# Patient Record
Sex: Male | Born: 1975 | Race: Black or African American | Hispanic: No | Marital: Married | Smoking: Current every day smoker
Health system: Southern US, Community
[De-identification: ages and names within clinical notes are randomized; demographics above are authoritative.]

## PROBLEM LIST (undated history)

## (undated) DIAGNOSIS — I10 Essential (primary) hypertension: Secondary | ICD-10-CM

## (undated) DIAGNOSIS — E119 Type 2 diabetes mellitus without complications: Secondary | ICD-10-CM

---

## 2017-01-02 ENCOUNTER — Emergency Department (HOSPITAL_COMMUNITY)
Admission: EM | Admit: 2017-01-02 | Discharge: 2017-01-02 | Disposition: A | Discharge status: Home / Self Care | Payer: Self-pay | Attending: Emergency Medicine | Admitting: Emergency Medicine

## 2017-01-02 ENCOUNTER — Encounter (HOSPITAL_COMMUNITY): Payer: Self-pay | Admitting: Emergency Medicine

## 2017-01-02 DIAGNOSIS — R51 Headache: Secondary | ICD-10-CM

## 2017-01-02 DIAGNOSIS — L0291 Cutaneous abscess, unspecified: Secondary | ICD-10-CM

## 2017-01-02 DIAGNOSIS — R030 Elevated blood-pressure reading, without diagnosis of hypertension: Secondary | ICD-10-CM

## 2017-01-02 DIAGNOSIS — J3489 Other specified disorders of nose and nasal sinuses: Secondary | ICD-10-CM | POA: Insufficient documentation

## 2017-01-02 DIAGNOSIS — R519 Headache, unspecified: Secondary | ICD-10-CM

## 2017-01-02 MED ORDER — OXYMETAZOLINE HCL 0.05 % NA SOLN
1.0000 | Freq: Once | NASAL | Status: AC
  Administered 2017-01-02: 1 via NASAL
  Filled 2017-01-02: qty 15

## 2017-01-02 MED ORDER — SULFAMETHOXAZOLE-TRIMETHOPRIM 800-160 MG PO TABS: 1.0000 | ORAL_TABLET | Freq: Two times a day (BID) | ORAL | 0 refills | Status: AC

## 2017-01-02 NOTE — Discharge Instructions (Signed)
Take the medication as directed.  Only use the nasal spray one spray in each nostril for 3 days. Follow up with [facility].  Return here as needed.

## 2017-01-02 NOTE — ED Triage Notes (Signed)
Pt here for nasal congestion and boils to knee

## 2017-01-02 NOTE — ED Provider Notes (Signed)
[facility]-EMERGENCY DEPT Provider Note   CSN: [id number] Arrival date & time: 01/02/17  4098   By signing my name below, I, Clarisse Gouge, attest that this documentation has been prepared under the direction and in the presence of [facility], FNP. Electronically Signed: Clarisse Gouge, Scribe. 01/02/17. 9:08 PM.   History   Chief Complaint Chief Complaint  Patient presents with  . Nasal Congestion   The history is provided by the patient and medical records. No language interpreter was used.    HPI Comments: Sean Foster is a 41 y.o. male who presents to the Emergency Department complaining of off and on left sided headache x 4 weeks. He notes associated sinus pain and pressure, nasal congestion, and nasal drainage. He notes he has taken tylenol, aleve and ibuprofen with temporary relief. Pt denies cough and back pain. No PCP noted.  Pt also notes recurring bumps to multiple areas on the body over the past 5 days. He reports bumps to the front of the left knee, outside of the right thigh, behind the right ear and on the left hip with drainage.  History reviewed. No pertinent past medical history.  There are no active problems to display for this patient.   History reviewed. No pertinent surgical history.     Home Medications    Prior to Admission medications   Medication Sig Start Date End Date Taking? Authorizing Provider  sulfamethoxazole-trimethoprim (BACTRIM DS,SEPTRA DS) 800-160 MG tablet Take 1 tablet by mouth 2 (two) times daily. 01/02/17 01/12/17  Dianelys Scinto Orlene Och, NP    Family History History reviewed. No pertinent family history.  Social History Social History  Substance Use Topics  . Smoking status: Never Smoker  . Smokeless tobacco: Never Used  . Alcohol use No     Allergies   Patient has no allergy information on record.   Review of Systems Review of Systems  Constitutional: Negative for chills and fever.  HENT: Positive for congestion, postnasal drip,  rhinorrhea, sinus pain and sinus pressure.   Eyes: Negative for redness.  Respiratory: Negative for cough.   Gastrointestinal: Negative for diarrhea, nausea and vomiting.  Genitourinary: Negative for dysuria.  Musculoskeletal: Negative for back pain.  Skin: Negative for rash.  Neurological: Positive for headaches.  Psychiatric/Behavioral: Negative for confusion.  All other systems reviewed and are negative.    Physical Exam Updated Vital Signs BP (!) 157/104 (BP Location: Left Arm)   Pulse 76   Temp 98 F (36.7 C) (Oral)   Resp 16   Ht 5\' 11"  (1.803 m)   Wt 117.9 kg   SpO2 100%   BMI 36.26 kg/m   Physical Exam  Constitutional: He is oriented to person, place, and time. Vital signs are normal. He appears well-developed and well-nourished.  Non-toxic appearance. No distress.  Afebrile, nontoxic, NAD  HENT:  Head: Normocephalic and atraumatic.  Right Ear: Tympanic membrane normal.  Left Ear: Tympanic membrane normal.  Nose: Mucosal edema and rhinorrhea present. Left sinus exhibits frontal sinus tenderness.  Mouth/Throat: Uvula is midline and mucous membranes are normal. No oropharyngeal exudate, posterior oropharyngeal edema or posterior oropharyngeal erythema.  Nasal mucosa is swollen with erythema noted.  Eyes: Conjunctivae and EOM are normal. Right eye exhibits no discharge. Left eye exhibits no discharge.  Neck: Normal range of motion. Neck supple. No Brudzinski's sign and no Kernig's sign noted.  FROM without pain. No meningeal signs  Cardiovascular: Normal rate, regular rhythm and intact distal pulses.   Pulses:  Radial pulses are 2+ on the right side, and 2+ on the left side.  Pulmonary/Chest: Effort normal. No respiratory distress. He has no wheezes. He has no rales.  Abdominal: Soft. Normal appearance. There is no tenderness.  Musculoskeletal: Normal range of motion.  Steady gait; No foot drag; Stands on one foot without issue; Negative Romberg; grip strength  equal  Neurological: He is alert and oriented to person, place, and time. He has normal strength.  Reflexes symmetric and normal.  Skin: Skin is warm, dry and intact. No rash noted.  Raised areas to the anterior left knee, left laterial thigh and the right hip; Small amount of drainage noted with crust surrounding to raised areas.  Psychiatric: He has a normal mood and affect. His behavior is normal.  Nursing note and vitals reviewed.    ED Treatments / Results  DIAGNOSTIC STUDIES: Oxygen Saturation is 100% on RA, normal by my interpretation.    COORDINATION OF CARE: 9:04 PM Discussed treatment plan with pt at bedside and pt agreed to plan. Will order medications. Pt advised to return if symptoms worsen or persist. Pt further advised to pursue a PCP.  Labs (all labs ordered are listed, but only abnormal results are displayed) Labs Reviewed - No data to display   Radiology No results found.  Procedures Procedures (including critical care time)  Medications Ordered in ED Medications  oxymetazoline (AFRIN) 0.05 % nasal spray 1 spray (1 spray Each Nare Given 01/02/17 2124)     Initial Impression / Assessment and Plan / ED Course  I have reviewed the triage vital signs and the nursing notes. I personally performed the services described in this documentation, which was scribed in my presence. The recorded information has been reviewed and is accurate.    Final Clinical Impressions(s) / ED Diagnoses  41 y.o. male with sinus headache left and multiple infected skin areas stable for d/c without fever and does not appear toxic. Appears stable for d/c. Return precautions given.  Encouraged to f/u with PCP for recheck of the areas and BP check.   Final diagnoses:  Sinus headache  Abscess  Elevated BP without diagnosis of hypertension    New Prescriptions Discharge Medication List as of 01/02/2017  9:14 PM    START taking these medications   Details  sulfamethoxazole-trimethoprim  (BACTRIM DS,SEPTRA DS) 800-160 MG tablet Take 1 tablet by mouth 2 (two) times daily., Starting Thu 01/02/2017, Until Sun 01/12/2017, Print         JewettHope M Fawaz Borquez, NP 01/03/17 [id number]    Shaune Pollackameron Isaacs, MD 01/05/17 [phone]

## 2017-06-25 ENCOUNTER — Emergency Department (HOSPITAL_COMMUNITY)
Admission: EM | Admit: 2017-06-25 | Discharge: 2017-06-25 | Disposition: A | Discharge status: Home / Self Care | Payer: No Typology Code available for payment source | Attending: Emergency Medicine | Admitting: Emergency Medicine

## 2017-06-25 ENCOUNTER — Emergency Department (HOSPITAL_COMMUNITY): Payer: No Typology Code available for payment source

## 2017-06-25 ENCOUNTER — Encounter (HOSPITAL_COMMUNITY): Payer: Self-pay | Admitting: Emergency Medicine

## 2017-06-25 DIAGNOSIS — T148XXA Other injury of unspecified body region, initial encounter: Secondary | ICD-10-CM | POA: Diagnosis not present

## 2017-06-25 DIAGNOSIS — S6991XA Unspecified injury of right wrist, hand and finger(s), initial encounter: Secondary | ICD-10-CM | POA: Diagnosis present

## 2017-06-25 DIAGNOSIS — Y92410 Unspecified street and highway as the place of occurrence of the external cause: Secondary | ICD-10-CM | POA: Insufficient documentation

## 2017-06-25 DIAGNOSIS — Y999 Unspecified external cause status: Secondary | ICD-10-CM | POA: Diagnosis not present

## 2017-06-25 DIAGNOSIS — R0789 Other chest pain: Secondary | ICD-10-CM | POA: Insufficient documentation

## 2017-06-25 DIAGNOSIS — Y9389 Activity, other specified: Secondary | ICD-10-CM | POA: Diagnosis not present

## 2017-06-25 DIAGNOSIS — R030 Elevated blood-pressure reading, without diagnosis of hypertension: Secondary | ICD-10-CM

## 2017-06-25 MED ORDER — MELOXICAM 15 MG PO TABS: 15.0000 mg | ORAL_TABLET | Freq: Every day | ORAL | 0 refills | Status: DC

## 2017-06-25 MED ORDER — BACLOFEN 10 MG PO TABS: 10.0000 mg | ORAL_TABLET | Freq: Three times a day (TID) | ORAL | 0 refills | Status: DC

## 2017-06-25 NOTE — Discharge Instructions (Signed)
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
## 2018-02-24 ENCOUNTER — Emergency Department (HOSPITAL_COMMUNITY)
Admission: EM | Admit: 2018-02-24 | Discharge: 2018-02-24 | Disposition: A | Discharge status: Home / Self Care | Payer: BLUE CROSS/BLUE SHIELD | Attending: Emergency Medicine | Admitting: Emergency Medicine

## 2018-02-24 ENCOUNTER — Encounter (HOSPITAL_COMMUNITY): Payer: Self-pay | Admitting: Emergency Medicine

## 2018-02-24 ENCOUNTER — Emergency Department (HOSPITAL_COMMUNITY): Payer: BLUE CROSS/BLUE SHIELD

## 2018-02-24 DIAGNOSIS — E109 Type 1 diabetes mellitus without complications: Secondary | ICD-10-CM | POA: Diagnosis not present

## 2018-02-24 DIAGNOSIS — Z7984 Long term (current) use of oral hypoglycemic drugs: Secondary | ICD-10-CM | POA: Insufficient documentation

## 2018-02-24 DIAGNOSIS — IMO0002 Reserved for concepts with insufficient information to code with codable children: Secondary | ICD-10-CM

## 2018-02-24 DIAGNOSIS — R51 Headache: Secondary | ICD-10-CM | POA: Diagnosis present

## 2018-02-24 DIAGNOSIS — J019 Acute sinusitis, unspecified: Secondary | ICD-10-CM | POA: Insufficient documentation

## 2018-02-24 DIAGNOSIS — E1065 Type 1 diabetes mellitus with hyperglycemia: Secondary | ICD-10-CM

## 2018-02-24 DIAGNOSIS — I10 Essential (primary) hypertension: Secondary | ICD-10-CM | POA: Diagnosis not present

## 2018-02-24 DIAGNOSIS — J329 Chronic sinusitis, unspecified: Secondary | ICD-10-CM

## 2018-02-24 DIAGNOSIS — Z79899 Other long term (current) drug therapy: Secondary | ICD-10-CM | POA: Insufficient documentation

## 2018-02-24 LAB — CBC WITH DIFFERENTIAL/PLATELET
Basophils Absolute: 0 10*3/uL (ref 0.0–0.1)
Basophils Relative: 0 %
Eosinophils Absolute: 0 10*3/uL (ref 0.0–0.7)
Eosinophils Relative: 1 %
HCT: 41.7 % (ref 39.0–52.0)
Hemoglobin: 13.3 g/dL (ref 13.0–17.0)
Lymphocytes Relative: 44 %
Lymphs Abs: 1.8 10*3/uL (ref 0.7–4.0)
MCH: 22.8 pg — ABNORMAL LOW (ref 26.0–34.0)
MCHC: 31.9 g/dL (ref 30.0–36.0)
MCV: 71.4 fL — ABNORMAL LOW (ref 78.0–100.0)
Monocytes Absolute: 0.4 10*3/uL (ref 0.1–1.0)
Monocytes Relative: 9 %
Neutro Abs: 2 10*3/uL (ref 1.7–7.7)
Neutrophils Relative %: 46 %
Platelets: 193 10*3/uL (ref 150–400)
RBC: 5.84 MIL/uL — ABNORMAL HIGH (ref 4.22–5.81)
RDW: 13.9 % (ref 11.5–15.5)
WBC: 4.2 10*3/uL (ref 4.0–10.5)

## 2018-02-24 LAB — BASIC METABOLIC PANEL
Anion gap: 7 (ref 5–15)
BUN: 13 mg/dL (ref 6–20)
CO2: 28 mmol/L (ref 22–32)
Calcium: 9.4 mg/dL (ref 8.9–10.3)
Chloride: 100 mmol/L — ABNORMAL LOW (ref 101–111)
Creatinine, Ser: 0.94 mg/dL (ref 0.61–1.24)
GFR calc Af Amer: >60 mL/min (ref >60)
GFR calc non Af Amer: >60 mL/min (ref >60)
Glucose, Bld: 245 mg/dL — ABNORMAL HIGH (ref 65–99)
Potassium: 4.4 mmol/L (ref 3.5–5.1)
Sodium: 135 mmol/L (ref 135–145)

## 2018-02-24 MED ORDER — AMLODIPINE BESYLATE 5 MG PO TABS: 5.0000 mg | ORAL_TABLET | Freq: Every day | ORAL | 1 refills | Status: DC

## 2018-02-24 MED ORDER — KETOROLAC TROMETHAMINE 15 MG/ML IJ SOLN
15.0000 mg | Freq: Once | INTRAMUSCULAR | Status: AC
  Administered 2018-02-24: 15 mg via INTRAVENOUS
  Filled 2018-02-24: qty 1

## 2018-02-24 MED ORDER — DIPHENHYDRAMINE HCL 50 MG/ML IJ SOLN
25.0000 mg | Freq: Once | INTRAMUSCULAR | Status: AC
  Administered 2018-02-24: 25 mg via INTRAVENOUS
  Filled 2018-02-24: qty 1

## 2018-02-24 MED ORDER — METOCLOPRAMIDE HCL 5 MG/ML IJ SOLN
10.0000 mg | Freq: Once | INTRAMUSCULAR | Status: AC
  Administered 2018-02-24: 10 mg via INTRAVENOUS
  Filled 2018-02-24: qty 2

## 2018-02-24 MED ORDER — METFORMIN HCL ER 750 MG PO TB24: 750.0000 mg | ORAL_TABLET | Freq: Every day | ORAL | 1 refills | Status: DC

## 2018-02-24 MED ORDER — SODIUM CHLORIDE 0.9 % IV BOLUS
1000.0000 mL | Freq: Once | INTRAVENOUS | Status: AC
  Administered 2018-02-24: 1000 mL via INTRAVENOUS

## 2018-02-24 NOTE — ED Notes (Signed)
Patient transported to CT 

## 2018-02-24 NOTE — ED Notes (Signed)
ED Provider at bedside. 

## 2018-02-24 NOTE — ED Triage Notes (Signed)
Pt reports for past 5 days has headache when he wakes up that will eventually go away but continues to come back. Reports intermittent blurred vision, denies n/v or being affected by sounds or lights.

## 2018-02-26 NOTE — ED Provider Notes (Signed)
[facility]-EMERGENCY DEPT Provider Note   CSN: [id number] Arrival date & time: 02/24/18  0816     History   Chief Complaint Chief Complaint  Patient presents with  . Headache    HPI Sean Foster is a 42 y.o. male.  HPI   42 year old male with headache.  Onset about 5 days ago.  Pain is intermittent but more intense frequent.  Pain is diffuse but worse in the frontal region.  Seasonally worse in the morning.  Will light up throughout the day.  No appreciable exacerbating relieving factors otherwise.  Mild blurred vision.  No recent trauma.  No fevers or chills.  No acute numbness, tingling or focal loss of strength.  No nausea or vomiting.  History reviewed. No pertinent past medical history.  There are no active problems to display for this patient.   History reviewed. No pertinent surgical history.      Home Medications    Prior to Admission medications   Medication Sig Start Date End Date Taking? Authorizing Provider  Naproxen Sodium (ALEVE PO) Take 2 tablets by mouth daily as needed (pain).   Yes [provider]  amLODipine (NORVASC) 5 MG tablet Take 1 tablet (5 mg total) by mouth daily. 02/24/18   Raeford RazorKohut, Elianah Karis, MD  baclofen (LIORESAL) 10 MG tablet Take 1 tablet (10 mg total) by mouth 3 (three) times daily. Patient not taking: Reported on 02/24/2018 06/25/17   Arthor CaptainHarris, Abigail, PA-C  meloxicam (MOBIC) 15 MG tablet Take 1 tablet (15 mg total) by mouth daily. Take 1 daily with food. Patient not taking: Reported on 02/24/2018 06/25/17   Arthor CaptainHarris, Abigail, PA-C  metFORMIN (GLUCOPHAGE-XR) 750 MG 24 hr tablet Take 1 tablet (750 mg total) by mouth daily with breakfast. 02/24/18   Raeford RazorKohut, Lleyton Byers, MD    Family History No family history on file.  Social History Social History   Tobacco Use  . Smoking status: Never Smoker  . Smokeless tobacco: Never Used  Substance Use Topics  . Alcohol use: Yes  . Drug use: No     Allergies    Chlorothen   Review of Systems Review of Systems All systems reviewed and negative, other than as noted in HPI.   Physical Exam Updated Vital Signs BP (!) 153/94   Pulse 64   Temp 98.5 F (36.9 C) (Oral)   Resp 16   Ht 5\' 11"  (1.803 m)   Wt 113.4 kg (250 lb)   SpO2 99%   BMI 34.87 kg/m   Physical Exam  Constitutional: He is oriented to person, place, and time. He appears well-developed and well-nourished. No distress.  HENT:  Head: Normocephalic and atraumatic.  Eyes: Conjunctivae are normal. Right eye exhibits no discharge. Left eye exhibits no discharge.  Neck: Neck supple.  Cardiovascular: Normal rate, regular rhythm and normal heart sounds. Exam reveals no gallop and no friction rub.  No murmur heard. Pulmonary/Chest: Effort normal and breath sounds normal. No respiratory distress.  Abdominal: Soft. He exhibits no distension. There is no tenderness.  Musculoskeletal: He exhibits no edema or tenderness.  Neurological: He is alert and oriented to person, place, and time.  Speech clear.  Content appropriate.  Follows commands.  Cranial nerves II through XII intact.  Strength is 5 out of 5 bilateral upper and lower extremities.  Good finger-nose testing bilaterally.  Neck is supple.  Skin: Skin is warm and dry.  Psychiatric: He has a normal mood and affect. His behavior is normal. Thought content normal.  Nursing note and vitals reviewed.    ED Treatments / Results  Labs (all labs ordered are listed, but only abnormal results are displayed) Labs Reviewed  CBC WITH DIFFERENTIAL/PLATELET - Abnormal; Notable for the following components:      Result Value   RBC 5.84 (*)    MCV 71.4 (*)    MCH 22.8 (*)    All other components within normal limits  BASIC METABOLIC PANEL - Abnormal; Notable for the following components:   Chloride 100 (*)    Glucose, Bld 245 (*)    All other components within normal limits    EKG None  Radiology No results  found.  Procedures Procedures (including critical care time)  Medications Ordered in ED Medications  sodium chloride 0.9 % bolus 1,000 mL (0 mLs Intravenous Stopped 02/24/18 1055)  ketorolac (TORADOL) 15 MG/ML injection 15 mg (15 mg Intravenous Given 02/24/18 0904)  metoCLOPramide (REGLAN) injection 10 mg (10 mg Intravenous Given 02/24/18 0903)  diphenhydrAMINE (BENADRYL) injection 25 mg (25 mg Intravenous Given 02/24/18 0904)     Initial Impression / Assessment and Plan / ED Course  I have reviewed the triage vital signs and the nursing notes.  Pertinent labs & imaging results that were available during my care of the patient were reviewed by me and considered in my medical decision making (see chart for details).     42 year old male with headache.  I doubt meningitis, head bleed, clot, or other emergent pathology.  Once a diabetic.  Will start on metformin.  Also noted to be hypertensive he has been told this several times previously.  We will start him on a low-dose of Norvasc.  Discussed need to establish PCP soon as he can.  Final Clinical Impressions(s) / ED Diagnoses   Final diagnoses:  Sinusitis, unspecified chronicity, unspecified location  New onset type 1 diabetes mellitus, uncontrolled (HCC)  Hypertension, unspecified type    ED Discharge Orders        Ordered    metFORMIN (GLUCOPHAGE-XR) 750 MG 24 hr tablet  Daily with breakfast     02/24/18 1039    amLODipine (NORVASC) 5 MG tablet  Daily     02/24/18 1039       Raeford Razor, MD 02/26/18 1721

## 2019-06-16 ENCOUNTER — Other Ambulatory Visit: Payer: Self-pay

## 2019-06-16 ENCOUNTER — Emergency Department (HOSPITAL_COMMUNITY)
Admission: EM | Admit: 2019-06-16 | Discharge: 2019-06-16 | Disposition: A | Discharge status: Home / Self Care | Payer: BLUE CROSS/BLUE SHIELD | Attending: Emergency Medicine | Admitting: Emergency Medicine

## 2019-06-16 ENCOUNTER — Encounter (HOSPITAL_COMMUNITY): Payer: Self-pay | Admitting: Emergency Medicine

## 2019-06-16 DIAGNOSIS — Z7984 Long term (current) use of oral hypoglycemic drugs: Secondary | ICD-10-CM | POA: Insufficient documentation

## 2019-06-16 DIAGNOSIS — E119 Type 2 diabetes mellitus without complications: Secondary | ICD-10-CM | POA: Diagnosis not present

## 2019-06-16 DIAGNOSIS — Z79899 Other long term (current) drug therapy: Secondary | ICD-10-CM | POA: Insufficient documentation

## 2019-06-16 DIAGNOSIS — R42 Dizziness and giddiness: Secondary | ICD-10-CM | POA: Diagnosis not present

## 2019-06-16 DIAGNOSIS — I10 Essential (primary) hypertension: Secondary | ICD-10-CM | POA: Insufficient documentation

## 2019-06-16 HISTORY — DX: Type 2 diabetes mellitus without complications: E11.9

## 2019-06-16 HISTORY — DX: Essential (primary) hypertension: I10

## 2019-06-16 LAB — CBG MONITORING, ED: Glucose-Capillary: 260 mg/dL — ABNORMAL HIGH (ref 70–99)

## 2019-06-16 LAB — CBC
HCT: 46.3 % (ref 39.0–52.0)
Hemoglobin: 13.9 g/dL (ref 13.0–17.0)
MCH: 21.8 pg — ABNORMAL LOW (ref 26.0–34.0)
MCHC: 30 g/dL (ref 30.0–36.0)
MCV: 72.7 fL — ABNORMAL LOW (ref 80.0–100.0)
Platelets: 216 10*3/uL (ref 150–400)
RBC: 6.37 MIL/uL — ABNORMAL HIGH (ref 4.22–5.81)
RDW: 14.9 % (ref 11.5–15.5)
WBC: 3.7 10*3/uL — ABNORMAL LOW (ref 4.0–10.5)
nRBC: 0 % (ref 0.0–0.2)

## 2019-06-16 LAB — BASIC METABOLIC PANEL
Anion gap: 9 (ref 5–15)
BUN: 13 mg/dL (ref 6–20)
CO2: 24 mmol/L (ref 22–32)
Calcium: 9.6 mg/dL (ref 8.9–10.3)
Chloride: 106 mmol/L (ref 98–111)
Creatinine, Ser: 0.8 mg/dL (ref 0.61–1.24)
GFR calc Af Amer: >60 mL/min (ref >60)
GFR calc non Af Amer: >60 mL/min (ref >60)
Glucose, Bld: 274 mg/dL — ABNORMAL HIGH (ref 70–99)
Potassium: 4 mmol/L (ref 3.5–5.1)
Sodium: 139 mmol/L (ref 135–145)

## 2019-06-16 MED ORDER — HYDROCHLOROTHIAZIDE 25 MG PO TABS: 25.0000 mg | ORAL_TABLET | Freq: Every day | ORAL | 1 refills | Status: DC

## 2019-06-16 MED ORDER — AMLODIPINE BESYLATE 5 MG PO TABS
5.0000 mg | ORAL_TABLET | Freq: Once | ORAL | Status: AC
  Administered 2019-06-16: 5 mg via ORAL
  Filled 2019-06-16: qty 1

## 2019-06-16 NOTE — ED Provider Notes (Signed)
[facility]   CSN: [id number] Arrival date & time: 06/16/19  0708     History   Chief Complaint Chief Complaint  Patient presents with  . Headache  . Dizziness    HPI Sean Foster is a 43 y.o. male.     Patient with history of hypertension and diabetes presents to the emergency department today with an elevated blood pressure reading.  Patient states that he woke up this morning and felt dizzy.  He denies feeling lightheaded or vertigo tenderness.  He states that when he stood up he had to sit back down for a moment.  After that time he was able to get up and use the restroom without any difficulty.  Patient checks his blood pressure after this incident which he found to be in the 180 systolic range. Patient called EMS who transported him to the hospital.  BP 222/130 per EMS.  by He reports having an intermittent headache over the past 3 days.  This is minor.  Patient denies signs of stroke including: facial droop, slurred speech, aphasia, weakness/numbness in extremities, imbalance/trouble walking.  Patient reports discontinuing previous metformin and blood pressure medication because he feels it decreases his sex drive.  He states that he also has concerns with erectile dysfunction.  He states that he saw an endocrinologist yesterday who did not restart any medications however referred him to a primary care doctor.  Patient states he does not want to take his previous blood pressure medication due to the side effects.      Past Medical History:  Diagnosis Date  . Diabetes mellitus without complication (HCC)   . Hypertension     There are no active problems to display for this patient.   History reviewed. No pertinent surgical history.      Home Medications    Prior to Admission medications   Medication Sig Start Date End Date Taking? Authorizing Provider  amLODipine (NORVASC) 5 MG tablet Take 1 tablet (5 mg total) by  mouth daily. 02/24/18   Raeford RazorKohut, Stephen, MD  baclofen (LIORESAL) 10 MG tablet Take 1 tablet (10 mg total) by mouth 3 (three) times daily. Patient not taking: Reported on 02/24/2018 06/25/17   Arthor CaptainHarris, Abigail, PA-C  meloxicam (MOBIC) 15 MG tablet Take 1 tablet (15 mg total) by mouth daily. Take 1 daily with food. Patient not taking: Reported on 02/24/2018 06/25/17   Arthor CaptainHarris, Abigail, PA-C  metFORMIN (GLUCOPHAGE-XR) 750 MG 24 hr tablet Take 1 tablet (750 mg total) by mouth daily with breakfast. 02/24/18   Raeford RazorKohut, Stephen, MD  Naproxen Sodium (ALEVE PO) Take 2 tablets by mouth daily as needed (pain).    [provider]    Family History History reviewed. No pertinent family history.  Social History Social History   Tobacco Use  . Smoking status: Never Smoker  . Smokeless tobacco: Never Used  Substance Use Topics  . Alcohol use: Yes    Comment: 1 drink per week  . Drug use: No     Allergies   Chlorothen   Review of Systems Review of Systems  Constitutional: Negative for diaphoresis and fever.  HENT: Negative for rhinorrhea and sore throat.   Eyes: Negative for redness.  Respiratory: Negative for cough and shortness of breath.   Cardiovascular: Negative for chest pain, palpitations and leg swelling.  Gastrointestinal: Negative for abdominal pain, diarrhea, nausea and vomiting.  Genitourinary: Negative for dysuria.  Musculoskeletal: Negative for back pain, myalgias and neck pain.  Skin: Negative for rash.  Neurological: Positive for dizziness and headaches. Negative for syncope and light-headedness.  Psychiatric/Behavioral: The patient is not nervous/anxious.      Physical Exam Updated Vital Signs BP (!) 181/111 (BP Location: Right Arm)   Pulse 70   Temp 98.2 F (36.8 C) (Oral)   Resp 10   Ht 5\' 10"  (1.778 m)   Wt 99.8 kg   SpO2 100%   BMI 31.57 kg/m   Physical Exam Vitals signs and nursing note reviewed.  Constitutional:      Appearance: He is well-developed. He is  not diaphoretic.  HENT:     Head: Normocephalic and atraumatic.     Right Ear: Tympanic membrane, ear canal and external ear normal.     Left Ear: Tympanic membrane, ear canal and external ear normal.     Nose: Nose normal.     Mouth/Throat:     Mouth: Mucous membranes are not dry.     Pharynx: Uvula midline.  Eyes:     General: Lids are normal.     Conjunctiva/sclera: Conjunctivae normal.     Pupils: Pupils are equal, round, and reactive to light.  Neck:     Musculoskeletal: Normal range of motion and neck supple. No muscular tenderness.     Vascular: Normal carotid pulses. No carotid bruit or JVD.     Trachea: Trachea normal. No tracheal deviation.  Cardiovascular:     Rate and Rhythm: Normal rate and regular rhythm.     Pulses: No decreased pulses.     Heart sounds: Normal heart sounds, S1 normal and S2 normal. Heart sounds not distant. No murmur.  Pulmonary:     Effort: Pulmonary effort is normal. No respiratory distress.     Breath sounds: Normal breath sounds. No wheezing.  Chest:     Chest wall: No tenderness.  Abdominal:     General: Bowel sounds are normal.     Palpations: Abdomen is soft.     Tenderness: There is no abdominal tenderness. There is no guarding or rebound.  Musculoskeletal: Normal range of motion.     Cervical back: He exhibits normal range of motion, no tenderness and no bony tenderness.  Skin:    General: Skin is warm and dry.     Coloration: Skin is not pale.  Neurological:     Mental Status: He is alert and oriented to person, place, and time.     GCS: GCS eye subscore is 4. GCS verbal subscore is 5. GCS motor subscore is 6.     Cranial Nerves: No cranial nerve deficit.     Sensory: No sensory deficit.     Motor: No abnormal muscle tone.     Coordination: Coordination normal.     Gait: Gait normal.     Deep Tendon Reflexes: Reflexes are normal and symmetric.      ED Treatments / Results  Labs (all labs ordered are listed, but only abnormal  results are displayed) Labs Reviewed  CBG MONITORING, ED - Abnormal; Notable for the following components:      Result Value   Glucose-Capillary 260 (*)    All other components within normal limits  CBC  BASIC METABOLIC PANEL    EKG EKG Interpretation  Date/Time:  Wednesday June 16 2019 07:22:47 EDT Ventricular Rate:  72 PR Interval:    QRS Duration: 100 QT Interval:  362 QTC Calculation: 397 R Axis:   3 Text Interpretation:  Sinus rhythm Borderline T wave abnormalities ST elevation,  consider anterior injury no prior ECG for comparison.  No STEMI Confirmed by Antony Blackbird [phone]) on 06/16/2019 7:26:18 AM   Radiology No results found.  Procedures Procedures (including critical care time)  Medications Ordered in ED Medications  amLODipine (NORVASC) tablet 5 mg (5 mg Oral Given 06/16/19 0759)     Initial Impression / Assessment and Plan / ED Course  I have reviewed the triage vital signs and the nursing notes.  Pertinent labs & imaging results that were available during my care of the patient were reviewed by me and considered in my medical decision making (see chart for details).        Patient seen and examined. EKG reviewed. Discussed with Dr. Sherry Ruffing.  Pt without chest pain or shortness of breath to suggest ischemia. Work-up initiated given dizziness and uncontrolled hypertension.  Will start blood pressure medication.  Patient states that he had previously seen a primary care doctor in [facility] however he is currently looking for a new provider.  Neuro exam is unremarkable at this time.  No concern for stroke.  No HA at this time.   Vital signs reviewed and are as follows: BP (!) 163/113   Pulse 74   Temp 98.2 F (36.8 C) (Oral)   Resp 15   Ht 5\' 10"  (1.778 m)   Wt 99.8 kg   SpO2 100%   BMI 31.57 kg/m   10:25 AM Repeat EKG unchanged.   Reviewed labs with patient. Will start on HCTZ and encouraged PCP follow-up. Referrals given.  Pt asked about erectile  dysfunction -- deferred to PCP as he may need cardiovascular work-up as well.  Patient is resistant to continuing metformin at this time.  Patient urged to return with worsening symptoms or other concerns. Patient verbalized understanding and agrees with plan.    Final Clinical Impressions(s) / ED Diagnoses   Final diagnoses:  Dizziness  Essential hypertension   Patient with brief dizzy spell today with normal neurological exam.  No stroke like symptoms.  He is mainly here today because of his blood pressures being so high.  He has been noncompliant with blood pressure medication due to side effects.  Patient thinks that he was taking amlodipine.  Will change to HCTZ today.  Patient without signs or symptoms of stroke, vision loss, chest pain, shortness of breath.  Creatinine is normal.  Do not suspect any significant acute endorgan damage from patient's hypertension.  He would benefit greatly from appropriate PCP follow-up and treatment of his chronic conditions.  Return instructions as above.  Patient does have an abnormal EKG that is unchanged during ED stay.  Suspect that he has chronic changes in his EKG due to his elevated blood pressure.  Doubt acute ischemia today.  ED Discharge Orders         Ordered    hydrochlorothiazide (HYDRODIURIL) 25 MG tablet  Daily     06/16/19 1015           Carlisle Cater, [place] 06/16/19 1029    Tegeler, Gwenyth Allegra, MD 06/16/19 [phone]

## 2019-06-16 NOTE — ED Triage Notes (Signed)
Pt reports waking this morning with right sided headache and dizziness. Hx of same when bp is high. Noncompliant with medications. cbg 334, 222/130, 180/120, hr 70, pt ekg with elevation in v1 v2 denies CP. Diabetic. Alert, oriented x4. 22g LFA.

## 2019-06-16 NOTE — Discharge Instructions (Signed)
Please read and follow all provided instructions.  Your diagnoses today include:  1. Dizziness   2. Essential hypertension     Tests performed today include:  Blood counts and electrolytes -are normal  Vital signs. See below for your results today.   Medications prescribed:   HCTZ - blood pressure medication  Take any prescribed medications only as directed.  Home care instructions:  Follow any educational materials contained in this packet.  BE VERY CAREFUL not to take multiple medicines containing Tylenol (also called acetaminophen). Doing so can lead to an overdose which can damage your liver and cause liver failure and possibly death.   Follow-up instructions: Please follow-up with your primary care provider in the next 3 days for further evaluation of your symptoms.  Please see PCP referrals.  Return instructions:   Please return to the Emergency Department if you experience worsening symptoms.   Return if you have weakness in your arms or legs, slurred speech, trouble walking or talking, confusion, or trouble with your balance.   Please return if you have any other emergent concerns.  Additional Information:  Your vital signs today were: BP (!) 169/109    Pulse 70    Temp 98.2 F (36.8 C) (Oral)    Resp 20    Ht 5\' 10"  (1.778 m)    Wt 99.8 kg    SpO2 99%    BMI 31.57 kg/m  If your blood pressure (BP) was elevated above 135/85 this visit, please have this repeated by your doctor within one month. --------------

## 2019-06-16 NOTE — ED Notes (Signed)
Pt's CBG result was 260. Informed Nikki - RN.

## 2020-03-21 ENCOUNTER — Encounter (HOSPITAL_COMMUNITY): Payer: Self-pay

## 2020-03-21 ENCOUNTER — Emergency Department (HOSPITAL_COMMUNITY): Payer: BLUE CROSS/BLUE SHIELD

## 2020-03-21 ENCOUNTER — Other Ambulatory Visit: Payer: Self-pay

## 2020-03-21 ENCOUNTER — Emergency Department (HOSPITAL_COMMUNITY)
Admission: EM | Admit: 2020-03-21 | Discharge: 2020-03-21 | Disposition: A | Discharge status: Home / Self Care | Payer: BLUE CROSS/BLUE SHIELD | Attending: Emergency Medicine | Admitting: Emergency Medicine

## 2020-03-21 DIAGNOSIS — Y92410 Unspecified street and highway as the place of occurrence of the external cause: Secondary | ICD-10-CM | POA: Insufficient documentation

## 2020-03-21 DIAGNOSIS — M25552 Pain in left hip: Secondary | ICD-10-CM | POA: Insufficient documentation

## 2020-03-21 DIAGNOSIS — Y999 Unspecified external cause status: Secondary | ICD-10-CM | POA: Diagnosis not present

## 2020-03-21 DIAGNOSIS — S7012XA Contusion of left thigh, initial encounter: Secondary | ICD-10-CM | POA: Diagnosis not present

## 2020-03-21 DIAGNOSIS — E119 Type 2 diabetes mellitus without complications: Secondary | ICD-10-CM | POA: Diagnosis not present

## 2020-03-21 DIAGNOSIS — S161XXA Strain of muscle, fascia and tendon at neck level, initial encounter: Secondary | ICD-10-CM | POA: Diagnosis not present

## 2020-03-21 DIAGNOSIS — Y939 Activity, unspecified: Secondary | ICD-10-CM | POA: Insufficient documentation

## 2020-03-21 DIAGNOSIS — I10 Essential (primary) hypertension: Secondary | ICD-10-CM | POA: Diagnosis not present

## 2020-03-21 DIAGNOSIS — S199XXA Unspecified injury of neck, initial encounter: Secondary | ICD-10-CM | POA: Diagnosis present

## 2020-03-21 LAB — CBC
HCT: 43.2 % (ref 39.0–52.0)
Hemoglobin: 13.2 g/dL (ref 13.0–17.0)
MCH: 22.3 pg — ABNORMAL LOW (ref 26.0–34.0)
MCHC: 30.6 g/dL (ref 30.0–36.0)
MCV: 73 fL — ABNORMAL LOW (ref 80.0–100.0)
Platelets: 320 10*3/uL (ref 150–400)
RBC: 5.92 MIL/uL — ABNORMAL HIGH (ref 4.22–5.81)
RDW: 14.1 % (ref 11.5–15.5)
WBC: 6.2 10*3/uL (ref 4.0–10.5)
nRBC: 0 % (ref 0.0–0.2)

## 2020-03-21 LAB — ETHANOL: Alcohol, Ethyl (B): <10 mg/dL (ref <10)

## 2020-03-21 LAB — I-STAT CHEM 8, ED
BUN: 17 mg/dL (ref 6–20)
Calcium, Ion: 1.13 mmol/L — ABNORMAL LOW (ref 1.15–1.40)
Chloride: 101 mmol/L (ref 98–111)
Creatinine, Ser: 0.9 mg/dL (ref 0.61–1.24)
Glucose, Bld: 319 mg/dL — ABNORMAL HIGH (ref 70–99)
HCT: 44 % (ref 39.0–52.0)
Hemoglobin: 15 g/dL (ref 13.0–17.0)
Potassium: 6.1 mmol/L — ABNORMAL HIGH (ref 3.5–5.1)
Sodium: 136 mmol/L (ref 135–145)
TCO2: 29 mmol/L (ref 22–32)

## 2020-03-21 LAB — COMPREHENSIVE METABOLIC PANEL
ALT: 11 U/L (ref 0–44)
AST: 32 U/L (ref 15–41)
Albumin: 3.8 g/dL (ref 3.5–5.0)
Alkaline Phosphatase: 187 U/L — ABNORMAL HIGH (ref 38–126)
Anion gap: 12 (ref 5–15)
BUN: 12 mg/dL (ref 6–20)
CO2: 22 mmol/L (ref 22–32)
Calcium: 9.1 mg/dL (ref 8.9–10.3)
Chloride: 100 mmol/L (ref 98–111)
Creatinine, Ser: 1.01 mg/dL (ref 0.61–1.24)
GFR calc Af Amer: >60 mL/min (ref >60)
GFR calc non Af Amer: >60 mL/min (ref >60)
Glucose, Bld: 325 mg/dL — ABNORMAL HIGH (ref 70–99)
Potassium: 5.5 mmol/L — ABNORMAL HIGH (ref 3.5–5.1)
Sodium: 134 mmol/L — ABNORMAL LOW (ref 135–145)
Total Bilirubin: 1.7 mg/dL — ABNORMAL HIGH (ref 0.3–1.2)
Total Protein: 7.6 g/dL (ref 6.5–8.1)

## 2020-03-21 LAB — PROTIME-INR
INR: 1 (ref 0.8–1.2)
Prothrombin Time: 12.9 seconds (ref 11.4–15.2)

## 2020-03-21 LAB — SAMPLE TO BLOOD BANK

## 2020-03-21 LAB — POTASSIUM: Potassium: 4.4 mmol/L (ref 3.5–5.1)

## 2020-03-21 MED ORDER — ONDANSETRON HCL 4 MG/2ML IJ SOLN: 4.0000 mg | Freq: Once | INTRAMUSCULAR | Status: DC

## 2020-03-21 MED ORDER — MORPHINE SULFATE (PF) 4 MG/ML IV SOLN
4.0000 mg | Freq: Once | INTRAVENOUS | Status: AC
  Administered 2020-03-21: 11:00:00 4 mg via INTRAVENOUS

## 2020-03-21 MED ORDER — IBUPROFEN 600 MG PO TABS: 600.0000 mg | ORAL_TABLET | Freq: Four times a day (QID) | ORAL | 0 refills | Status: DC | PRN

## 2020-03-21 MED ORDER — MORPHINE SULFATE (PF) 4 MG/ML IV SOLN
INTRAVENOUS | Status: AC
  Filled 2020-03-21: qty 1

## 2020-03-21 MED ORDER — CYCLOBENZAPRINE HCL 10 MG PO TABS
10.0000 mg | ORAL_TABLET | Freq: Two times a day (BID) | ORAL | 0 refills | Status: DC | PRN
Start: 2020-03-21 — End: 2024-02-17

## 2020-03-21 MED ORDER — SODIUM CHLORIDE 0.9 % IV BOLUS
10.0000 mL/kg | Freq: Once | INTRAVENOUS | Status: AC
  Administered 2020-03-21: 1000 mL via INTRAVENOUS

## 2020-03-21 MED ORDER — IOHEXOL 300 MG/ML  SOLN
100.0000 mL | Freq: Once | INTRAMUSCULAR | Status: AC | PRN
  Administered 2020-03-21: 100 mL via INTRAVENOUS

## 2020-03-21 NOTE — ED Notes (Signed)
Patient verbalizes understanding of discharge instructions. Opportunity for questioning and answers were provided. Armband removed by staff, pt discharged from ED.  
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
## 2020-03-21 NOTE — ED Notes (Signed)
Help get patient on the monitor did manual blood pressure patient is resting with nurse at bedside
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
## 2020-03-22 ENCOUNTER — Encounter (HOSPITAL_COMMUNITY): Payer: Self-pay | Admitting: Emergency Medicine

## 2020-04-16 ENCOUNTER — Encounter (HOSPITAL_COMMUNITY)
Admission: EM | Disposition: A | Discharge status: Home / Self Care | Payer: Self-pay | Source: Home / Self Care | Attending: Emergency Medicine

## 2020-04-16 ENCOUNTER — Observation Stay (HOSPITAL_COMMUNITY): Payer: BLUE CROSS/BLUE SHIELD | Admitting: Certified Registered Nurse Anesthetist

## 2020-04-16 ENCOUNTER — Encounter (HOSPITAL_COMMUNITY): Payer: Self-pay

## 2020-04-16 ENCOUNTER — Other Ambulatory Visit: Payer: Self-pay

## 2020-04-16 ENCOUNTER — Observation Stay (HOSPITAL_COMMUNITY)
Admission: EM | Admit: 2020-04-16 | Discharge: 2020-04-17 | Disposition: A | Discharge status: Home / Self Care | Payer: BLUE CROSS/BLUE SHIELD | Attending: Surgery | Admitting: Surgery

## 2020-04-16 ENCOUNTER — Emergency Department (HOSPITAL_COMMUNITY): Payer: BLUE CROSS/BLUE SHIELD

## 2020-04-16 DIAGNOSIS — L0231 Cutaneous abscess of buttock: Secondary | ICD-10-CM | POA: Diagnosis present

## 2020-04-16 DIAGNOSIS — E1165 Type 2 diabetes mellitus with hyperglycemia: Secondary | ICD-10-CM | POA: Diagnosis not present

## 2020-04-16 DIAGNOSIS — Z20822 Contact with and (suspected) exposure to covid-19: Secondary | ICD-10-CM | POA: Diagnosis not present

## 2020-04-16 DIAGNOSIS — I10 Essential (primary) hypertension: Secondary | ICD-10-CM | POA: Diagnosis not present

## 2020-04-16 DIAGNOSIS — Z79899 Other long term (current) drug therapy: Secondary | ICD-10-CM | POA: Insufficient documentation

## 2020-04-16 DIAGNOSIS — L03317 Cellulitis of buttock: Secondary | ICD-10-CM | POA: Diagnosis present

## 2020-04-16 DIAGNOSIS — Z888 Allergy status to other drugs, medicaments and biological substances status: Secondary | ICD-10-CM | POA: Diagnosis not present

## 2020-04-16 DIAGNOSIS — E119 Type 2 diabetes mellitus without complications: Secondary | ICD-10-CM

## 2020-04-16 HISTORY — PX: INCISION AND DRAINAGE ABSCESS: SHX5864

## 2020-04-16 LAB — CBC WITH DIFFERENTIAL/PLATELET
Abs Immature Granulocytes: 0.03 10*3/uL (ref 0.00–0.07)
Basophils Absolute: 0 10*3/uL (ref 0.0–0.1)
Basophils Relative: 0 %
Eosinophils Absolute: 0.1 10*3/uL (ref 0.0–0.5)
Eosinophils Relative: 1 %
HCT: 40.1 % (ref 39.0–52.0)
Hemoglobin: 11.8 g/dL — ABNORMAL LOW (ref 13.0–17.0)
Immature Granulocytes: 1 %
Lymphocytes Relative: 29 %
Lymphs Abs: 1.7 10*3/uL (ref 0.7–4.0)
MCH: 21.7 pg — ABNORMAL LOW (ref 26.0–34.0)
MCHC: 29.4 g/dL — ABNORMAL LOW (ref 30.0–36.0)
MCV: 73.6 fL — ABNORMAL LOW (ref 80.0–100.0)
Monocytes Absolute: 0.5 10*3/uL (ref 0.1–1.0)
Monocytes Relative: 9 %
Neutro Abs: 3.6 10*3/uL (ref 1.7–7.7)
Neutrophils Relative %: 60 %
Platelets: 250 10*3/uL (ref 150–400)
RBC: 5.45 MIL/uL (ref 4.22–5.81)
RDW: 13.8 % (ref 11.5–15.5)
WBC: 5.9 10*3/uL (ref 4.0–10.5)
nRBC: 0 % (ref 0.0–0.2)

## 2020-04-16 LAB — BASIC METABOLIC PANEL
Anion gap: 9 (ref 5–15)
BUN: 9 mg/dL (ref 6–20)
CO2: 27 mmol/L (ref 22–32)
Calcium: 8.9 mg/dL (ref 8.9–10.3)
Chloride: 100 mmol/L (ref 98–111)
Creatinine, Ser: 0.78 mg/dL (ref 0.61–1.24)
GFR calc Af Amer: >60 mL/min (ref >60)
GFR calc non Af Amer: >60 mL/min (ref >60)
Glucose, Bld: 321 mg/dL — ABNORMAL HIGH (ref 70–99)
Potassium: 4.3 mmol/L (ref 3.5–5.1)
Sodium: 136 mmol/L (ref 135–145)

## 2020-04-16 LAB — LACTIC ACID, PLASMA: Lactic Acid, Venous: 1.6 mmol/L (ref 0.5–1.9)

## 2020-04-16 LAB — GLUCOSE, CAPILLARY: Glucose-Capillary: 201 mg/dL — ABNORMAL HIGH (ref 70–99)

## 2020-04-16 LAB — SARS CORONAVIRUS 2 BY RT PCR (HOSPITAL ORDER, PERFORMED IN ~~LOC~~ HOSPITAL LAB): SARS Coronavirus 2: NEGATIVE

## 2020-04-16 SURGERY — INCISION AND DRAINAGE, ABSCESS
Anesthesia: General | Laterality: Left

## 2020-04-16 MED ORDER — ONDANSETRON 4 MG PO TBDP: 4.0000 mg | ORAL_TABLET | Freq: Four times a day (QID) | ORAL | Status: DC | PRN

## 2020-04-16 MED ORDER — LACTATED RINGERS IV SOLN: INTRAVENOUS | Status: DC | PRN

## 2020-04-16 MED ORDER — ONDANSETRON HCL 4 MG/2ML IJ SOLN
INTRAMUSCULAR | Status: DC | PRN
  Administered 2020-04-16: 4 mg via INTRAVENOUS

## 2020-04-16 MED ORDER — 0.9 % SODIUM CHLORIDE (POUR BTL) OPTIME
TOPICAL | Status: DC | PRN
  Administered 2020-04-16: 1000 mL

## 2020-04-16 MED ORDER — FENTANYL CITRATE (PF) 100 MCG/2ML IJ SOLN
25.0000 ug | INTRAMUSCULAR | Status: DC | PRN
  Administered 2020-04-16 (×2): 25 ug via INTRAVENOUS
  Administered 2020-04-16: 50 ug via INTRAVENOUS
  Administered 2020-04-16 (×2): 25 ug via INTRAVENOUS

## 2020-04-16 MED ORDER — ACETAMINOPHEN 325 MG PO TABS: 650.0000 mg | ORAL_TABLET | Freq: Four times a day (QID) | ORAL | Status: DC | PRN

## 2020-04-16 MED ORDER — ONDANSETRON HCL 4 MG/2ML IJ SOLN
INTRAMUSCULAR | Status: AC
  Filled 2020-04-16: qty 2

## 2020-04-16 MED ORDER — SUCCINYLCHOLINE CHLORIDE 200 MG/10ML IV SOSY
PREFILLED_SYRINGE | INTRAVENOUS | Status: AC
  Filled 2020-04-16: qty 10

## 2020-04-16 MED ORDER — ACETAMINOPHEN 325 MG PO TABS
650.0000 mg | ORAL_TABLET | Freq: Once | ORAL | Status: AC
  Administered 2020-04-16: 650 mg via ORAL
  Filled 2020-04-16: qty 2

## 2020-04-16 MED ORDER — LIDOCAINE HCL (CARDIAC) PF 100 MG/5ML IV SOSY
PREFILLED_SYRINGE | INTRAVENOUS | Status: DC | PRN
  Administered 2020-04-16: 100 mg via INTRATRACHEAL

## 2020-04-16 MED ORDER — SUGAMMADEX SODIUM 500 MG/5ML IV SOLN
INTRAVENOUS | Status: AC
  Filled 2020-04-16: qty 5

## 2020-04-16 MED ORDER — VANCOMYCIN HCL 2000 MG/400ML IV SOLN
2000.0000 mg | Freq: Once | INTRAVENOUS | Status: AC
  Administered 2020-04-16: 2000 mg via INTRAVENOUS
  Filled 2020-04-16: qty 400

## 2020-04-16 MED ORDER — IOHEXOL 300 MG/ML  SOLN
100.0000 mL | Freq: Once | INTRAMUSCULAR | Status: AC | PRN
  Administered 2020-04-16: 100 mL via INTRAVENOUS

## 2020-04-16 MED ORDER — FENTANYL CITRATE (PF) 100 MCG/2ML IJ SOLN
INTRAMUSCULAR | Status: AC
  Filled 2020-04-16: qty 2

## 2020-04-16 MED ORDER — LIDOCAINE 2% (20 MG/ML) 5 ML SYRINGE
INTRAMUSCULAR | Status: AC
  Filled 2020-04-16: qty 5

## 2020-04-16 MED ORDER — ROCURONIUM BROMIDE 10 MG/ML (PF) SYRINGE
PREFILLED_SYRINGE | INTRAVENOUS | Status: AC
  Filled 2020-04-16: qty 10

## 2020-04-16 MED ORDER — ACETAMINOPHEN 650 MG RE SUPP: 650.0000 mg | Freq: Four times a day (QID) | RECTAL | Status: DC | PRN

## 2020-04-16 MED ORDER — SUCCINYLCHOLINE CHLORIDE 200 MG/10ML IV SOSY
PREFILLED_SYRINGE | INTRAVENOUS | Status: DC | PRN
  Administered 2020-04-16: 140 mg via INTRAVENOUS

## 2020-04-16 MED ORDER — MIDAZOLAM HCL 2 MG/2ML IJ SOLN
INTRAMUSCULAR | Status: AC
  Filled 2020-04-16: qty 2

## 2020-04-16 MED ORDER — MIDAZOLAM HCL 5 MG/5ML IJ SOLN
INTRAMUSCULAR | Status: DC | PRN
  Administered 2020-04-16: 2 mg via INTRAVENOUS

## 2020-04-16 MED ORDER — BUPIVACAINE-EPINEPHRINE 0.25% -1:200000 IJ SOLN
INTRAMUSCULAR | Status: AC
  Filled 2020-04-16: qty 1

## 2020-04-16 MED ORDER — PIPERACILLIN-TAZOBACTAM 3.375 G IVPB 30 MIN
3.3750 g | Freq: Once | INTRAVENOUS | Status: AC
  Administered 2020-04-16: 3.375 g via INTRAVENOUS
  Filled 2020-04-16: qty 50

## 2020-04-16 MED ORDER — INSULIN ASPART 100 UNIT/ML ~~LOC~~ SOLN
0.0000 [IU] | SUBCUTANEOUS | Status: DC
  Administered 2020-04-17: 3 [IU] via SUBCUTANEOUS
  Administered 2020-04-17: 8 [IU] via SUBCUTANEOUS
  Administered 2020-04-17: 5 [IU] via SUBCUTANEOUS
  Administered 2020-04-17: 15 [IU] via SUBCUTANEOUS

## 2020-04-16 MED ORDER — HYDROCHLOROTHIAZIDE 12.5 MG PO CAPS
25.0000 mg | ORAL_CAPSULE | Freq: Once | ORAL | Status: AC
  Administered 2020-04-16: 25 mg via ORAL
  Filled 2020-04-16: qty 2

## 2020-04-16 MED ORDER — SUGAMMADEX SODIUM 500 MG/5ML IV SOLN
INTRAVENOUS | Status: DC | PRN
  Administered 2020-04-16: 400 mg via INTRAVENOUS

## 2020-04-16 MED ORDER — FENTANYL CITRATE (PF) 100 MCG/2ML IJ SOLN
INTRAMUSCULAR | Status: DC | PRN
  Administered 2020-04-16: 100 ug via INTRAVENOUS

## 2020-04-16 MED ORDER — PROPOFOL 10 MG/ML IV BOLUS
INTRAVENOUS | Status: AC
  Filled 2020-04-16: qty 20

## 2020-04-16 MED ORDER — TRAMADOL HCL 50 MG PO TABS: 50.0000 mg | ORAL_TABLET | Freq: Four times a day (QID) | ORAL | Status: DC | PRN

## 2020-04-16 MED ORDER — ONDANSETRON HCL 4 MG/2ML IJ SOLN: 4.0000 mg | Freq: Four times a day (QID) | INTRAMUSCULAR | Status: DC | PRN

## 2020-04-16 MED ORDER — VANCOMYCIN HCL IN DEXTROSE 1-5 GM/200ML-% IV SOLN
1000.0000 mg | Freq: Three times a day (TID) | INTRAVENOUS | Status: DC
  Administered 2020-04-16: 1000 mg via INTRAVENOUS
  Filled 2020-04-16 (×3): qty 200

## 2020-04-16 MED ORDER — SODIUM CHLORIDE 0.45 % IV SOLN: INTRAVENOUS | Status: DC

## 2020-04-16 MED ORDER — OXYCODONE HCL 5 MG PO TABS
5.0000 mg | ORAL_TABLET | ORAL | Status: DC | PRN
  Administered 2020-04-17: 5 mg via ORAL
  Filled 2020-04-16: qty 2

## 2020-04-16 MED ORDER — HYDROMORPHONE HCL 1 MG/ML IJ SOLN: 1.0000 mg | INTRAMUSCULAR | Status: DC | PRN

## 2020-04-16 MED ORDER — PIPERACILLIN-TAZOBACTAM 3.375 G IVPB
3.3750 g | Freq: Three times a day (TID) | INTRAVENOUS | Status: DC
  Administered 2020-04-16: 3.375 g via INTRAVENOUS
  Filled 2020-04-16: qty 50

## 2020-04-16 MED ORDER — PROPOFOL 10 MG/ML IV BOLUS
INTRAVENOUS | Status: DC | PRN
  Administered 2020-04-16: 200 mg via INTRAVENOUS

## 2020-04-16 MED ORDER — SODIUM CHLORIDE (PF) 0.9 % IJ SOLN
INTRAMUSCULAR | Status: AC
  Filled 2020-04-16: qty 50

## 2020-04-16 MED ORDER — ROCURONIUM BROMIDE 10 MG/ML (PF) SYRINGE
PREFILLED_SYRINGE | INTRAVENOUS | Status: DC | PRN
  Administered 2020-04-16: 20 mg via INTRAVENOUS

## 2020-04-16 SURGICAL SUPPLY — 28 items
BLADE SURG 15 STRL LF DISP TIS (BLADE) ×1 IMPLANT
BLADE SURG 15 STRL SS (BLADE) ×2
BNDG GAUZE ELAST 4 BULKY (GAUZE/BANDAGES/DRESSINGS) ×3 IMPLANT
CHLORAPREP W/TINT 26 (MISCELLANEOUS) IMPLANT
COVER WAND RF STERILE (DRAPES) IMPLANT
DECANTER SPIKE VIAL GLASS SM (MISCELLANEOUS) IMPLANT
DRAPE LAPAROSCOPIC ABDOMINAL (DRAPES) ×3 IMPLANT
DRSG PAD ABDOMINAL 8X10 ST (GAUZE/BANDAGES/DRESSINGS) ×3 IMPLANT
ELECT REM PT RETURN 15FT ADLT (MISCELLANEOUS) ×3 IMPLANT
GAUZE SPONGE 4X4 12PLY STRL (GAUZE/BANDAGES/DRESSINGS) ×3 IMPLANT
GLOVE BIO SURGEON STRL SZ7.5 (GLOVE) ×3 IMPLANT
GOWN STRL REUS W/TWL LRG LVL3 (GOWN DISPOSABLE) ×6 IMPLANT
KIT BASIN (CUSTOM PROCEDURE TRAY) ×3 IMPLANT
KIT TURNOVER KIT A (KITS) IMPLANT
NEEDLE HYPO 25X1 1.5 SAFETY (NEEDLE) ×3 IMPLANT
NS IRRIG 1000ML POUR BTL (IV SOLUTION) ×3 IMPLANT
PENCIL SMOKE EVACUATOR (MISCELLANEOUS) IMPLANT
SPONGE LAP 18X18 RF (DISPOSABLE) ×3 IMPLANT
SUT MNCRL AB 4-0 PS2 18 (SUTURE) IMPLANT
SUT VIC AB 3-0 SH 27 (SUTURE)
SUT VIC AB 3-0 SH 27XBRD (SUTURE) IMPLANT
SWAB COLLECTION DEVICE MRSA (MISCELLANEOUS) ×3 IMPLANT
SWAB CULTURE ESWAB REG 1ML (MISCELLANEOUS) ×3 IMPLANT
SYR BULB EAR ULCER 3OZ GRN STR (SYRINGE) IMPLANT
SYR CONTROL 10ML LL (SYRINGE) IMPLANT
TAPE CLOTH SURG 6X10 WHT LF (GAUZE/BANDAGES/DRESSINGS) ×3 IMPLANT
TOWEL OR 17X26 10 PK STRL BLUE (TOWEL DISPOSABLE) ×3 IMPLANT
YANKAUER SUCT BULB TIP NO VENT (SUCTIONS) ×3 IMPLANT

## 2020-04-16 NOTE — Anesthesia Procedure Notes (Signed)
Procedure Name: Intubation Date/Time: 04/16/2020 5:10 PM Performed by: Lind Covert, CRNA Pre-anesthesia Checklist: Patient identified, Emergency Drugs available, Suction available, Patient being monitored and Timeout performed Patient Re-evaluated:Patient Re-evaluated prior to induction Oxygen Delivery Method: Circle system utilized Preoxygenation: Pre-oxygenation with 100% oxygen Induction Type: IV induction Laryngoscope Size: Mac and 4 Grade View: Grade I Tube type: Oral Tube size: 7.5 mm Number of attempts: 1 Airway Equipment and Method: Stylet Placement Confirmation: ETT inserted through vocal cords under direct vision,  positive ETCO2 and breath sounds checked- equal and bilateral Secured at: 22 cm Tube secured with: Tape Dental Injury: Teeth and Oropharynx as per pre-operative assessment

## 2020-04-16 NOTE — ED Provider Notes (Signed)
[facility]-EMERGENCY DEPT Provider Note   CSN: [id number] Arrival date & time: 04/16/20  0847     History Chief Complaint  Patient presents with  . Abscess    Sean Foster is a 44 y.o. male with a past medical history significant for diabetes and hypertension who presents to the ED due to an abscess on his left buttocks that has progressively gotten worse over the past month.  Patient notes he had 3 "bumps" on his left buttocks in which 2 of them began to drain however one continued to grow in size. History of abscesses before. No history of I&D. Denies fever and chills. He admits to mild drainage from the site which has completely stopped. He has tried Tylenol and a warm compress with no relief. Rates his pain a 9/10 worse with palpation and sitting. Denies difficulties with bowel movements. Last BM yesterday which was normal. Denies urinary symptoms. Denies abdominal pain.  History obtained from patient and past medical records. No interpreter used during encounter.      Past Medical History:  Diagnosis Date  . Diabetes mellitus without complication (HCC)   . Hypertension     Patient Active Problem List   Diagnosis Date Noted  . Left buttock abscess 04/16/2020  . Diabetes (HCC) 04/16/2020  . Cellulitis of left buttock 04/16/2020    History reviewed. No pertinent surgical history.     History reviewed. No pertinent family history.  Social History   Tobacco Use  . Smoking status: Never Smoker  . Smokeless tobacco: Never Used  Substance Use Topics  . Alcohol use: Not Currently    Comment: 1 drink per week  . Drug use: Not Currently    Home Medications Prior to Admission medications   Medication Sig Start Date End Date Taking? Authorizing Provider  acetaminophen (TYLENOL) 500 MG tablet Take 1,000 mg by mouth every 6 (six) hours as needed for mild pain or headache.   Yes [provider]  cyclobenzaprine (FLEXERIL) 10 MG tablet Take 1  tablet (10 mg total) by mouth 2 (two) times daily as needed for muscle spasms. 03/21/20  Yes Linwood Dibbles, MD  hydrochlorothiazide (HYDRODIURIL) 25 MG tablet Take 1 tablet (25 mg total) by mouth daily. 06/16/19  Yes Renne Crigler, PA-C  ibuprofen (IBU) 600 MG tablet Take 1 tablet (600 mg total) by mouth every 6 (six) hours as needed. 03/21/20  Yes Linwood Dibbles, MD  amLODipine (NORVASC) 5 MG tablet Take 1 tablet (5 mg total) by mouth daily. Patient not taking: Reported on 06/16/2019 02/24/18 06/16/19  Raeford Razor, MD  metFORMIN (GLUCOPHAGE-XR) 750 MG 24 hr tablet Take 1 tablet (750 mg total) by mouth daily with breakfast. Patient not taking: Reported on 06/16/2019 02/24/18 06/16/19  Raeford Razor, MD    Allergies    Chloroquine, Chloroquine, Chlorothen, and Chlorothen  Review of Systems   Review of Systems  Constitutional: Negative for chills and fever.  Gastrointestinal: Negative for abdominal pain, diarrhea, nausea, rectal pain and vomiting.  Genitourinary: Negative for dysuria.  Skin: Positive for color change and wound (abscess).  All other systems reviewed and are negative.   Physical Exam Updated Vital Signs BP (!) 156/97   Pulse 78   Temp 97.6 F (36.4 C) (Oral)   Resp 18   Ht 5\' 10"  (1.778 m)   Wt 100 kg   SpO2 100%   BMI 31.63 kg/m   Physical Exam Vitals and nursing note reviewed.  Constitutional:      General:  He is not in acute distress.    Appearance: He is not toxic-appearing.  HENT:     Head: Normocephalic.  Eyes:     Pupils: Pupils are equal, round, and reactive to light.  Cardiovascular:     Rate and Rhythm: Normal rate and regular rhythm.     Pulses: Normal pulses.     Heart sounds: Normal heart sounds. No murmur. No friction rub. No gallop.   Pulmonary:     Effort: Pulmonary effort is normal.     Breath sounds: Normal breath sounds.  Abdominal:     General: Abdomen is flat. Bowel sounds are normal. There is no distension.     Palpations: Abdomen is soft.      Tenderness: There is no abdominal tenderness. There is no guarding or rebound.  Musculoskeletal:     Cervical back: Neck supple.     Comments: Able to move all 4 extremities without difficulty.   Skin:    Comments: 12cmx12cm area of induration over entire left buttocks. No rectal or perineum involvement.  Neurological:     General: No focal deficit present.     Mental Status: He is alert.  Psychiatric:        Mood and Affect: Mood normal.        Behavior: Behavior normal.     ED Results / Procedures / Treatments   Labs (all labs ordered are listed, but only abnormal results are displayed) Labs Reviewed  CBC WITH DIFFERENTIAL/PLATELET - Abnormal; Notable for the following components:      Result Value   Hemoglobin 11.8 (*)    MCV 73.6 (*)    MCH 21.7 (*)    MCHC 29.4 (*)    All other components within normal limits  BASIC METABOLIC PANEL - Abnormal; Notable for the following components:   Glucose, Bld 321 (*)    All other components within normal limits  SARS CORONAVIRUS 2 BY RT PCR (HOSPITAL ORDER, PERFORMED IN [facility])  CULTURE, BLOOD (ROUTINE X 2)  CULTURE, BLOOD (ROUTINE X 2)  LACTIC ACID, PLASMA  HIV ANTIBODY (ROUTINE TESTING W REFLEX)    EKG None  Radiology CT PELVIS W CONTRAST  Result Date: 04/16/2020 CLINICAL DATA:  Unresolving boil over the left buttocks for 1 month. Persistent pain. EXAM: CT PELVIS WITH CONTRAST TECHNIQUE: Multidetector CT imaging of the pelvis was performed using the standard protocol following the bolus administration of intravenous contrast. CONTRAST:  OMNIPAQUE IOHEXOL 300 MG/ML  SOLN COMPARISON:  Abdominopelvic CT 03/21/2020. FINDINGS: Study was performed with the patient prone. Urinary Tract: The visualized distal ureters and bladder appear unremarkable. Bowel: No bowel wall thickening, distention or surrounding inflammation identified within the pelvis. The pelvis appears normal. Vascular/Lymphatic: No enlarged pelvic  lymph nodes identified. Minimal iliac atherosclerosis. No acute vascular findings. Reproductive: The prostate gland and seminal vesicles appear normal. Other: No ascites or free intraperitoneal air. No intrapelvic inflammatory changes or fluid collections are seen. Musculoskeletal: There are inflammatory changes within the subcutaneous fat of the left buttocks. There is an ill-defined peripherally enhancing fluid collection superficial to the gluteus maximus muscle which measures 5.4 x 3.6 cm on image 94/3, suspicious for an abscess. This extends approximately 5.7 cm in height on coronal image 152/8. No associated foreign body or soft tissue emphysema. This is approximately 8 cm lateral to the intergluteal crease, and no involvement of the perianal soft tissues or musculature identified. No hip joint effusion or evidence of osteomyelitis. Chronic posterior subluxation of  the coccyx. IMPRESSION: 1. Ill-defined peripherally enhancing fluid collection superficial to the left gluteus maximus muscle, suspicious for an abscess. No associated foreign body or soft tissue emphysema. 2. No evidence of perianal inflammation or osteomyelitis. Electronically Signed   By: Richardean Sale M.D.   On: 04/16/2020 12:44    Procedures Procedures (including critical care time)  Medications Ordered in ED Medications  sodium chloride (PF) 0.9 % injection (has no administration in time range)  vancomycin (VANCOREADY) IVPB 2000 mg/400 mL (2,000 mg Intravenous New Bag/Given 04/16/20 1435)  hydrochlorothiazide (MICROZIDE) capsule 25 mg (25 mg Oral Given 04/16/20 1019)  acetaminophen (TYLENOL) tablet 650 mg (650 mg Oral Given 04/16/20 1019)  iohexol (OMNIPAQUE) 300 MG/ML solution 100 mL (100 mLs Intravenous Contrast Given 04/16/20 1159)  piperacillin-tazobactam (ZOSYN) IVPB 3.375 g (0 g Intravenous Stopped 04/16/20 1436)    ED Course  I have reviewed the triage vital signs and the nursing notes.  Pertinent labs & imaging results  that were available during my care of the patient were reviewed by me and considered in my medical decision making (see chart for details).  Clinical Course as of Apr 17 1615  Sun Apr 16, 2020  0956 Is complaining of pain to his left buttock is been going on over a month.  It intermittently drains purulent material.  No known fever.  On exam is got a markedly enlarged tender mass in the buttock likely abscess.  Will need labs and a CT.  Probable surgery consult.   [MB]  1312 Discussed case with Dr. Harlow Asa with general surgery who will evaluate patient at bedside and most likely take patient to the OR for I&D. IV antibiotics started. Patient last drank tea at 8am this morning and a little sip of water with tylenol here in the ED.    [CA]    Clinical Course User Index [CA] Suzy Bouchard, PA-C [MB] Hayden Rasmussen, MD   MDM Rules/Calculators/A&P                     44 year old male presents the ED due to an abscess located on his left buttocks that has progressively grown in size over the past month.  Patient is afebrile, not tachycardic or hypoxic.  No concern for sepsis at this time.  Patient's BP elevated at 192/123 however he denies headache, chest pain, and blurry vision.  He notes he did not take his BP medicine this morning due to pain.  Suspect blood pressure elevated due to pain and noncompliance with medication.  12 x 12 cm area of induration encompassing entire left buttocks.  No rectal or perineum involvement.  Will obtain routine labs and CT pelvis to evaluate abscess more given the size. Tylenol given for pain management. Discussed case with Dr. Melina Copa who evaluated patient at bedside and agrees with assessment and plan. Patient will most likely need surgery consult for I&D given size.   Reassuring with no leukocytosis.  Lactic acid normal at 1.6.  BMP significant for hyperglycemia 321 with no anion gap.  No concern for DKA at this time.  Covid test negative. CT personally  reviewed which demonstrates: IMPRESSION:  1. Ill-defined peripherally enhancing fluid collection superficial  to the left gluteus maximus muscle, suspicious for an abscess. No  associated foreign body or soft tissue emphysema.  2. No evidence of perianal inflammation or osteomyelitis.   There are inflammatory changes within the  subcutaneous fat of the left buttocks. There is an ill-defined  peripherally  enhancing fluid collection superficial to the gluteus  maximus muscle which measures 5.4 x 3.6 cm on image 94/3, suspicious  for an abscess. This extends approximately 5.7 cm in height on  coronal image 152/8. No associated foreign body or soft tissue  emphysema. This is approximately 8 cm lateral to the intergluteal  crease, and no involvement of the perianal soft tissues or  musculature identified. No hip joint effusion or evidence of  osteomyelitis. Chronic posterior subluxation of the coccyx.     Will consult general surgery for guidance in treatment.   Discussed case with Dr. Gerrit Friends with general surgery who agrees to take patient to the OR. IV antibiotics started. COVID test negative.  Final Clinical Impression(s) / ED Diagnoses Final diagnoses:  Abscess of buttock, left    Rx / DC Orders ED Discharge Orders    None       Jesusita Oka 04/16/20 1617    Terrilee Files, MD 04/16/20 [phone]

## 2020-04-16 NOTE — Progress Notes (Signed)
A consult was received from an ED physician for Vancomycin per pharmacy dosing.  The patient's profile has been reviewed for ht/wt/allergies/indication/available labs.   A one time order has been placed for Vancomycin 2gm IV.  Further antibiotics/pharmacy consults should be ordered by admitting physician if indicated.                       Thank you, Junita Push 04/16/2020  1:22 PM

## 2020-04-16 NOTE — Progress Notes (Signed)
Pharmacy Antibiotic Note  Sean Foster is a 44 y.o. male admitted on 04/16/2020 with cellulitis/ buttock abscess.  S/p I&D.  Pharmacy has been consulted for Vancomycin & Zosyn dosing.  Afebrile, no leukocytosis, CrCl>174ml/min.  Loading doses given in ED pre-op.  Plan: Zosyn 3.375g IV q8h (4 hour infusion).  Vancomycin 1gm IV q8h for Vanc trough ~15 Monitor renal function and cx data  Daily Scr while on V/Z combination   Height: 5\' 10"  (177.8 cm) Weight: 100 kg (220 lb 7.4 oz) IBW/kg (Calculated) : 73  Temp (24hrs), Avg:98 F (36.7 C), Min:97.6 F (36.4 C), Max:98.3 F (36.8 C)  Recent Labs  Lab 04/16/20 1022  WBC 5.9  CREATININE 0.78  LATICACIDVEN 1.6    Estimated Creatinine Clearance: 141.1 mL/min (by C-G formula based on SCr of 0.78 mg/dL).    Allergies  Allergen Reactions  . Chloroquine Itching  . Chloroquine   . Chlorothen Hives  . Chlorothen     Antimicrobials this admission: 5/23 Zosyn >>  5/23 Vancomycin >>  Dose adjustments this admission:  Microbiology results: 5/23 BCx:  5/23 Tissue Cx:  Thank you for allowing pharmacy to be a part of this patient's care.  6/23 PharmD, BCPS 04/16/2020 7:13 PM

## 2020-04-16 NOTE — Anesthesia Preprocedure Evaluation (Signed)
Anesthesia Evaluation  Patient identified by MRN, date of birth, ID band Patient awake    Reviewed: Allergy & Precautions, NPO status , Patient's Chart, lab work & pertinent test results  Airway Mallampati: II  TM Distance: >3 FB     Dental   Pulmonary neg pulmonary ROS,    breath sounds clear to auscultation       Cardiovascular hypertension,  Rhythm:Regular Rate:Normal     Neuro/Psych negative neurological ROS     GI/Hepatic negative GI ROS, Neg liver ROS,   Endo/Other  diabetes  Renal/GU negative Renal ROS     Musculoskeletal   Abdominal   Peds  Hematology   Anesthesia Other Findings   Reproductive/Obstetrics                             Anesthesia Physical Anesthesia Plan  ASA: III  Anesthesia Plan: General   Post-op Pain Management:    Induction: Intravenous  PONV Risk Score and Plan: 2 and Ondansetron, Dexamethasone and Midazolam  Airway Management Planned: Oral ETT  Additional Equipment:   Intra-op Plan:   Post-operative Plan: Extubation in OR  Informed Consent: I have reviewed the patients History and Physical, chart, labs and discussed the procedure including the risks, benefits and alternatives for the proposed anesthesia with the patient or authorized representative who has indicated his/her understanding and acceptance.     Dental advisory given  Plan Discussed with: Anesthesiologist and CRNA  Anesthesia Plan Comments:         Anesthesia Quick Evaluation

## 2020-04-16 NOTE — Progress Notes (Signed)
Called ED for report. Patient left to go to the OR

## 2020-04-16 NOTE — Op Note (Signed)
Operative Note  Pre-operative Diagnosis:  Left buttock abscess  Post-operative Diagnosis:  same  Surgeon:  Darnell Level, MD  Assistant:  none   Procedure:  Incision, drainage, and open packing of left buttock abscess  Anesthesia:  general  Estimated Blood Loss:  minimal  Drains: none         Specimen: cultures to lab  Indications:  Patient is a 44 year old male originally from [place] who presents to the emergency department with 3-day history of severe pain in the left buttock.  Patient states that he has had pain and recurrent infections involving the left buttock over the past month.  2 of these abscesses have spontaneously drained.  Patient developed a larger abscess approximately 3 days ago.  It is gradually increased in size and cause progressive discomfort.  He presented today to the emergency department for evaluation.  White blood cell count was normal.  Patient is afebrile.  He was noted to have induration and evidence of cellulitis involving essentially the entire left buttock.  There was no drainage.  He underwent CT scan which shows a irregular collection superficial to the gluteus maximus muscle measuring 5.7 cm in greatest dimension.  General surgery is called for management.  Procedure:  The patient was seen in the pre-op holding area. The risks, benefits, complications, treatment options, and expected outcomes were previously discussed with the patient. The patient agreed with the proposed plan and has signed the informed consent form.  The patient was brought to the operating room by the surgical team, identified as Sean Foster and the procedure verified. A "time out" was completed and the above information confirmed.  Following administration of general anesthesia, the patient is turned to a right lateral decubitus position on the beanbag and then properly positioned.  After ascertaining that an adequate level of anesthesia been achieved, the patient is prepped and draped  in the usual aseptic fashion.  Using the electrocautery an incision is made into the apex of the abscess.  Dissection is carried into the abscess cavity.  Purulent fluid is evacuated.  Aerobic and anaerobic cultures are obtained and submitted to the laboratory for review.  An ellipse of skin is excised overlying the abscess.  Abscess is digitally explored and debrided.  It measures approximately 5 x 5 x 5 cm in size.  Hemostasis is achieved with the electrocautery.  Wound is irrigated with warm saline which is evacuated.  Cavity is then packed with 4 inch Kerlix gauze saturated with Betadine.  Wound is covered with 4 x 4 gauze followed by an ABD pad.  Patient is awakened from anesthesia and transported to the recovery room in stable condition.  The patient tolerated the procedure well.  Darnell Level, MD [facility] Office: [phone]

## 2020-04-16 NOTE — Anesthesia Postprocedure Evaluation (Signed)
Anesthesia Post Note  Patient: Sean Foster  Procedure(s) Performed: INCISION AND DRAINAGE BUTTOCK ABSCESS (Left )     Patient location during evaluation: PACU Anesthesia Type: General Level of consciousness: awake Pain management: pain level controlled Vital Signs Assessment: post-procedure vital signs reviewed and stable Respiratory status: spontaneous breathing Cardiovascular status: stable Postop Assessment: no apparent nausea or vomiting Anesthetic complications: no    Last Vitals:  Vitals:   04/16/20 1800 04/16/20 1815  BP: (!) 161/112 (!) 172/110  Pulse: 94 96  Resp: 14 13  Temp: 36.8 C   SpO2:  100%    Last Pain:  Vitals:   04/16/20 0902  TempSrc: Oral  PainSc:                  Saryah Loper

## 2020-04-16 NOTE — Plan of Care (Signed)

## 2020-04-16 NOTE — ED Notes (Signed)
PATIENT REFUSED COVID TEST, PROVIDER AWARE.

## 2020-04-16 NOTE — ED Triage Notes (Addendum)
Patient reports having a boil on left buttock area. Patient states it started 1 month ago and will not resolve. He says pain is 9/10, unable to sleep to at night.  Patient bp high in triage. States he takes medications for bp but did not take today due to pain levels.

## 2020-04-16 NOTE — Transfer of Care (Signed)
Immediate Anesthesia Transfer of Care Note  Patient: Sean Foster  Procedure(s) Performed: INCISION AND DRAINAGE BUTTOCK ABSCESS (Left )  Patient Location: PACU  Anesthesia Type:General  Level of Consciousness: sedated  Airway & Oxygen Therapy: Patient Spontanous Breathing and Patient connected to face mask oxygen  Post-op Assessment: Report given to RN and Post -op Vital signs reviewed and stable  Post vital signs: Reviewed and stable  Last Vitals:  Vitals Value Taken Time  BP    Temp    Pulse    Resp    SpO2      Last Pain:  Vitals:   04/16/20 0902  TempSrc: Oral  PainSc:          Complications: No apparent anesthesia complications

## 2020-04-16 NOTE — H&P (Signed)
Sean Foster is an 44 y.o. male.    General Surgery [facility]  Chief Complaint: left buttock abscess, cellulitis, pain  HPI: Patient is a 44 year old male originally from [place] who presents to the emergency department with 3-day history of severe pain in the left buttock.  Patient states that he has had pain and recurrent infections involving the left buttock over the past month.  2 of these abscesses have spontaneously drained.  Patient developed a larger abscess approximately 3 days ago.  It is gradually increased in size and cause progressive discomfort.  He presented today to the emergency department for evaluation.  White blood cell count was normal.  Patient is afebrile.  He was noted to have induration and evidence of cellulitis involving essentially the entire left buttock.  There was no drainage.  He underwent CT scan which shows a irregular collection superficial to the gluteus maximus muscle measuring 5.7 cm in greatest dimension.  General surgery is called for management.  Patient has diabetes which is poorly controlled.  He also has a history of hypertension.  He has not had any recent surgical procedures for abscess drainage.  Patient operates a used Programme researcher, broadcasting/film[employer]video.  Past Medical History:  Diagnosis Date  . Diabetes mellitus without complication (HCC)   . Hypertension     History reviewed. No pertinent surgical history.  History reviewed. No pertinent family history. Social History:  reports that he has never smoked. He has never used smokeless tobacco. He reports previous alcohol use. He reports previous drug use.  Allergies:  Allergies  Allergen Reactions  . Chloroquine Itching  . Chloroquine   . Chlorothen Hives  . Chlorothen     (Not in a hospital admission)   Results for orders placed or performed during the hospital encounter of 04/16/20 (from the past 48 hour(s))  CBC with Differential     Status: Abnormal   Collection Time: 04/16/20  10:22 AM  Result Value Ref Range   WBC 5.9 4.0 - 10.5 K/uL   RBC 5.45 4.22 - 5.81 MIL/uL   Hemoglobin 11.8 (L) 13.0 - 17.0 g/dL   HCT 61.6 07.3 - 71.0 %   MCV 73.6 (L) 80.0 - 100.0 fL   MCH 21.7 (L) 26.0 - 34.0 pg   MCHC 29.4 (L) 30.0 - 36.0 g/dL   RDW 62.6 94.8 - 54.6 %   Platelets 250 150 - 400 K/uL   nRBC 0.0 0.0 - 0.2 %   Neutrophils Relative % 60 %   Neutro Abs 3.6 1.7 - 7.7 K/uL   Lymphocytes Relative 29 %   Lymphs Abs 1.7 0.7 - 4.0 K/uL   Monocytes Relative 9 %   Monocytes Absolute 0.5 0.1 - 1.0 K/uL   Eosinophils Relative 1 %   Eosinophils Absolute 0.1 0.0 - 0.5 K/uL   Basophils Relative 0 %   Basophils Absolute 0.0 0.0 - 0.1 K/uL   Immature Granulocytes 1 %   Abs Immature Granulocytes 0.03 0.00 - 0.07 K/uL    Comment: Performed at [facility], [address], [place], [place] [address]  Basic metabolic panel     Status: Abnormal   Collection Time: 04/16/20 10:22 AM  Result Value Ref Range   Sodium 136 135 - 145 mmol/L   Potassium 4.3 3.5 - 5.1 mmol/L   Chloride 100 98 - 111 mmol/L   CO2 27 22 - 32 mmol/L   Glucose, Bld 321 (H) 70 - 99 mg/dL    Comment:  Glucose reference range applies only to samples taken after fasting for at least 8 hours.   BUN 9 6 - 20 mg/dL   Creatinine, Ser 6.27 0.61 - 1.24 mg/dL   Calcium 8.9 8.9 - 03.5 mg/dL   GFR calc non Af Amer >60 >60 mL/min   GFR calc Af Amer >60 >60 mL/min   Anion gap 9 5 - 15    Comment: Performed at [facility], [address] [place], [place] [address]  Lactic acid, plasma     Status: None   Collection Time: 04/16/20 10:22 AM  Result Value Ref Range   Lactic Acid, Venous 1.6 0.5 - 1.9 mmol/L    Comment: Performed at [facility], [address] [place], [place] [address]  SARS Coronavirus 2 by RT PCR (hospital order, performed in [facility] hospital lab) Nasopharyngeal Nasopharyngeal Swab     Status: None   Collection Time: 04/16/20 10:22 AM    Specimen: Nasopharyngeal Swab  Result Value Ref Range   SARS Coronavirus 2 NEGATIVE NEGATIVE    Comment: (NOTE) SARS-CoV-2 target nucleic acids are NOT DETECTED. The SARS-CoV-2 RNA is generally detectable in upper and lower respiratory specimens during the acute phase of infection. The lowest concentration of SARS-CoV-2 viral copies this assay can detect is 250 copies / mL. A negative result does not preclude SARS-CoV-2 infection and should not be used as the sole basis for treatment or other patient management decisions.  A negative result may occur with improper specimen collection / handling, submission of specimen other than nasopharyngeal swab, presence of viral mutation(s) within the areas targeted by this assay, and inadequate number of viral copies (<250 copies / mL). A negative result must be combined with clinical observations, patient history, and epidemiological information. Fact Sheet for Patients:   BoilerBrush.com.cy Fact Sheet for Healthcare Providers: https://pope.com/ This test is not yet approved or cleared  by the [place] FDA and has been authorized for detection and/or diagnosis of SARS-CoV-2 by FDA under an Emergency Use Authorization (EUA).  This EUA will remain in effect (meaning this test can be used) for the duration of the COVID-19 declaration under Section 564(b)(1) of the Act, 21 U.S.C. section 360bbb-3(b)(1), unless the authorization is terminated or revoked sooner. Performed at [facility], [address] [place], [place] [address]    CT PELVIS W CONTRAST  Result Date: 04/16/2020 CLINICAL DATA:  Unresolving boil over the left buttocks for 1 month. Persistent pain. EXAM: CT PELVIS WITH CONTRAST TECHNIQUE: Multidetector CT imaging of the pelvis was performed using the standard protocol following the bolus administration of intravenous contrast. CONTRAST:  OMNIPAQUE IOHEXOL 300  MG/ML  SOLN COMPARISON:  Abdominopelvic CT 03/21/2020. FINDINGS: Study was performed with the patient prone. Urinary Tract: The visualized distal ureters and bladder appear unremarkable. Bowel: No bowel wall thickening, distention or surrounding inflammation identified within the pelvis. The pelvis appears normal. Vascular/Lymphatic: No enlarged pelvic lymph nodes identified. Minimal iliac atherosclerosis. No acute vascular findings. Reproductive: The prostate gland and seminal vesicles appear normal. Other: No ascites or free intraperitoneal air. No intrapelvic inflammatory changes or fluid collections are seen. Musculoskeletal: There are inflammatory changes within the subcutaneous fat of the left buttocks. There is an ill-defined peripherally enhancing fluid collection superficial to the gluteus maximus muscle which measures 5.4 x 3.6 cm on image 94/3, suspicious for an abscess. This extends approximately 5.7 cm in height on coronal image 152/8. No associated foreign body or soft tissue emphysema.  This is approximately 8 cm lateral to the intergluteal crease, and no involvement of the perianal soft tissues or musculature identified. No hip joint effusion or evidence of osteomyelitis. Chronic posterior subluxation of the coccyx. IMPRESSION: 1. Ill-defined peripherally enhancing fluid collection superficial to the left gluteus maximus muscle, suspicious for an abscess. No associated foreign body or soft tissue emphysema. 2. No evidence of perianal inflammation or osteomyelitis. Electronically Signed   By: Richardean Sale M.D.   On: 04/16/2020 12:44    Review of Systems  Constitutional: Negative.   HENT: Negative.   Eyes: Negative.   Respiratory: Negative.   Cardiovascular: Negative.   Gastrointestinal: Negative.   Endocrine: Negative.   Genitourinary: Negative.   Musculoskeletal:       Pain in left buttock  Skin: Positive for color change.  Neurological: Negative.   Hematological: Negative.    Psychiatric/Behavioral: Negative.     Blood pressure (!) 156/97, pulse 78, temperature 97.6 F (36.4 C), temperature source Oral, resp. rate 18, height 5\' 10"  (1.778 m), weight 100 kg, SpO2 100 %.  CONSTITUTIONAL: NAD; conversant; no obvious deformities EYES:   Moist conjunctiva; no lid lag; anicteric; PERRL NECK:   Trachea midline; no thyromegaly LUNGS:  Normal respiratory effort; no wheeze; no rales; no tactile fremitus CV:   RRR; no palpable thrills; no pitting edema GI:    Abdomen is soft, non-tender; no palpable hepatosplenomegaly MSK:    Normal range of motion of extremities; no clubbing; no cyanosis; the skin overlying the left buttock is firm, indurated, and tender over an area measuring 20x25 cm; there is epidermolysis; there is no drainage PSYCHIATRIC:   Appropriate affect for situation; alert and oriented X 3 LYMPHATIC:   No palpable cervical lymphadenopathy; no palpable axillary adenopathy   Assessment/Plan Left buttock abscess, cellulitis  IV abx's started in ER  NPO, IV hydration  Plan OR for incision, drainage, debridement, and open packing  Will admit for IV abx DM  Poorly controlled  Will start SSI  If persistent elevated glucose, will get TRH consultation  I have discussed urgent operative intervention with the patient.  I explained that we will open the abscess on the left buttock widely and debride the abscess cavity.  We will then packed this with antibiotic saturated gauze dressings which will need to be changed tomorrow and then once or twice daily for approximately 2 to 3 weeks while the wound heals by secondary intention.  Patient will require intravenous antibiotics while here in the hospital and will likely go home on oral antibiotics at discharge.  The patient states that he understands and agrees to proceed with surgery.  Armandina Gemma, MD [facility] Office: [place], [place] 04/16/2020, 4:02 PM

## 2020-04-17 LAB — GLUCOSE, CAPILLARY
Glucose-Capillary: 173 mg/dL — ABNORMAL HIGH (ref 70–99)
Glucose-Capillary: 204 mg/dL — ABNORMAL HIGH (ref 70–99)
Glucose-Capillary: 283 mg/dL — ABNORMAL HIGH (ref 70–99)
Glucose-Capillary: 367 mg/dL — ABNORMAL HIGH (ref 70–99)

## 2020-04-17 LAB — CREATININE, SERUM
Creatinine, Ser: 0.9 mg/dL (ref 0.61–1.24)
GFR calc Af Amer: >60 mL/min (ref >60)
GFR calc non Af Amer: >60 mL/min (ref >60)

## 2020-04-17 LAB — HEMOGLOBIN A1C
Hgb A1c MFr Bld: 13.1 % — ABNORMAL HIGH (ref 4.8–5.6)
Mean Plasma Glucose: 329.27 mg/dL

## 2020-04-17 MED ORDER — INSULIN STARTER KIT- PEN NEEDLES (ENGLISH): 1.0000 | Freq: Once | 0 refills | Status: AC

## 2020-04-17 MED ORDER — "PEN NEEDLES 3/16"" 31G X 5 MM MISC": 1.0000 | Freq: Every day | 0 refills | Status: DC

## 2020-04-17 MED ORDER — INSULIN GLARGINE 100 UNIT/ML SOLOSTAR PEN
15.0000 [IU] | PEN_INJECTOR | Freq: Every day | SUBCUTANEOUS | 0 refills | Status: DC
Start: 2020-04-17 — End: 2024-03-14

## 2020-04-17 MED ORDER — DOXYCYCLINE HYCLATE 50 MG PO CAPS
100.0000 mg | ORAL_CAPSULE | Freq: Two times a day (BID) | ORAL | 0 refills | Status: AC
Start: 2020-04-17 — End: 2020-04-24

## 2020-04-17 MED ORDER — LIVING WELL WITH DIABETES BOOK
Freq: Once | Status: AC
  Administered 2020-04-17: 1
  Filled 2020-04-17: qty 1

## 2020-04-17 MED ORDER — METFORMIN HCL 500 MG PO TABS: 500.0000 mg | ORAL_TABLET | Freq: Two times a day (BID) | ORAL | 0 refills | Status: DC

## 2020-04-17 MED ORDER — INSULIN STARTER KIT- PEN NEEDLES (ENGLISH)
1.0000 | Freq: Once | Status: AC
  Administered 2020-04-17: 1
  Filled 2020-04-17 (×2): qty 1

## 2020-04-17 MED ORDER — OXYCODONE HCL 5 MG PO TABS: 5.0000 mg | ORAL_TABLET | Freq: Four times a day (QID) | ORAL | 0 refills | Status: DC | PRN

## 2020-04-17 NOTE — Progress Notes (Signed)
Nurse entered patient's room to find patient in a significant amount of pain over IV site. No swelling in the area, no redness observed and dressing was clean, dry and intact. Informed the patient we can remove the IV but it is recommended that a new one be inserted for emergency purposes especially after administration of pain medication PO at this time. After removal of the IV, patient refused a new site. Educated the patient on why he would need a new IV site for the purposes of upcoming scheduled IV antibiotics and pain medication but the patient continues to deny. Patient states it was too painful, he just wants to sleep and that maybe in the morning he will get another. Dr Gerrit Friends notified and stated use oral pain medications for now and will reassess in am.

## 2020-04-17 NOTE — Progress Notes (Signed)
Patient refusing am labs.

## 2020-04-17 NOTE — Discharge Summary (Addendum)
Patient ID: Sean Foster [id number] [birth year] 44 y.o.  Admit date: 04/16/2020 Discharge date: 04/17/2020  Admitting Diagnosis: Left gluteal abscess Uncontrolled DM  Discharge Diagnosis Patient Active Problem List   Diagnosis Date Noted   Left buttock abscess 04/16/2020   Diabetes ([place]) 04/16/2020   Cellulitis of left buttock 04/16/2020  s/p I&D of left gluteal abscess  Consultants Diabetes Coordinator  Reason for Admission: Patient is a 44 year old male originally from [place] who presents to the emergency department with 3-day history of severe pain in the left buttock.  Patient states that he has had pain and recurrent infections involving the left buttock over the past month.  2 of these abscesses have spontaneously drained.  Patient developed a larger abscess approximately 3 days ago.  It is gradually increased in size and cause progressive discomfort.  He presented today to the emergency department for evaluation.  White blood cell count was normal.  Patient is afebrile.  He was noted to have induration and evidence of cellulitis involving essentially the entire left buttock.  There was no drainage.  He underwent CT scan which shows a irregular collection superficial to the gluteus maximus muscle measuring 5.7 cm in greatest dimension.  General surgery is called for management.   Patient has diabetes which is poorly controlled.  He also has a history of hypertension.  He has not had any recent surgical procedures for abscess drainage.   Patient operates a used Agricultural consultant.  Procedures Incision and drainage of left gluteal abscess, Dr. Harlow Asa 04/16/20  Hospital Course:  The patient was admitted and taken to the OR for the above procedure.  Dressing changes were started the following day.  His IV came out the night after surgery and he refused for it to be replaced and for his morning labs to be drawn.  He is requesting to be DC home at this time.  His wound is clean and  stable.  I taught his wife how to do his dressing changes as she was at the bedside.  He is afebrile.  His hgba1c is 13 and he is an uncontrolled diabetic.  I have requested DM care coordinator to see the patient for recommendations for home medications, which include metformin 535m BID and Solostar injection pen, 15 units qhs.  These were sent to his pharmacy.  The patient is told he needs to obtain a PCP to follow up for his diabetes.  He is also educated on the fact that he needs to be checking his CBGs at home.  He states he has all the equipment he needs for checking his blood sugars at home etc.  He agrees to obtain a PCP as an outpatient.  He is otherwise stable for DC home,  Physical Exam: Abd: soft, NT, ND, +BS Skin: left gluteal wound is clean.  Still with induration of his buttock, but no purulent drainage is noted.  This is repacked.   Allergies as of 04/17/2020       Reactions   Chloroquine Itching   Chloroquine    Chlorothen Hives   Chlorothen         Medication List     TAKE these medications    acetaminophen 500 MG tablet Commonly known as: TYLENOL Take 1,000 mg by mouth every 6 (six) hours as needed for mild pain or headache.   cyclobenzaprine 10 MG tablet Commonly known as: FLEXERIL Take 1 tablet (10 mg total) by mouth 2 (two) times daily as needed for muscle spasms.  doxycycline 50 MG capsule Commonly known as: VIBRAMYCIN Take 2 capsules (100 mg total) by mouth 2 (two) times daily for 7 days.   hydrochlorothiazide 25 MG tablet Commonly known as: HYDRODIURIL Take 1 tablet (25 mg total) by mouth daily.   ibuprofen 600 MG tablet Commonly known as: IBU Take 1 tablet (600 mg total) by mouth every 6 (six) hours as needed.   insulin glargine 100 UNIT/ML Solostar Pen Commonly known as: LANTUS Inject 15 Units into the skin at bedtime.   insulin starter kit- pen needles Misc 1 kit by Other route once for 1 dose.   metFORMIN 500 MG tablet Commonly known as:  Glucophage Take 1 tablet (500 mg total) by mouth 2 (two) times daily with a meal.   oxyCODONE 5 MG immediate release tablet Commonly known as: Oxy IR/ROXICODONE Take 1-2 tablets (5-10 mg total) by mouth every 6 (six) hours as needed for moderate pain.         Follow-up Information     [facility], [place] Follow up on 05/02/2020.   Specialty: General Surgery Why: 8:30am, arrive by 8:00am for paperwork and check in process.  please bring photo ID and insurance card Contact information: [address] [place] [address] [phone]            Signed: Saverio Danker, [facility] 04/17/2020, 2:19 PM Please see Amion for pager number during day hours 7:00am-4:30pm, 7-11:30am on Weekends  Agree with above.  Alphonsa Overall, MD, [facility] Office phone:  [phone]

## 2020-04-17 NOTE — Discharge Instructions (Signed)
Buttock WOUND CARE: - buttock dressing to be changed once daily - supplies: sterile saline, kerlix/gauze, scissors, tape  - remove dressing and all packing carefully, moistening with sterile saline as needed to avoid packing/internal dressing sticking to the wound. - clean edges of skin around the wound with water/gauze, making sure there is no tape debris or leakage left on skin that could cause skin irritation or breakdown. - dampen and clean kerlix with sterile saline and pack wound from wound base to skin level, making sure to take note of any possible areas of wound tracking, tunneling and packing appropriately. Wound can be packed loosely. Trim kerlix to size if a whole kerlix is not required. - cover wound with a dry gauze and secure with tape.  - change dressing as needed if leakage occurs, wound gets contaminated, or patient requests to shower. - patient may shower daily with wound open and following the shower the wound should be dried and a clean dressing placed.    Diabetes Basics  Diabetes (diabetes mellitus) is a long-term (chronic) disease. It occurs when the body does not properly use sugar (glucose) that is released from food after you eat. Diabetes may be caused by one or both of these problems:  Your pancreas does not make enough of a hormone called insulin.  Your body does not react in a normal way to insulin that it makes. Insulin lets sugars (glucose) go into cells in your body. This gives you energy. If you have diabetes, sugars cannot get into cells. This causes high blood sugar (hyperglycemia). Follow these instructions at home: How is diabetes treated? You may need to take insulin or other diabetes medicines daily to keep your blood sugar in balance. Take your diabetes medicines every day as told by your doctor. List your diabetes medicines here: Diabetes medicines  Name of medicine: ______________________________ ? Amount (dose): _______________ Time (a.m./p.m.):  _______________ Notes: ___________________________________  Name of medicine: ______________________________ ? Amount (dose): _______________ Time (a.m./p.m.): _______________ Notes: ___________________________________  Name of medicine: ______________________________ ? Amount (dose): _______________ Time (a.m./p.m.): _______________ Notes: ___________________________________ If you use insulin, you will learn how to give yourself insulin by injection. You may need to adjust the amount based on the food that you eat. List the types of insulin you use here: Insulin  Insulin type: ______________________________ ? Amount (dose): _______________ Time (a.m./p.m.): _______________ Notes: ___________________________________  Insulin type: ______________________________ ? Amount (dose): _______________ Time (a.m./p.m.): _______________ Notes: ___________________________________  Insulin type: ______________________________ ? Amount (dose): _______________ Time (a.m./p.m.): _______________ Notes: ___________________________________  Insulin type: ______________________________ ? Amount (dose): _______________ Time (a.m./p.m.): _______________ Notes: ___________________________________  Insulin type: ______________________________ ? Amount (dose): _______________ Time (a.m./p.m.): _______________ Notes: ___________________________________ How do I manage my blood sugar?  Check your blood sugar levels using a blood glucose monitor as directed by your doctor. Your doctor will set treatment goals for you. Generally, you should have these blood sugar levels:  Before meals (preprandial): 80-130 mg/dL (6.1-6.0 mmol/L).  After meals (postprandial): below 180 mg/dL (10 mmol/L).  A1c level: less than 7%. Write down the times that you will check your blood sugar levels: Blood sugar checks  Time: _______________ Notes: ___________________________________  Time: _______________ Notes:  ___________________________________  Time: _______________ Notes: ___________________________________  Time: _______________ Notes: ___________________________________  Time: _______________ Notes: ___________________________________  Time: _______________ Notes: ___________________________________  What do I need to know about low blood sugar? Low blood sugar is called hypoglycemia. This is when blood sugar is at or below 70 mg/dL (3.9 mmol/L). Symptoms may include:  Feeling: ? Hungry. ? Worried or nervous (anxious). ? Sweaty and clammy. ? Confused. ? Dizzy. ? Sleepy. ? Sick to your stomach (nauseous).  Having: ? A fast heartbeat. ? A headache. ? A change in your vision. ? Tingling or no feeling (numbness) around the mouth, lips, or tongue. ? Jerky movements that you cannot control (seizure).  Having trouble with: ? Moving (coordination). ? Sleeping. ? Passing out (fainting). ? Getting upset easily (irritability). Treating low blood sugar To treat low blood sugar, eat or drink something sugary right away. If you can think clearly and swallow safely, follow the 15:15 rule:  Take 15 grams of a fast-acting carb (carbohydrate). Talk with your doctor about how much you should take.  Some fast-acting carbs are: ? Sugar tablets (glucose pills). Take 3-4 glucose pills. ? 6-8 pieces of hard candy. ? 4-6 oz (120-150 mL) of fruit juice. ? 4-6 oz (120-150 mL) of regular (not diet) soda. ? 1 Tbsp (15 mL) honey or sugar.  Check your blood sugar 15 minutes after you take the carb.  If your blood sugar is still at or below 70 mg/dL (3.9 mmol/L), take 15 grams of a carb again.  If your blood sugar does not go above 70 mg/dL (3.9 mmol/L) after 3 tries, get help right away.  After your blood sugar goes back to normal, eat a meal or a snack within 1 hour. Treating very low blood sugar If your blood sugar is at or below 54 mg/dL (3 mmol/L), you have very low blood sugar (severe  hypoglycemia). This is an emergency. Do not wait to see if the symptoms will go away. Get medical help right away. Call your local emergency services (911 in the [place] Do not drive yourself to the hospital. Questions to ask your health care provider  Do I need to meet with a diabetes educator?  What equipment will I need to care for myself at home?  What diabetes medicines do I need? When should I take them?  How often do I need to check my blood sugar?  What number can I call if I have questions?  When is my next doctor's visit?  Where can I find a support group for people with diabetes? Where to find more information  American Diabetes Association: www.diabetes.org  American Association of Diabetes Educators: www.diabeteseducator.org/patient-resources Contact a doctor if:  Your blood sugar is at or above 240 mg/dL (62.7 mmol/L) for 2 days in a row.  You have been sick or have had a fever for 2 days or more, and you are not getting better.  You have any of these problems for more than 6 hours: ? You cannot eat or drink. ? You feel sick to your stomach (nauseous). ? You throw up (vomit). ? You have watery poop (diarrhea). Get help right away if:  Your blood sugar is lower than 54 mg/dL (3 mmol/L).  You get confused.  You have trouble: ? Thinking clearly. ? Breathing. Summary  Diabetes (diabetes mellitus) is a long-term (chronic) disease. It occurs when the body does not properly use sugar (glucose) that is released from food after digestion.  Take insulin and diabetes medicines as told.  Check your blood sugar every day, as often as told.  Keep all follow-up visits as told by your doctor. This is important. This information is not intended to replace advice given to you by your health care provider. Make sure you discuss any questions you have with your health care provider. Document  Revised: 08/04/2019 Document Reviewed: 02/13/2018 Elsevier Patient Education   2020 [employer]   Hyperglycemia Hyperglycemia is when the sugar (glucose) level in your blood is too high. It may not cause symptoms. If you do have symptoms, they may include warning signs, such as:  Feeling more thirsty than normal.  Hunger.  Feeling tired.  Needing to pee (urinate) more than normal.  Blurry eyesight (vision). You may get other symptoms as it gets worse, such as:  Dry mouth.  Not being hungry (loss of appetite).  Fruity-smelling breath.  Weakness.  Weight gain or loss that is not planned. Weight loss may be fast.  A tingling or numb feeling in your hands or feet.  Headache.  Skin that does not bounce back quickly when it is lightly pinched and released (poor skin turgor).  Pain in your belly (abdomen).  Cuts or bruises that heal slowly. High blood sugar can happen to people who do or do not have diabetes. High blood sugar can happen slowly or quickly, and it can be an emergency. Follow these instructions at home: General instructions  Take over-the-counter and prescription medicines only as told by your doctor.  Do not use products that contain nicotine or tobacco, such as cigarettes and e-cigarettes. If you need help quitting, ask your doctor.  Limit alcohol intake to no more than 1 drink per day for nonpregnant women and 2 drinks per day for men. One drink equals 12 oz of beer, 5 oz of wine, or 1 oz of hard liquor.  Manage stress. If you need help with this, ask your doctor.  Keep all follow-up visits as told by your doctor. This is important. Eating and drinking   Stay at a healthy weight.  Exercise regularly, as told by your doctor.  Drink enough fluid, especially when you: ? Exercise. ? Get sick. ? Are in hot temperatures.  Eat healthy foods, such as: ? Low-fat (lean) proteins. ? Complex carbs (complex carbohydrates), such as whole wheat bread or brown rice. ? Fresh fruits and vegetables. ? Low-fat dairy products. ? Healthy  fats.  Drink enough fluid to keep your pee (urine) clear or pale yellow. If you have diabetes:   Make sure you know the symptoms of hyperglycemia.  Follow your diabetes management plan, as told by your doctor. Make sure you: ? Take insulin and medicines as told. ? Follow your exercise plan. ? Follow your meal plan. Eat on time. Do not skip meals. ? Check your blood sugar as often as told. Make sure to check before and after exercise. If you exercise longer or in a different way than you normally do, check your blood sugar more often. ? Follow your sick day plan whenever you cannot eat or drink normally. Make this plan ahead of time with your doctor.  Share your diabetes management plan with people in your workplace, school, and household.  Check your urine for ketones when you are ill and as told by your doctor.  Carry a card or wear jewelry that says that you have diabetes. Contact a doctor if:  Your blood sugar level is higher than 240 mg/dL (08.6 mmol/L) for 2 days in a row.  You have problems keeping your blood sugar in your target range.  High blood sugar happens often for you. Get help right away if:  You have trouble breathing.  You have a change in how you think, feel, or act (mental status).  You feel sick to your stomach (nauseous), and that  feeling does not go away.  You cannot stop throwing up (vomiting). These symptoms may be an emergency. Do not wait to see if the symptoms will go away. Get medical help right away. Call your local emergency services (911 in the [place] Do not drive yourself to the hospital. Summary  Hyperglycemia is when the sugar (glucose) level in your blood is too high.  High blood sugar can happen to people who do or do not have diabetes.  Make sure you drink enough fluids, eat healthy foods, and exercise regularly.  Contact your doctor if you have problems keeping your blood sugar in your target range. This information is not intended  to replace advice given to you by your health care provider. Make sure you discuss any questions you have with your health care provider. Document Revised: 07/29/2016 Document Reviewed: 07/29/2016 Elsevier Patient Education  [employer].

## 2020-04-17 NOTE — Progress Notes (Signed)
Discharge instructions given to patient and all questions were answered.  

## 2020-04-17 NOTE — Progress Notes (Signed)
Inpatient Diabetes Program Recommendations  AACE/ADA: New Consensus Statement on Inpatient Glycemic Control (2015)  Target Ranges:  Prepandial:   less than 140 mg/dL      Peak postprandial:   less than 180 mg/dL (1-2 hours)      Critically ill patients:  140 - 180 mg/dL   Lab Results  Component Value Date   GLUCAP 283 (H) 04/17/2020   HGBA1C 13.1 (H) 04/17/2020    Review of Glycemic Control  Diabetes history: DM2 Outpatient Diabetes medications: None Current orders for Inpatient glycemic control: Novolog 0-15 units Q4H  HgbA1C 13.1%  Inpatient Diabetes Program Recommendations:     For home:  Metformin 500 mg bid Lantus Solostar pen 15 units QHS  F/U with PCP - needs to call to find PCP - pt has private insurance.  Thank you. Ailene Ards, RD, LDN, CDE Inpatient Diabetes Coordinator [phone]

## 2020-04-21 LAB — CULTURE, BLOOD (ROUTINE X 2): Culture: NO GROWTH

## 2020-04-22 LAB — AEROBIC/ANAEROBIC CULTURE W GRAM STAIN (SURGICAL/DEEP WOUND)

## 2020-11-15 ENCOUNTER — Other Ambulatory Visit: Payer: Self-pay

## 2020-11-15 ENCOUNTER — Encounter (HOSPITAL_COMMUNITY): Payer: Self-pay | Admitting: Emergency Medicine

## 2020-11-15 ENCOUNTER — Emergency Department (HOSPITAL_COMMUNITY)
Admission: EM | Admit: 2020-11-15 | Discharge: 2020-11-15 | Disposition: A | Discharge status: Home / Self Care | Payer: BLUE CROSS/BLUE SHIELD | Attending: Emergency Medicine | Admitting: Emergency Medicine

## 2020-11-15 DIAGNOSIS — I10 Essential (primary) hypertension: Secondary | ICD-10-CM | POA: Insufficient documentation

## 2020-11-15 DIAGNOSIS — E119 Type 2 diabetes mellitus without complications: Secondary | ICD-10-CM | POA: Insufficient documentation

## 2020-11-15 DIAGNOSIS — Z7984 Long term (current) use of oral hypoglycemic drugs: Secondary | ICD-10-CM | POA: Insufficient documentation

## 2020-11-15 DIAGNOSIS — L0291 Cutaneous abscess, unspecified: Secondary | ICD-10-CM

## 2020-11-15 DIAGNOSIS — Z794 Long term (current) use of insulin: Secondary | ICD-10-CM | POA: Insufficient documentation

## 2020-11-15 DIAGNOSIS — Z79899 Other long term (current) drug therapy: Secondary | ICD-10-CM | POA: Insufficient documentation

## 2020-11-15 DIAGNOSIS — L02411 Cutaneous abscess of right axilla: Secondary | ICD-10-CM | POA: Insufficient documentation

## 2020-11-15 MED ORDER — DOXYCYCLINE HYCLATE 100 MG PO CAPS: 100.0000 mg | ORAL_CAPSULE | Freq: Two times a day (BID) | ORAL | 0 refills | Status: DC

## 2020-11-15 MED ORDER — HYDROCODONE-ACETAMINOPHEN 5-325 MG PO TABS
1.0000 | ORAL_TABLET | Freq: Once | ORAL | Status: AC
  Administered 2020-11-15: 13:00:00 1 via ORAL
  Filled 2020-11-15: qty 1

## 2020-11-15 MED ORDER — STERILE WATER FOR INJECTION IJ SOLN
INTRAMUSCULAR | Status: AC
  Filled 2020-11-15: qty 10

## 2020-11-15 MED ORDER — HYDROCODONE-ACETAMINOPHEN 5-325 MG PO TABS: 1.0000 | ORAL_TABLET | ORAL | 0 refills | Status: DC | PRN

## 2020-11-15 MED ORDER — CEFTRIAXONE SODIUM 1 G IJ SOLR
1.0000 g | Freq: Once | INTRAMUSCULAR | Status: AC
  Administered 2020-11-15: 13:00:00 1 g via INTRAMUSCULAR
  Filled 2020-11-15: qty 10

## 2020-11-15 MED ORDER — LIDOCAINE-EPINEPHRINE (PF) 2 %-1:200000 IJ SOLN
10.0000 mL | Freq: Once | INTRAMUSCULAR | Status: AC
  Administered 2020-11-15: 10 mL
  Filled 2020-11-15: qty 20

## 2020-11-15 NOTE — ED Provider Notes (Signed)
[facility]-EMERGENCY DEPT Provider Note   CSN: [id number] Arrival date & time: 11/15/20  1019    History Chief Complaint  Patient presents with  . Abscess    Sean Foster is a 44 y.o. male with past medical history significant for diabetes, hypertension, recurrent abscess who presents for evaluation of abscess to right axilla.  Patient noted that 3 days ago.  Has gradually increased in size.  No overlying erythema or warmth.  There is tender palpation.  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  He has not had recurrence of this.  No fevers or chills.  He is compliant with his diabetes medications.  No chest pain, shortness of breath, arm pain, chest pain, paresthesias, numbness nausea or vomiting or abdominal pain.  Denies any additional aggravating or alleviating factors.  No decreased range of motion.  Rates his pain a 9/10 when he palpates his axilla.     Patient and wife would like attempt with drainage here in ED versus OR which she had previously done for gluteal abscess.  History obtained from patient and past medical records.  No interpreter use.   HPI     Past Medical History:  Diagnosis Date  . Diabetes mellitus without complication (HCC)   . Hypertension     Patient Active Problem List   Diagnosis Date Noted  . Left buttock abscess 04/16/2020  . Diabetes (HCC) 04/16/2020  . Cellulitis of left buttock 04/16/2020    Past Surgical History:  Procedure Laterality Date  . INCISION AND DRAINAGE ABSCESS Left 04/16/2020   Procedure: INCISION AND DRAINAGE BUTTOCK ABSCESS;  Surgeon: Darnell Level, MD;  Location: [facility] ORS;  Service: General;  Laterality: Left;       No family history on file.  Social History   Tobacco Use  . Smoking status: Never Smoker  . Smokeless tobacco: Never Used  Vaping Use  . Vaping Use: Never used  Substance Use Topics  . Alcohol use: Not Currently    Comment: 1 drink per  week  . Drug use: Not Currently    Home Medications Prior to Admission medications   Medication Sig Start Date End Date Taking? Authorizing Provider  acetaminophen (TYLENOL) 500 MG tablet Take 1,000 mg by mouth every 6 (six) hours as needed for mild pain or headache.    [provider]  cyclobenzaprine (FLEXERIL) 10 MG tablet Take 1 tablet (10 mg total) by mouth 2 (two) times daily as needed for muscle spasms. 03/21/20   Linwood Dibbles, MD  doxycycline (VIBRAMYCIN) 100 MG capsule Take 1 capsule (100 mg total) by mouth 2 (two) times daily. 11/15/20   Oree Mirelez A, PA-C  hydrochlorothiazide (HYDRODIURIL) 25 MG tablet Take 1 tablet (25 mg total) by mouth daily. 06/16/19   Renne Crigler, PA-C  HYDROcodone-acetaminophen (NORCO/VICODIN) 5-325 MG tablet Take 1 tablet by mouth every 4 (four) hours as needed. 11/15/20   Ahmya Bernick A, PA-C  ibuprofen (IBU) 600 MG tablet Take 1 tablet (600 mg total) by mouth every 6 (six) hours as needed. 03/21/20   Linwood Dibbles, MD  insulin glargine (LANTUS) 100 UNIT/ML Solostar Pen Inject 15 Units into the skin at bedtime. 04/17/20 05/17/20  Barnetta Chapel, PA-C  Insulin Pen Needle (PEN NEEDLES 3/16") 31G X 5 MM MISC 1 each by Does not apply route daily. 04/17/20   Barnetta Chapel, PA-C  metFORMIN (GLUCOPHAGE) 500 MG tablet Take 1 tablet (500 mg total) by mouth 2 (two)  times daily with a meal. 04/17/20 05/17/20  Barnetta Chapel, PA-C  oxyCODONE (OXY IR/ROXICODONE) 5 MG immediate release tablet Take 1-2 tablets (5-10 mg total) by mouth every 6 (six) hours as needed for moderate pain. 04/17/20   Barnetta Chapel, PA-C  amLODipine (NORVASC) 5 MG tablet Take 1 tablet (5 mg total) by mouth daily. Patient not taking: Reported on 06/16/2019 02/24/18 06/16/19  Raeford Razor, MD    Allergies    Chloroquine, Chloroquine, Chlorothen, and Chlorothen  Review of Systems   Review of Systems  Constitutional: Negative.   HENT: Negative.   Respiratory: Negative.   Cardiovascular:  Negative.   Gastrointestinal: Negative.   Genitourinary: Negative.   Musculoskeletal:       Right axilla abscess  Skin: Positive for wound. Negative for color change, pallor and rash.  Neurological: Negative.   All other systems reviewed and are negative.   Physical Exam Updated Vital Signs BP (!) 165/102   Pulse 79   Temp 98.4 F (36.9 C) (Oral)   Resp 18   Ht 5\' 10"  (1.778 m)   Wt 113.4 kg   SpO2 99%   BMI 35.87 kg/m   Physical Exam Vitals and nursing note reviewed.  Constitutional:      General: He is not in acute distress.    Appearance: He is well-developed and well-nourished. He is not ill-appearing, toxic-appearing or diaphoretic.  HENT:     Head: Normocephalic and atraumatic.     Nose: Nose normal.     Mouth/Throat:     Mouth: Mucous membranes are moist.  Eyes:     Pupils: Pupils are equal, round, and reactive to light.  Cardiovascular:     Rate and Rhythm: Normal rate and regular rhythm.     Pulses: Normal pulses.     Heart sounds: Normal heart sounds.  Pulmonary:     Effort: Pulmonary effort is normal. No respiratory distress.     Breath sounds: Normal breath sounds.  Chest:     Comments: No chest wall edema, erythema, warmth. Abdominal:     General: Bowel sounds are normal. There is no distension.     Palpations: Abdomen is soft.     Tenderness: There is no abdominal tenderness.  Musculoskeletal:        General: Normal range of motion.     Cervical back: Normal range of motion and neck supple.     Comments: Moves all 4 extremities without difficulty.  Compartment soft.  No bony tenderness.  Skin:    General: Skin is warm and dry.     Capillary Refill: Capillary refill takes less than 2 seconds.     Comments: Left axillary abscess approximately with 3 cm area of fluctuance with approximately 8 cm induration surrounding.  No erythema or warmth.  Does not extend into chest wall, distal arm.  Neurological:     General: No focal deficit present.      Mental Status: He is alert.     Sensory: Sensation is intact.     Motor: Motor function is intact.     Gait: Gait is intact.  Psychiatric:        Mood and Affect: Mood and affect normal.    ED Results / Procedures / Treatments   Labs (all labs ordered are listed, but only abnormal results are displayed) Labs Reviewed - No data to display  EKG None  Radiology No results found.  Procedures . Incision and Drainage  Date/Time: 11/15/2020 2:59 PM Performed by: 11/17/2020,  PA-C Authorized by: Linwood DibblesHenderly, Tanda Morrissey A, PA-C   Consent:    Consent obtained:  Verbal   Consent given by:  Patient   Risks discussed:  Bleeding, incomplete drainage, pain, damage to other organs and infection   Alternatives discussed:  No treatment, delayed treatment, alternative treatment, observation and referral Universal protocol:    Procedure explained and questions answered to patient or proxy's satisfaction: yes     Relevant documents present and verified: yes     Test results available : yes     Imaging studies available: yes     Required blood products, implants, devices, and special equipment available: yes     Site/side marked: yes     Immediately prior to procedure, a time out was called: yes     Patient identity confirmed:  Verbally with patient Location:    Type:  Abscess   Size:  8 cm   Location:  Upper extremity   Upper extremity location:  Arm   Arm location:  R upper arm (Right axilla) Pre-procedure details:    Skin preparation:  Betadine Anesthesia:    Anesthesia method:  Local infiltration   Local anesthetic:  Lidocaine 1% WITH epi Procedure type:    Complexity:  Complex Procedure details:    Ultrasound guidance: no     Needle aspiration: no     Incision types:  Single straight   Incision depth:  Subcutaneous   Scalpel blade:  11   Wound management:  Probed and deloculated, irrigated with saline and extensive cleaning   Drainage:  Purulent   Drainage amount:   Copious   Wound treatment:  Drain placed   Packing materials:  1/2 in gauze Post-procedure details:    Procedure completion:  Tolerated well, no immediate complications   (including critical care time)  Medications Ordered in ED Medications  lidocaine-EPINEPHrine (XYLOCAINE W/EPI) 2 %-1:200000 (PF) injection 10 mL (10 mLs Infiltration Given by Other 11/15/20 1307)  HYDROcodone-acetaminophen (NORCO/VICODIN) 5-325 MG per tablet 1 tablet (1 tablet Oral Given 11/15/20 1308)  cefTRIAXone (ROCEPHIN) injection 1 g (1 g Intramuscular Given 11/15/20 1308)  sterile water (preservative free) injection (  Given 11/15/20 1308)    ED Course  I have reviewed the triage vital signs and the nursing notes.  Pertinent labs & imaging results that were available during my care of the patient were reviewed by me and considered in my medical decision making (see chart for details).  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  He is afebrile, nonseptic, non-ill-appearing.  He is diabetic states his blood sugars have been "normal" at home.  He has no systemic symptoms on exam.  On exam there is a large right axillary abscess which does not extend into distal arm, chest wall.  There is no surrounding erythema or warmth.  He is neurovascularly intact.  His compartments are soft.  He was given Rocephin here in ED.  Will DC home with antibiotics.  Abscess was drained with significant purulent drainage of 50 cc.  Packing was placed after loculations broken and thoroughly irrigated.  Wound hemostatic after recheck.  DC home with some short course of pain medicine.  He will follow-up in 2 days for wound recheck here in the ED or with general surgery.  The patient has been appropriately medically screened and/or stabilized in the ED. I have low suspicion for any other emergent medical condition which would require further screening, evaluation or treatment in  the ED or require inpatient management.  Patient is hemodynamically stable and in no acute distress.  Patient able to ambulate in department prior to ED.  Evaluation does not show acute pathology that would require ongoing or additional emergent interventions while in the emergency department or further inpatient treatment.  I have discussed the diagnosis with the patient and answered all questions.  Pain is been managed while in the emergency department and patient has no further complaints prior to discharge.  Patient is comfortable with plan discussed in room and is stable for discharge at this time.  I have discussed strict return precautions for returning to the emergency department.  Patient was encouraged to follow-up with PCP/specialist refer to at discharge.    MDM Rules/Calculators/A&P                           Final Clinical Impression(s) / ED Diagnoses Final diagnoses:  Abscess    Rx / DC Orders ED Discharge Orders         Ordered    HYDROcodone-acetaminophen (NORCO/VICODIN) 5-325 MG tablet  Every 4 hours PRN        11/15/20 1453    doxycycline (VIBRAMYCIN) 100 MG capsule  2 times daily        11/15/20 1453           Vannessa Godown A, PA-C 11/15/20 1502    Arby Barrette, MD 11/16/20 1109

## 2020-11-15 NOTE — Discharge Instructions (Signed)
Take the antibiotics as prescribed  Warm compress to area.  We want this area draining.  Return to emergency department in 2 days for wound recheck.

## 2020-11-15 NOTE — ED Triage Notes (Signed)
Patient has a large 4-5 inch abscess in his R underarm, had one about 1 month ago in his buttocks area and had to get it surgically removed. Said this one started x4 days ago, endorses pain.

## 2020-11-15 NOTE — ED Notes (Signed)
An After Visit Summary was printed and given to the patient. Discharge instructions given and no further questions at this time.  Pt leaving with wife.  

## 2020-11-17 ENCOUNTER — Other Ambulatory Visit: Payer: Self-pay

## 2020-11-17 ENCOUNTER — Encounter (HOSPITAL_COMMUNITY): Payer: Self-pay

## 2020-11-17 ENCOUNTER — Emergency Department (HOSPITAL_COMMUNITY)
Admission: EM | Admit: 2020-11-17 | Discharge: 2020-11-17 | Disposition: A | Discharge status: Home / Self Care | Payer: Self-pay | Attending: Emergency Medicine | Admitting: Emergency Medicine

## 2020-11-17 DIAGNOSIS — Z794 Long term (current) use of insulin: Secondary | ICD-10-CM | POA: Insufficient documentation

## 2020-11-17 DIAGNOSIS — L02411 Cutaneous abscess of right axilla: Secondary | ICD-10-CM | POA: Insufficient documentation

## 2020-11-17 DIAGNOSIS — E119 Type 2 diabetes mellitus without complications: Secondary | ICD-10-CM | POA: Insufficient documentation

## 2020-11-17 DIAGNOSIS — Z4800 Encounter for change or removal of nonsurgical wound dressing: Secondary | ICD-10-CM | POA: Insufficient documentation

## 2020-11-17 DIAGNOSIS — Z7984 Long term (current) use of oral hypoglycemic drugs: Secondary | ICD-10-CM | POA: Insufficient documentation

## 2020-11-17 DIAGNOSIS — Z79899 Other long term (current) drug therapy: Secondary | ICD-10-CM | POA: Insufficient documentation

## 2020-11-17 DIAGNOSIS — Z5189 Encounter for other specified aftercare: Secondary | ICD-10-CM

## 2020-11-17 DIAGNOSIS — I10 Essential (primary) hypertension: Secondary | ICD-10-CM | POA: Insufficient documentation

## 2020-11-17 MED ORDER — DOXYCYCLINE HYCLATE 100 MG PO CAPS: 100.0000 mg | ORAL_CAPSULE | Freq: Two times a day (BID) | ORAL | 0 refills | Status: AC

## 2020-11-17 NOTE — ED Triage Notes (Signed)
Patient here for wound check. Patient reports abscess was drained under his R underarm 2 days ago. Patient denies and reports some drainage.    A/[facility] in triage.

## 2020-11-17 NOTE — Discharge Instructions (Addendum)
Seen here for a wound check.  Wound looks reassuring.  I prescribed you antibiotics please take as prescribed.  I recommend changing the bandages and applying warm compresses to the area twice a day for the next 7 days.  You may take over-the-counter pain medications like ibuprofen or Tylenol every 6 as needed please follow dosing back of bottle.  Call community health and wellness as a can help you find a primary care provider  Come back to the emergency department if you develop chest pain, shortness of breath, severe abdominal pain, uncontrolled nausea, vomiting, diarrhea.

## 2020-11-17 NOTE — ED Provider Notes (Signed)
[facility]-EMERGENCY DEPT Provider Note   CSN: [id number] Arrival date & time: 11/17/20  1517     History Chief Complaint  Patient presents with  . Wound Check    Story Sean Foster is a 44 y.o. male.  HPI   Patient with significant medical history of diabetes, hypertension, recurrent abscess presents to emergency department for wound check.  He was seen in the emergency department on 12/22 for abscess under his right axillary.  Abscess was successfully drained and packing was placed.  He was given a prescription for doxycycline.  Patient endorses that he has not been able to pick up his doxycycline as the pharmacy did not receive it.  He denies systemic symptoms like fevers, chills, general body aches, rash.  He states the wound has been draining well, states his pain has improved significantly and states he is feeling much better.  Patient has no complaints at this time.  He denies headaches, fevers, chills, shortness breath, chest pain, abdominal pain, nausea, vomiting, diarrhea, pedal edema.  Past Medical History:  Diagnosis Date  . Diabetes mellitus without complication (HCC)   . Hypertension     Patient Active Problem List   Diagnosis Date Noted  . Left buttock abscess 04/16/2020  . Diabetes (HCC) 04/16/2020  . Cellulitis of left buttock 04/16/2020    Past Surgical History:  Procedure Laterality Date  . INCISION AND DRAINAGE ABSCESS Left 04/16/2020   Procedure: INCISION AND DRAINAGE BUTTOCK ABSCESS;  Surgeon: Darnell Level, MD;  Location: [facility];  Service: General;  Laterality: Left;       No family history on file.  Social History   Tobacco Use  . Smoking status: Never Smoker  . Smokeless tobacco: Never Used  Vaping Use  . Vaping Use: Never used  Substance Use Topics  . Alcohol use: Not Currently    Comment: 1 drink per week  . Drug use: Not Currently    Home Medications Prior to Admission medications   Medication Sig Start Date End Date  Taking? Authorizing Provider  acetaminophen (TYLENOL) 500 MG tablet Take 1,000 mg by mouth every 6 (six) hours as needed for mild pain or headache.    [provider]  cyclobenzaprine (FLEXERIL) 10 MG tablet Take 1 tablet (10 mg total) by mouth 2 (two) times daily as needed for muscle spasms. 03/21/20   Linwood Dibbles, MD  doxycycline (VIBRAMYCIN) 100 MG capsule Take 1 capsule (100 mg total) by mouth 2 (two) times daily. 11/15/20   Henderly, Britni A, PA-C  doxycycline (VIBRAMYCIN) 100 MG capsule Take 1 capsule (100 mg total) by mouth 2 (two) times daily for 7 days. 11/17/20 11/24/20  Carroll Sage, PA-C  hydrochlorothiazide (HYDRODIURIL) 25 MG tablet Take 1 tablet (25 mg total) by mouth daily. 06/16/19   Renne Crigler, PA-C  HYDROcodone-acetaminophen (NORCO/VICODIN) 5-325 MG tablet Take 1 tablet by mouth every 4 (four) hours as needed. 11/15/20   Henderly, Britni A, PA-C  ibuprofen (IBU) 600 MG tablet Take 1 tablet (600 mg total) by mouth every 6 (six) hours as needed. 03/21/20   Linwood Dibbles, MD  insulin glargine (LANTUS) 100 UNIT/ML Solostar Pen Inject 15 Units into the skin at bedtime. 04/17/20 05/17/20  Barnetta Chapel, PA-C  Insulin Pen Needle (PEN NEEDLES 3/16") 31G X 5 MM MISC 1 each by Does not apply route daily. 04/17/20   Barnetta Chapel, PA-C  metFORMIN (GLUCOPHAGE) 500 MG tablet Take 1 tablet (500 mg total) by mouth 2 (two) times daily with a  meal. 04/17/20 05/17/20  Barnetta Chapel, PA-C  oxyCODONE (OXY IR/ROXICODONE) 5 MG immediate release tablet Take 1-2 tablets (5-10 mg total) by mouth every 6 (six) hours as needed for moderate pain. 04/17/20   Barnetta Chapel, PA-C  amLODipine (NORVASC) 5 MG tablet Take 1 tablet (5 mg total) by mouth daily. Patient not taking: Reported on 06/16/2019 02/24/18 06/16/19  Raeford Razor, MD    Allergies    Chloroquine, Chloroquine, Chlorothen, and Chlorothen  Review of Systems   Review of Systems  Constitutional: Negative for chills and fever.  HENT:  Negative for congestion.   Respiratory: Negative for shortness of breath.   Cardiovascular: Negative for chest pain.  Gastrointestinal: Negative for abdominal pain, diarrhea, nausea and vomiting.  Genitourinary: Negative for enuresis.  Musculoskeletal: Negative for back pain.  Skin: Positive for wound. Negative for rash.       Right axilla  Neurological: Negative for headaches.  Hematological: Does not bruise/bleed easily.    Physical Exam Updated Vital Signs BP (!) 170/102 (BP Location: Left Arm)   Pulse 82   Temp 97.9 F (36.6 C) (Oral)   Resp 16   Ht 5\' 10"  (1.778 m)   Wt 113.4 kg   SpO2 100%   BMI 35.87 kg/m   Physical Exam Vitals and nursing note reviewed.  Constitutional:      General: He is not in acute distress.    Appearance: He is not ill-appearing.  HENT:     Head: Normocephalic and atraumatic.     Nose: No congestion.  Eyes:     Conjunctiva/sclera: Conjunctivae normal.  Cardiovascular:     Rate and Rhythm: Normal rate.  Pulmonary:     Effort: Pulmonary effort is normal.  Musculoskeletal:     Comments: Patient is moving all 4 extremities at difficulty.  Skin:    General: Skin is warm and dry.     Comments: Patient has a noted incision under his right axilla, measuring approximately 3 cm in length, there was surrounding erythema present, approximately 5 cm of packing was removed from room.  There was no discharge or drainage present.  Wound was thoroughly inspected there is no signs of infection, all packing was removed.  Wound healing nicely  Neurological:     Mental Status: He is alert.  Psychiatric:        Mood and Affect: Mood normal.     ED Results / Procedures / Treatments   Labs (all labs ordered are listed, but only abnormal results are displayed) Labs Reviewed - No data to display  EKG None  Radiology No results found.  Procedures Procedures (including critical care time)  Medications Ordered in ED Medications - No data to  display  ED Course  I have reviewed the triage vital signs and the nursing notes.  Pertinent labs & imaging results that were available during my care of the patient were reviewed by me and considered in my medical decision making (see chart for details).    MDM Rules/Calculators/A&P                          Patient presents for a wound check.  He is alert, does not appear in acute distress, vital signs reassuring.  Due to well-appearing patient, benign physical exam further lab or imaging not warranted at this time.  I have low suspicion for worsening abscess or overlying cellulitis as there is no signs of infection on examination, there is no fluctuance or  indurations felt.  Low suspicion for systemic infection as patient is nontoxic-appearing, vital signs reassuring.  Patient's wound is healing well after I$D, will send in new prescription for doxycycline as he was originally prescribed this and has history of recurrent abscesses.  Will recommend continued wound care and following up with his PCP in 7 days time.  Vital signs have remained stable, no indication for hospital admission.  Patient given at home care as well strict return precautions.  Patient verbalized that they understood agreed to said plan.  Final Clinical Impression(s) / ED Diagnoses Final diagnoses:  Visit for wound check    Rx / DC Orders ED Discharge Orders         Ordered    doxycycline (VIBRAMYCIN) 100 MG capsule  2 times daily        11/17/20 0950           Carroll Sage, PA-C 11/17/20 1014    Tegeler, Canary Brim, MD 11/17/20 1531

## 2021-05-16 ENCOUNTER — Other Ambulatory Visit: Payer: Self-pay

## 2021-05-16 ENCOUNTER — Encounter (HOSPITAL_COMMUNITY): Payer: Self-pay | Admitting: *Deleted

## 2021-05-16 ENCOUNTER — Emergency Department (HOSPITAL_COMMUNITY)
Admission: EM | Admit: 2021-05-16 | Discharge: 2021-05-16 | Disposition: A | Discharge status: Home / Self Care | Payer: BLUE CROSS/BLUE SHIELD | Attending: Emergency Medicine | Admitting: Emergency Medicine

## 2021-05-16 ENCOUNTER — Emergency Department (HOSPITAL_COMMUNITY): Payer: BLUE CROSS/BLUE SHIELD

## 2021-05-16 ENCOUNTER — Other Ambulatory Visit (HOSPITAL_COMMUNITY): Payer: Self-pay

## 2021-05-16 DIAGNOSIS — Z7984 Long term (current) use of oral hypoglycemic drugs: Secondary | ICD-10-CM | POA: Diagnosis not present

## 2021-05-16 DIAGNOSIS — I10 Essential (primary) hypertension: Secondary | ICD-10-CM | POA: Insufficient documentation

## 2021-05-16 DIAGNOSIS — Z794 Long term (current) use of insulin: Secondary | ICD-10-CM | POA: Diagnosis not present

## 2021-05-16 DIAGNOSIS — X509XXA Other and unspecified overexertion or strenuous movements or postures, initial encounter: Secondary | ICD-10-CM | POA: Diagnosis not present

## 2021-05-16 DIAGNOSIS — K5900 Constipation, unspecified: Secondary | ICD-10-CM | POA: Diagnosis not present

## 2021-05-16 DIAGNOSIS — S3992XA Unspecified injury of lower back, initial encounter: Secondary | ICD-10-CM | POA: Diagnosis present

## 2021-05-16 DIAGNOSIS — S39012A Strain of muscle, fascia and tendon of lower back, initial encounter: Secondary | ICD-10-CM | POA: Insufficient documentation

## 2021-05-16 DIAGNOSIS — Z79899 Other long term (current) drug therapy: Secondary | ICD-10-CM | POA: Insufficient documentation

## 2021-05-16 DIAGNOSIS — E119 Type 2 diabetes mellitus without complications: Secondary | ICD-10-CM | POA: Insufficient documentation

## 2021-05-16 LAB — COMPREHENSIVE METABOLIC PANEL
ALT: 17 U/L (ref 0–44)
AST: 52 U/L — ABNORMAL HIGH (ref 15–41)
Albumin: 4 g/dL (ref 3.5–5.0)
Alkaline Phosphatase: 256 U/L — ABNORMAL HIGH (ref 38–126)
Anion gap: 7 (ref 5–15)
BUN: 13 mg/dL (ref 6–20)
CO2: 26 mmol/L (ref 22–32)
Calcium: 9.3 mg/dL (ref 8.9–10.3)
Chloride: 100 mmol/L (ref 98–111)
Creatinine, Ser: 0.94 mg/dL (ref 0.61–1.24)
GFR, Estimated: >60 mL/min (ref >60)
Glucose, Bld: 327 mg/dL — ABNORMAL HIGH (ref 70–99)
Potassium: 4.1 mmol/L (ref 3.5–5.1)
Sodium: 133 mmol/L — ABNORMAL LOW (ref 135–145)
Total Bilirubin: 0.7 mg/dL (ref 0.3–1.2)
Total Protein: 7.5 g/dL (ref 6.5–8.1)

## 2021-05-16 LAB — CBC WITH DIFFERENTIAL/PLATELET
Abs Immature Granulocytes: 0.01 10*3/uL (ref 0.00–0.07)
Basophils Absolute: 0 10*3/uL (ref 0.0–0.1)
Basophils Relative: 1 %
Eosinophils Absolute: 0.1 10*3/uL (ref 0.0–0.5)
Eosinophils Relative: 2 %
HCT: 39.8 % (ref 39.0–52.0)
Hemoglobin: 12 g/dL — ABNORMAL LOW (ref 13.0–17.0)
Immature Granulocytes: 0 %
Lymphocytes Relative: 43 %
Lymphs Abs: 1.8 10*3/uL (ref 0.7–4.0)
MCH: 22.3 pg — ABNORMAL LOW (ref 26.0–34.0)
MCHC: 30.2 g/dL (ref 30.0–36.0)
MCV: 74 fL — ABNORMAL LOW (ref 80.0–100.0)
Monocytes Absolute: 0.3 10*3/uL (ref 0.1–1.0)
Monocytes Relative: 7 %
Neutro Abs: 2 10*3/uL (ref 1.7–7.7)
Neutrophils Relative %: 47 %
Platelets: 203 10*3/uL (ref 150–400)
RBC: 5.38 MIL/uL (ref 4.22–5.81)
RDW: 14.1 % (ref 11.5–15.5)
WBC: 4.1 10*3/uL (ref 4.0–10.5)
nRBC: 0 % (ref 0.0–0.2)

## 2021-05-16 LAB — URINALYSIS, ROUTINE W REFLEX MICROSCOPIC
Bacteria, UA: NONE SEEN
Bilirubin Urine: NEGATIVE
Glucose, UA: >=500 mg/dL — AB
Hgb urine dipstick: NEGATIVE
Ketones, ur: NEGATIVE mg/dL
Leukocytes,Ua: NEGATIVE
Nitrite: NEGATIVE
Protein, ur: NEGATIVE mg/dL
Specific Gravity, Urine: 1.03 (ref 1.005–1.030)
pH: 7 (ref 5.0–8.0)

## 2021-05-16 MED ORDER — METHOCARBAMOL 500 MG PO TABS
500.0000 mg | ORAL_TABLET | Freq: Two times a day (BID) | ORAL | 0 refills | Status: DC
  Filled 2021-05-16: qty 10, 5d supply, fill #0

## 2021-05-16 MED ORDER — IBUPROFEN 800 MG PO TABS
800.0000 mg | ORAL_TABLET | Freq: Three times a day (TID) | ORAL | 0 refills | Status: AC
  Filled 2021-05-16: qty 21, 7d supply, fill #0

## 2021-05-16 MED ORDER — KETOROLAC TROMETHAMINE 60 MG/2ML IM SOLN: 60.0000 mg | Freq: Once | INTRAMUSCULAR | Status: DC

## 2021-05-16 MED ORDER — KETOROLAC TROMETHAMINE 30 MG/ML IJ SOLN
30.0000 mg | Freq: Once | INTRAMUSCULAR | Status: AC
  Administered 2021-05-16: 10:00:00 30 mg via INTRAVENOUS
  Filled 2021-05-16: qty 1

## 2021-05-16 NOTE — Discharge Instructions (Addendum)
You were evaluated in the Emergency Department and after careful evaluation, we did not find any emergent condition requiring admission or further testing in the hospital.  Your symptoms are likely due to a low back strain.  You may take 800 mg of ibuprofen up to 3 times daily for a week.  You may also take the muscle relaxer at night, do not drink or drive on this medication as it can make you sleepy.  You may also use the low back stretches I provided in your discharge paperwork.  You may use topical Voltaren gel which could be found at your local pharmacy.  If your symptoms continue, please follow-up with your primary care doctor as you may require further therapy.  In terms of your constipation, please take over-the-counter laxative such as MiraLAX.  Please return to the Emergency Department if you experience any worsening of your condition.   Thank you for allowing [place] to be a part of your care.

## 2021-05-16 NOTE — ED Triage Notes (Signed)
Pt complains of right flank pain x 3 days, began hurting in left flank this morning. Also having constipation x 3 days.

## 2021-05-16 NOTE — ED Provider Notes (Signed)
[facility] DEPT Provider Note   CSN: [id number] Arrival date & time: 05/16/21  0840     History Chief Complaint  Patient presents with   Flank Pain   Constipation    Sean Foster is a 45 y.o. male.  HPI 44 year old male with a history of DM type II, hypertension presents to the ER with complaints of right lower back pain which occurred about 3 days ago.  Patient states that he was on a ladder reaching for something and felt a pull in his right lower back.  Since then he has been having right lower back pain, worsened with movement.  He also reports that this morning he woke up and on the left side of his back is painful.  Denies any falls.  He denies any loss of bowel bladder control, no numbness or tingling in his lower extremities.  He has taken ibuprofen and has been using ice with some relief.  He also complains of constipation and straining over the last several days.  He has had several small bowel movements over the last several days.  Denies any nausea or vomiting, no abdominal pain.  No history of constipation.  He denies any chest pain, shortness of breath, dizziness, syncope.  No history of IV drug use, no unintended weight loss, night sweats.    Past Medical History:  Diagnosis Date   Diabetes mellitus without complication ([place])    Hypertension     Patient Active Problem List   Diagnosis Date Noted   Left buttock abscess 04/16/2020   Diabetes ([place]) 04/16/2020   Cellulitis of left buttock 04/16/2020    Past Surgical History:  Procedure Laterality Date   INCISION AND DRAINAGE ABSCESS Left 04/16/2020   Procedure: INCISION AND DRAINAGE BUTTOCK ABSCESS;  Surgeon: Armandina Gemma, MD;  Location: [facility];  Service: General;  Laterality: Left;       No family history on file.  Social History   Tobacco Use   Smoking status: Never   Smokeless tobacco: Never  Vaping Use   Vaping Use: Never used  Substance Use Topics   Alcohol use: Not  Currently    Comment: 1 drink per week   Drug use: Not Currently    Home Medications Prior to Admission medications   Medication Sig Start Date End Date Taking? Authorizing Provider  ibuprofen (ADVIL) 800 MG tablet Take 1 tablet (800 mg total) by mouth 3 (three) times daily for 7 days. 05/16/21 05/23/21 Yes Garald Balding, PA-C  methocarbamol (ROBAXIN) 500 MG tablet Take 1 tablet (500 mg total) by mouth 2 (two) times daily. 05/16/21  Yes Garald Balding, PA-C  acetaminophen (TYLENOL) 500 MG tablet Take 1,000 mg by mouth every 6 (six) hours as needed for mild pain or headache.    [provider]  cyclobenzaprine (FLEXERIL) 10 MG tablet Take 1 tablet (10 mg total) by mouth 2 (two) times daily as needed for muscle spasms. 03/21/20   Dorie Rank, MD  doxycycline (VIBRAMYCIN) 100 MG capsule Take 1 capsule (100 mg total) by mouth 2 (two) times daily. 11/15/20   Henderly, Britni A, PA-C  hydrochlorothiazide (HYDRODIURIL) 25 MG tablet Take 1 tablet (25 mg total) by mouth daily. 06/16/19   Carlisle Cater, PA-C  HYDROcodone-acetaminophen (NORCO/VICODIN) 5-325 MG tablet Take 1 tablet by mouth every 4 (four) hours as needed. 11/15/20   Henderly, Britni A, PA-C  insulin glargine (LANTUS) 100 UNIT/ML Solostar Pen Inject 15 Units into the skin at bedtime. 04/17/20 05/17/20  Saverio Danker, PA-C  Insulin Pen Needle (PEN NEEDLES 3/16") 31G X 5 MM MISC 1 each by Does not apply route daily. 04/17/20   Saverio Danker, PA-C  metFORMIN (GLUCOPHAGE) 500 MG tablet Take 1 tablet (500 mg total) by mouth 2 (two) times daily with a meal. 04/17/20 05/17/20  Saverio Danker, PA-C  oxyCODONE (OXY IR/ROXICODONE) 5 MG immediate release tablet Take 1-2 tablets (5-10 mg total) by mouth every 6 (six) hours as needed for moderate pain. 04/17/20   Saverio Danker, PA-C  amLODipine (NORVASC) 5 MG tablet Take 1 tablet (5 mg total) by mouth daily. Patient not taking: Reported on 06/16/2019 02/24/18 06/16/19  Virgel Manifold, MD    Allergies     Chloroquine, Chloroquine, Chlorothen, and Chlorothen  Review of Systems   Review of Systems  Constitutional:  Negative for chills and fever.  HENT:  Negative for ear pain and sore throat.   Eyes:  Negative for pain and visual disturbance.  Respiratory:  Negative for cough and shortness of breath.   Cardiovascular:  Negative for chest pain and palpitations.  Gastrointestinal:  Positive for constipation. Negative for abdominal pain and vomiting.  Genitourinary:  Negative for dysuria and hematuria.  Musculoskeletal:  Positive for back pain. Negative for arthralgias.  Skin:  Negative for color change and rash.  Neurological:  Negative for seizures, syncope, weakness and numbness.  All other systems reviewed and are negative.  Physical Exam Updated Vital Signs BP (!) 193/123   Pulse 71   Temp 98.9 F (37.2 C) (Oral)   Resp 18   SpO2 100%   Physical Exam Vitals reviewed.  Constitutional:      Appearance: Normal appearance.  HENT:     Head: Normocephalic and atraumatic.  Eyes:     General:        Right eye: No discharge.        Left eye: No discharge.     Extraocular Movements: Extraocular movements intact.     Conjunctiva/sclera: Conjunctivae normal.  Cardiovascular:     Pulses: Normal pulses.     Heart sounds: Normal heart sounds.  Abdominal:     General: Abdomen is flat.     Tenderness: There is no abdominal tenderness.     Comments: Abdomen is soft, nontender  Musculoskeletal:        General: Tenderness present. No swelling, deformity or signs of injury. Normal range of motion.     Comments: No C, T, L-spine tenderness.  Some associated paraspinal muscle tenderness to the lumbar spine bilaterally.  5/5 strength in upper and lower extremities.  No noticeable step-offs, crepitus, fluctuance, erythema.  Sensations intact.  Full range of motion and strength of neck. Moving all 4 extremities without difficulty.     Skin:    General: Skin is warm and dry.     Findings: No  rash.  Neurological:     General: No focal deficit present.     Mental Status: He is alert and oriented to person, place, and time.  Psychiatric:        Mood and Affect: Mood normal.        Behavior: Behavior normal.    ED Results / Procedures / Treatments   Labs (all labs ordered are listed, but only abnormal results are displayed) Labs Reviewed  CBC WITH DIFFERENTIAL/PLATELET - Abnormal; Notable for the following components:      Result Value   Hemoglobin 12.0 (*)    MCV 74.0 (*)    MCH 22.3 (*)  All other components within normal limits  COMPREHENSIVE METABOLIC PANEL - Abnormal; Notable for the following components:   Sodium 133 (*)    Glucose, Bld 327 (*)    AST 52 (*)    Alkaline Phosphatase 256 (*)    All other components within normal limits  URINALYSIS, ROUTINE W REFLEX MICROSCOPIC - Abnormal; Notable for the following components:   Glucose, UA >=500 (*)    All other components within normal limits    EKG None  Radiology DG Abdomen 1 View  Result Date: 05/16/2021 CLINICAL DATA:  Constipation. EXAM: ABDOMEN - 1 VIEW COMPARISON:  CT 03/21/2020. FINDINGS: Soft tissue structures are unremarkable. Single nonspecific air-filled loops of small bowel noted. Large stool noted throughout the colon. Colon is nondistended. No free air. IMPRESSION: Large stool volume noted throughout the colon. This is consistent with constipation. Electronically Signed   By: Marcello Moores  Register   On: 05/16/2021 10:27    Procedures Procedures   Medications Ordered in ED Medications  ketorolac (TORADOL) 30 MG/ML injection 30 mg (30 mg Intravenous Given 05/16/21 1004)    ED Course  I have reviewed the triage vital signs and the nursing notes.  Pertinent labs & imaging results that were available during my care of the patient were reviewed by me and considered in my medical decision making (see chart for details).    MDM Rules/Calculators/A&P                          45 year old male  presents to the ER with complaints of low back pain.  On arrival, vitals overall reassuring, though he is hypertensive with a blood pressure of 186/112, this improved on its own throughout the ED course to 157/108.  Other vitals are reassuring.  No fevers.  Physical exam overall reassuring, no midline tenderness to the C, T, L-spine, some mild associated paraspinal muscle tenderness bilaterally to the L-spine.  He has equal strength in upper and lower extremities bilaterally.  Moving all 4 extremities without difficulty.  Neurovascularly intact.  Abdomen is soft and nontender.   DDx includes lumbar strain, renal stones, pyelonephritis, small bowel obstruction/constipation, less likely dissection  Labs and imaging ordered, reviewed and interpreted by me.  CBC without leukocytosis, stable hemoglobin.  CMP with a glucose of 327, mild AST elevation of 52, alk phos of 256.  Per chart review, he does have a history of elevated alk phos of 186 a year ago.  Plan for KUB, Toradol for pain.  Lower suspicion for acute abdomen given no tenderness.  Lower suspicion for gallbladder pathology, low suspicion for small bowel obstruction.  UA without evidence of blood, lower suspicion for renal stones.  UA without evidence of infection. Do not think he has any signs of cauda equina, low suspicion for spinal abscess.  Suspect lumbar strain.  Toradol provided here in the ER.  KUB consistent with constipation.  Patient prescribed muscle relaxers, instructed to not drink or drive on the medication secondary to sedating side effects.  Encouraged continuation of ibuprofen.  Encourage Voltaren gel.  Gentle stretching.  Encouraged to the counter laxatives.  Stressed PCP follow-up.  We discussed return precautions.  He was understanding and is agreeable.  Stable for discharge.  Final Clinical Impression(s) / ED Diagnoses Final diagnoses:  Constipation, unspecified constipation type  Lumbar strain, initial encounter    Rx / DC  Orders ED Discharge Orders          Ordered  methocarbamol (ROBAXIN) 500 MG tablet  2 times daily        05/16/21 1038    ibuprofen (ADVIL) 800 MG tablet  3 times daily        05/16/21 1038             Lyndel Safe 05/16/21 1039    Valarie Merino, MD 05/16/21 1458

## 2021-10-14 ENCOUNTER — Other Ambulatory Visit: Payer: Self-pay

## 2021-10-14 ENCOUNTER — Encounter (HOSPITAL_COMMUNITY): Payer: Self-pay | Admitting: Emergency Medicine

## 2021-10-14 ENCOUNTER — Emergency Department (HOSPITAL_COMMUNITY)
Admission: EM | Admit: 2021-10-14 | Discharge: 2021-10-14 | Disposition: A | Discharge status: Home / Self Care | Payer: BLUE CROSS/BLUE SHIELD | Attending: Emergency Medicine | Admitting: Emergency Medicine

## 2021-10-14 DIAGNOSIS — R7989 Other specified abnormal findings of blood chemistry: Secondary | ICD-10-CM | POA: Insufficient documentation

## 2021-10-14 DIAGNOSIS — I160 Hypertensive urgency: Secondary | ICD-10-CM | POA: Diagnosis not present

## 2021-10-14 DIAGNOSIS — R410 Disorientation, unspecified: Secondary | ICD-10-CM | POA: Diagnosis not present

## 2021-10-14 DIAGNOSIS — Z7984 Long term (current) use of oral hypoglycemic drugs: Secondary | ICD-10-CM | POA: Diagnosis not present

## 2021-10-14 DIAGNOSIS — Z794 Long term (current) use of insulin: Secondary | ICD-10-CM | POA: Insufficient documentation

## 2021-10-14 DIAGNOSIS — I10 Essential (primary) hypertension: Secondary | ICD-10-CM | POA: Diagnosis not present

## 2021-10-14 DIAGNOSIS — E119 Type 2 diabetes mellitus without complications: Secondary | ICD-10-CM | POA: Insufficient documentation

## 2021-10-14 DIAGNOSIS — Z711 Person with feared health complaint in whom no diagnosis is made: Secondary | ICD-10-CM | POA: Diagnosis not present

## 2021-10-14 DIAGNOSIS — Z79899 Other long term (current) drug therapy: Secondary | ICD-10-CM | POA: Insufficient documentation

## 2021-10-14 LAB — COMPREHENSIVE METABOLIC PANEL
ALT: 48 U/L — ABNORMAL HIGH (ref 0–44)
AST: 257 U/L — ABNORMAL HIGH (ref 15–41)
Albumin: 4.1 g/dL (ref 3.5–5.0)
Alkaline Phosphatase: 267 U/L — ABNORMAL HIGH (ref 38–126)
Anion gap: 7 (ref 5–15)
BUN: 13 mg/dL (ref 6–20)
CO2: 24 mmol/L (ref 22–32)
Calcium: 8.8 mg/dL — ABNORMAL LOW (ref 8.9–10.3)
Chloride: 102 mmol/L (ref 98–111)
Creatinine, Ser: 0.73 mg/dL (ref 0.61–1.24)
GFR, Estimated: >60 mL/min (ref >60)
Glucose, Bld: 280 mg/dL — ABNORMAL HIGH (ref 70–99)
Potassium: 3.9 mmol/L (ref 3.5–5.1)
Sodium: 133 mmol/L — ABNORMAL LOW (ref 135–145)
Total Bilirubin: 1 mg/dL (ref 0.3–1.2)
Total Protein: 7.7 g/dL (ref 6.5–8.1)

## 2021-10-14 LAB — ACETAMINOPHEN LEVEL: Acetaminophen (Tylenol), Serum: <10 ug/mL — ABNORMAL LOW (ref 10–30)

## 2021-10-14 LAB — CBC
HCT: 45.7 % (ref 39.0–52.0)
Hemoglobin: 13.8 g/dL (ref 13.0–17.0)
MCH: 22.4 pg — ABNORMAL LOW (ref 26.0–34.0)
MCHC: 30.2 g/dL (ref 30.0–36.0)
MCV: 74.1 fL — ABNORMAL LOW (ref 80.0–100.0)
Platelets: 245 10*3/uL (ref 150–400)
RBC: 6.17 MIL/uL — ABNORMAL HIGH (ref 4.22–5.81)
RDW: 15.7 % — ABNORMAL HIGH (ref 11.5–15.5)
WBC: 4.1 10*3/uL (ref 4.0–10.5)
nRBC: 0 % (ref 0.0–0.2)

## 2021-10-14 LAB — ETHANOL: Alcohol, Ethyl (B): <10 mg/dL (ref <10)

## 2021-10-14 LAB — SALICYLATE LEVEL: Salicylate Lvl: <7 mg/dL — ABNORMAL LOW (ref 7.0–30.0)

## 2021-10-14 NOTE — ED Provider Notes (Signed)
[facility]-EMERGENCY DEPT Provider Note   CSN: [id number] Arrival date & time: 10/14/21  1751     History No chief complaint on file.   Sean Foster is a 45 y.o. male.  HPI 45 year old male brought in by police for mental health evaluation.  The patient just got back from a visit to [place].  The patient tells me that he has been having a lot of thoughts going through his head and is "confused".  When he explains this it seems that he is describing that he has been having a lot of stressors at work.  He was trying to reflect on his thoughts.  He drove his car around and then got out and went to go look at the water.  The police told the charge nurse that he was walking in the street.  Charge nurse wrote that he was SI, though when I clarified with her she states he never said the word suicidal but was saying that he felt "confused" and was brought here for evaluation.  I talked to his wife over the phone who states that she has no concerns for him harming himself.  He is been having some issues with sleep and going to bed at abnormal times due to just getting back from [place] couple days ago.  He has not been sick otherwise.  Past Medical History:  Diagnosis Date   Diabetes mellitus without complication (HCC)    Hypertension     Patient Active Problem List   Diagnosis Date Noted   Left buttock abscess 04/16/2020   Diabetes (HCC) 04/16/2020   Cellulitis of left buttock 04/16/2020    Past Surgical History:  Procedure Laterality Date   INCISION AND DRAINAGE ABSCESS Left 04/16/2020   Procedure: INCISION AND DRAINAGE BUTTOCK ABSCESS;  Surgeon: Darnell Level, MD;  Location: [facility];  Service: General;  Laterality: Left;       No family history on file.  Social History   Tobacco Use   Smoking status: Never   Smokeless tobacco: Never  Vaping Use   Vaping Use: Never used  Substance Use Topics   Alcohol use: Not Currently    Comment: 1 drink per week    Drug use: Not Currently    Home Medications Prior to Admission medications   Medication Sig Start Date End Date Taking? Authorizing Provider  acetaminophen (TYLENOL) 500 MG tablet Take 1,000 mg by mouth every 6 (six) hours as needed for mild pain or headache.    [provider]  cyclobenzaprine (FLEXERIL) 10 MG tablet Take 1 tablet (10 mg total) by mouth 2 (two) times daily as needed for muscle spasms. 03/21/20   Linwood Dibbles, MD  doxycycline (VIBRAMYCIN) 100 MG capsule Take 1 capsule (100 mg total) by mouth 2 (two) times daily. 11/15/20   Henderly, Britni A, PA-C  hydrochlorothiazide (HYDRODIURIL) 25 MG tablet Take 1 tablet (25 mg total) by mouth daily. 06/16/19   Renne Crigler, PA-C  HYDROcodone-acetaminophen (NORCO/VICODIN) 5-325 MG tablet Take 1 tablet by mouth every 4 (four) hours as needed. 11/15/20   Henderly, Britni A, PA-C  insulin glargine (LANTUS) 100 UNIT/ML Solostar Pen Inject 15 Units into the skin at bedtime. 04/17/20 05/17/20  Barnetta Chapel, PA-C  Insulin Pen Needle (PEN NEEDLES 3/16") 31G X 5 MM MISC 1 each by Does not apply route daily. 04/17/20   Barnetta Chapel, PA-C  metFORMIN (GLUCOPHAGE) 500 MG tablet Take 1 tablet (500 mg total) by mouth 2 (two) times daily with a meal.  04/17/20 05/17/20  Barnetta Chapel, PA-C  methocarbamol (ROBAXIN) 500 MG tablet Take 1 tablet (500 mg total) by mouth 2 (two) times daily. 05/16/21   Mare Ferrari, PA-C  oxyCODONE (OXY IR/ROXICODONE) 5 MG immediate release tablet Take 1-2 tablets (5-10 mg total) by mouth every 6 (six) hours as needed for moderate pain. 04/17/20   Barnetta Chapel, PA-C  amLODipine (NORVASC) 5 MG tablet Take 1 tablet (5 mg total) by mouth daily. Patient not taking: Reported on 06/16/2019 02/24/18 06/16/19  Raeford Razor, MD    Allergies    Chloroquine, Chloroquine, Chlorothen, and Chlorothen  Review of Systems   Review of Systems  Constitutional:  Negative for fever.  Cardiovascular:  Negative for chest pain.   Neurological:  Negative for headaches.  All other systems reviewed and are negative.  Physical Exam Updated Vital Signs BP (!) 190/116 (BP Location: Left Arm)   Pulse 85   Temp 98.5 F (36.9 C) (Oral)   Resp 18   SpO2 97%   Physical Exam Vitals and nursing note reviewed.  Constitutional:      Appearance: He is well-developed.  HENT:     Head: Normocephalic and atraumatic.     Right Ear: External ear normal.     Left Ear: External ear normal.     Nose: Nose normal.  Eyes:     General:        Right eye: No discharge.        Left eye: No discharge.     Extraocular Movements: Extraocular movements intact.     Pupils: Pupils are equal, round, and reactive to light.  Cardiovascular:     Rate and Rhythm: Normal rate and regular rhythm.     Heart sounds: Normal heart sounds.  Pulmonary:     Effort: Pulmonary effort is normal.     Breath sounds: Normal breath sounds.  Abdominal:     General: There is no distension.     Palpations: Abdomen is soft.     Tenderness: There is no abdominal tenderness.  Musculoskeletal:     Cervical back: Neck supple.  Skin:    General: Skin is warm and dry.  Neurological:     Mental Status: He is alert and oriented to person, place, and time.     Comments: CN 3-12 grossly intact. 5/5 strength in all 4 extremities. Grossly normal sensation. Normal finger to nose.   Psychiatric:        Mood and Affect: Mood is not anxious.    ED Results / Procedures / Treatments   Labs (all labs ordered are listed, but only abnormal results are displayed) Labs Reviewed  COMPREHENSIVE METABOLIC PANEL - Abnormal; Notable for the following components:      Result Value   Sodium 133 (*)    Glucose, Bld 280 (*)    Calcium 8.8 (*)    AST 257 (*)    ALT 48 (*)    Alkaline Phosphatase 267 (*)    All other components within normal limits  SALICYLATE LEVEL - Abnormal; Notable for the following components:   Salicylate Lvl <7.0 (*)    All other components within  normal limits  ACETAMINOPHEN LEVEL - Abnormal; Notable for the following components:   Acetaminophen (Tylenol), Serum <10 (*)    All other components within normal limits  CBC - Abnormal; Notable for the following components:   RBC 6.17 (*)    MCV 74.1 (*)    MCH 22.4 (*)    RDW 15.7 (*)  All other components within normal limits  ETHANOL  RAPID URINE DRUG SCREEN, HOSP PERFORMED    EKG None  Radiology No results found.  Procedures Procedures   Medications Ordered in ED Medications - No data to display  ED Course  I have reviewed the triage vital signs and the nursing notes.  Pertinent labs & imaging results that were available during my care of the patient were reviewed by me and considered in my medical decision making (see chart for details).    MDM Rules/Calculators/A&P                           Patient is denying any SI/HI.  He is not acutely altered or psychotic.  He is quite hypertensive and states he has not taken his blood pressure meds in the last couple days.  He was encouraged to take this.  He also was noted to have abnormal LFTs on the triage labs ordered though this has also been a previous problem and he will need to follow-up with his PCP.  I do not think he is acutely psychotic or mentally unsafe or harm to himself.  He will be discharged.  He was offered a mental health evaluation but he declines. Final Clinical Impression(s) / ED Diagnoses Final diagnoses:  Mental health-related complaint  Hypertensive urgency  Abnormal LFTs    Rx / DC Orders ED Discharge Orders     None        Pricilla Loveless, MD 10/14/21 [phone]

## 2021-10-14 NOTE — ED Notes (Signed)
Sean Foster, wife, number is [phone].

## 2021-10-14 NOTE — Discharge Instructions (Addendum)
Your blood pressure is quite elevated today, make sure you are taking her meds appropriately and see your doctor for further outpatient work-up and treatment of your blood pressure.  Your liver tests are elevated today.  It has been this way in the past but you should also follow-up with your primary care physician for recheck of this.  Do not drink alcohol or use Tylenol or other medications that could affect the liver until you are cleared of this.  If you start having thoughts of wanting to hurt or kill yourself or hurt or kill anyone else, have racing thoughts, abnormal thoughts that are concerning, or any other new/concerning symptoms then return to the ER for evaluation

## 2021-10-14 NOTE — ED Triage Notes (Addendum)
BIB [employer] from the street. Per [employer] they report pt is SI and no sleep for the past 4 days, ever since he got back from [place]. Had left his car running and was walking in the road. Voluntary.

## 2021-12-13 ENCOUNTER — Encounter (HOSPITAL_COMMUNITY): Payer: Self-pay | Admitting: *Deleted

## 2021-12-13 ENCOUNTER — Emergency Department (HOSPITAL_COMMUNITY): Payer: BLUE CROSS/BLUE SHIELD

## 2021-12-13 ENCOUNTER — Other Ambulatory Visit: Payer: Self-pay

## 2021-12-13 ENCOUNTER — Emergency Department (HOSPITAL_COMMUNITY)
Admission: EM | Admit: 2021-12-13 | Discharge: 2021-12-13 | Disposition: A | Discharge status: Home / Self Care | Payer: BLUE CROSS/BLUE SHIELD | Attending: Emergency Medicine | Admitting: Emergency Medicine

## 2021-12-13 DIAGNOSIS — Z5321 Procedure and treatment not carried out due to patient leaving prior to being seen by health care provider: Secondary | ICD-10-CM | POA: Insufficient documentation

## 2021-12-13 DIAGNOSIS — R519 Headache, unspecified: Secondary | ICD-10-CM | POA: Insufficient documentation

## 2021-12-13 DIAGNOSIS — M79601 Pain in right arm: Secondary | ICD-10-CM | POA: Diagnosis not present

## 2021-12-13 DIAGNOSIS — R0602 Shortness of breath: Secondary | ICD-10-CM | POA: Insufficient documentation

## 2021-12-13 LAB — BASIC METABOLIC PANEL
Anion gap: 6 (ref 5–15)
BUN: 10 mg/dL (ref 6–20)
CO2: 24 mmol/L (ref 22–32)
Calcium: 8.7 mg/dL — ABNORMAL LOW (ref 8.9–10.3)
Chloride: 101 mmol/L (ref 98–111)
Creatinine, Ser: 0.83 mg/dL (ref 0.61–1.24)
GFR, Estimated: >60 mL/min (ref >60)
Glucose, Bld: 308 mg/dL — ABNORMAL HIGH (ref 70–99)
Potassium: 3.6 mmol/L (ref 3.5–5.1)
Sodium: 131 mmol/L — ABNORMAL LOW (ref 135–145)

## 2021-12-13 LAB — CBC
HCT: 44 % (ref 39.0–52.0)
Hemoglobin: 13.3 g/dL (ref 13.0–17.0)
MCH: 21.9 pg — ABNORMAL LOW (ref 26.0–34.0)
MCHC: 30.2 g/dL (ref 30.0–36.0)
MCV: 72.5 fL — ABNORMAL LOW (ref 80.0–100.0)
Platelets: 188 10*3/uL (ref 150–400)
RBC: 6.07 MIL/uL — ABNORMAL HIGH (ref 4.22–5.81)
RDW: 15.4 % (ref 11.5–15.5)
WBC: 4.9 10*3/uL (ref 4.0–10.5)
nRBC: 0 % (ref 0.0–0.2)

## 2021-12-13 LAB — URINALYSIS, MICROSCOPIC (REFLEX)
Bacteria, UA: NONE SEEN
Squamous Epithelial / HPF: NONE SEEN (ref 0–5)

## 2021-12-13 LAB — URINALYSIS, ROUTINE W REFLEX MICROSCOPIC
Bilirubin Urine: NEGATIVE
Glucose, UA: >=500 mg/dL — AB
Ketones, ur: NEGATIVE mg/dL
Leukocytes,Ua: NEGATIVE
Nitrite: NEGATIVE
Protein, ur: 100 mg/dL — AB
Specific Gravity, Urine: 1.02 (ref 1.005–1.030)
pH: 7.5 (ref 5.0–8.0)

## 2021-12-13 LAB — TROPONIN I (HIGH SENSITIVITY): Troponin I (High Sensitivity): 9 ng/L (ref <18)

## 2021-12-13 NOTE — ED Notes (Signed)
Pt seen leaving lobby, LWBS

## 2021-12-13 NOTE — ED Provider Triage Note (Signed)
Emergency Medicine Provider Triage Evaluation Note  Sean Foster , a 46 y.o. male  was evaluated in triage.  Pt complains of headache.  Patient states that he woke up in the middle night with a headache just prior to arrival.  States it is along the frontal and left temporal regions.  It has been constant.  Denies any chest pain or shortness of breath.  No visual changes, numbness, weakness.  Patient also complains of intermittent right arm pain.  States that he wears a wrist brace that improves his symptoms.  No nausea or vomiting.  Physical Exam  BP (!) 193/114    Pulse 88    Temp 98.6 F (37 C) (Oral)    Resp 16    SpO2 100%  Gen:   Awake, no distress   Resp:  Normal effort  MSK:   Moves extremities without difficulty  Other:    Medical Decision Making  Medically screening exam initiated at 1:58 AM.  Appropriate orders placed.  Alford Highland was informed that the remainder of the evaluation will be completed by another provider, this initial triage assessment does not replace that evaluation, and the importance of remaining in the ED until their evaluation is complete.   Placido Sou, PA-C 12/13/21 0159

## 2021-12-13 NOTE — ED Triage Notes (Signed)
Pt says he woke up from his sleep with a headache. Has not taken his bp medications in 2 days. Did not take his diabetes medications today. Denies CP or SOB

## 2021-12-13 NOTE — ED Triage Notes (Signed)
Pt arrives via [facility] from home. Per medic report, headache that awoke him from his sleep "pressure", HTN on scene 200/140 initial. Hx of htn, "taking medications" , but has missed some days here and there. CBG 325, dm but not on medications, hr 80's. Alert and oriented.

## 2021-12-20 ENCOUNTER — Encounter (HOSPITAL_COMMUNITY): Payer: Self-pay

## 2021-12-20 ENCOUNTER — Emergency Department (HOSPITAL_COMMUNITY)
Admission: EM | Admit: 2021-12-20 | Discharge: 2021-12-20 | Disposition: A | Discharge status: Home / Self Care | Payer: BLUE CROSS/BLUE SHIELD | Attending: Emergency Medicine | Admitting: Emergency Medicine

## 2021-12-20 DIAGNOSIS — R519 Headache, unspecified: Secondary | ICD-10-CM | POA: Diagnosis present

## 2021-12-20 DIAGNOSIS — E119 Type 2 diabetes mellitus without complications: Secondary | ICD-10-CM | POA: Insufficient documentation

## 2021-12-20 DIAGNOSIS — Z794 Long term (current) use of insulin: Secondary | ICD-10-CM | POA: Insufficient documentation

## 2021-12-20 DIAGNOSIS — I158 Other secondary hypertension: Secondary | ICD-10-CM

## 2021-12-20 DIAGNOSIS — Z79899 Other long term (current) drug therapy: Secondary | ICD-10-CM | POA: Diagnosis not present

## 2021-12-20 DIAGNOSIS — I10 Essential (primary) hypertension: Secondary | ICD-10-CM | POA: Diagnosis not present

## 2021-12-20 LAB — CBC
HCT: 46.5 % (ref 39.0–52.0)
Hemoglobin: 14.4 g/dL (ref 13.0–17.0)
MCH: 22.5 pg — ABNORMAL LOW (ref 26.0–34.0)
MCHC: 31 g/dL (ref 30.0–36.0)
MCV: 72.8 fL — ABNORMAL LOW (ref 80.0–100.0)
Platelets: 210 10*3/uL (ref 150–400)
RBC: 6.39 MIL/uL — ABNORMAL HIGH (ref 4.22–5.81)
RDW: 15.7 % — ABNORMAL HIGH (ref 11.5–15.5)
WBC: 6.1 10*3/uL (ref 4.0–10.5)
nRBC: 0 % (ref 0.0–0.2)

## 2021-12-20 LAB — BASIC METABOLIC PANEL
Anion gap: 8 (ref 5–15)
BUN: 19 mg/dL (ref 6–20)
CO2: 27 mmol/L (ref 22–32)
Calcium: 9.3 mg/dL (ref 8.9–10.3)
Chloride: 100 mmol/L (ref 98–111)
Creatinine, Ser: 0.86 mg/dL (ref 0.61–1.24)
GFR, Estimated: >60 mL/min (ref >60)
Glucose, Bld: 326 mg/dL — ABNORMAL HIGH (ref 70–99)
Potassium: 3.7 mmol/L (ref 3.5–5.1)
Sodium: 135 mmol/L (ref 135–145)

## 2021-12-20 MED ORDER — PROCHLORPERAZINE EDISYLATE 10 MG/2ML IJ SOLN
10.0000 mg | Freq: Once | INTRAMUSCULAR | Status: AC
Start: 2021-12-20 — End: 2021-12-20
  Administered 2021-12-20: 10 mg via INTRAVENOUS
  Filled 2021-12-20: qty 2

## 2021-12-20 MED ORDER — ACETAMINOPHEN 500 MG PO TABS
1000.0000 mg | ORAL_TABLET | Freq: Once | ORAL | Status: AC
Start: 2021-12-20 — End: 2021-12-20
  Administered 2021-12-20: 1000 mg via ORAL
  Filled 2021-12-20: qty 2

## 2021-12-20 MED ORDER — LABETALOL HCL 5 MG/ML IV SOLN
10.0000 mg | Freq: Once | INTRAVENOUS | Status: AC
  Administered 2021-12-20: 10 mg via INTRAVENOUS
  Filled 2021-12-20: qty 4

## 2021-12-20 MED ORDER — AMLODIPINE BESYLATE 5 MG PO TABS
10.0000 mg | ORAL_TABLET | Freq: Once | ORAL | Status: AC
  Administered 2021-12-20: 10 mg via ORAL
  Filled 2021-12-20: qty 2

## 2021-12-20 NOTE — ED Triage Notes (Signed)
Patient reports from home with complaint of headache that began x 5 days ago. Pt states he has been unable to sleep due to pain, denies taking any medication for symptoms. Hx HTN, did not take BP meds today.

## 2021-12-20 NOTE — ED Provider Notes (Signed)
[facility]-EMERGENCY DEPT Provider Note  CSN: [id number] Arrival date & time: 12/20/21 [id number]  Chief Complaint(s) Headache and Hypertension  HPI Sean Foster is a 46 y.o. male    Headache Pain location:  L temporal Quality:  Dull Duration:  5 days Timing:  Constant Progression:  Worsening Associated symptoms: no blurred vision, no cough, no fever, no focal weakness, no loss of balance, no nausea and no vomiting   Hypertension Associated symptoms include headaches.   Past Medical History Past Medical History:  Diagnosis Date   Diabetes mellitus without complication (HCC)    Hypertension    Patient Active Problem List   Diagnosis Date Noted   Left buttock abscess 04/16/2020   Diabetes (HCC) 04/16/2020   Cellulitis of left buttock 04/16/2020   Home Medication(s) Prior to Admission medications   Medication Sig Start Date End Date Taking? Authorizing Provider  acetaminophen (TYLENOL) 500 MG tablet Take 1,000 mg by mouth every 6 (six) hours as needed for mild pain or headache.    [provider]  cyclobenzaprine (FLEXERIL) 10 MG tablet Take 1 tablet (10 mg total) by mouth 2 (two) times daily as needed for muscle spasms. 03/21/20   Linwood DibblesKnapp, Jon, MD  doxycycline (VIBRAMYCIN) 100 MG capsule Take 1 capsule (100 mg total) by mouth 2 (two) times daily. 11/15/20   Henderly, Britni A, PA-C  hydrochlorothiazide (HYDRODIURIL) 25 MG tablet Take 1 tablet (25 mg total) by mouth daily. 06/16/19   Renne CriglerGeiple, Joshua, PA-C  HYDROcodone-acetaminophen (NORCO/VICODIN) 5-325 MG tablet Take 1 tablet by mouth every 4 (four) hours as needed. 11/15/20   Henderly, Britni A, PA-C  insulin glargine (LANTUS) 100 UNIT/ML Solostar Pen Inject 15 Units into the skin at bedtime. 04/17/20 05/17/20  Barnetta Chapelsborne, Kelly, PA-C  Insulin Pen Needle (PEN NEEDLES 3/16") 31G X 5 MM MISC 1 each by Does not apply route daily. 04/17/20   Barnetta Chapelsborne, Kelly, PA-C  metFORMIN (GLUCOPHAGE) 500 MG tablet Take 1 tablet  (500 mg total) by mouth 2 (two) times daily with a meal. 04/17/20 05/17/20  Barnetta Chapelsborne, Kelly, PA-C  methocarbamol (ROBAXIN) 500 MG tablet Take 1 tablet (500 mg total) by mouth 2 (two) times daily. 05/16/21   Mare FerrariBelaya, Maria A, PA-C  oxyCODONE (OXY IR/ROXICODONE) 5 MG immediate release tablet Take 1-2 tablets (5-10 mg total) by mouth every 6 (six) hours as needed for moderate pain. 04/17/20   Barnetta Chapelsborne, Kelly, PA-C  amLODipine (NORVASC) 5 MG tablet Take 1 tablet (5 mg total) by mouth daily. Patient not taking: Reported on 06/16/2019 02/24/18 06/16/19  Raeford RazorKohut, Stephen, MD                                                                                                                                    Allergies Chloroquine, Chloroquine, Chlorothen, and Chlorothen  Review of Systems Review of Systems  Constitutional:  Negative for fever.  Eyes:  Negative for blurred vision.  Respiratory:  Negative  for cough.   Gastrointestinal:  Negative for nausea and vomiting.  Neurological:  Positive for headaches. Negative for focal weakness and loss of balance.  As noted in HPI  Physical Exam Vital Signs  I have reviewed the triage vital signs BP (!) 187/107    Pulse 85    Temp 97.6 F (36.4 C) (Oral)    Resp 12    SpO2 97%   Physical Exam Vitals reviewed.  Constitutional:      General: He is not in acute distress.    Appearance: He is well-developed. He is not diaphoretic.  HENT:     Head: Normocephalic and atraumatic.     Right Ear: External ear normal.     Left Ear: External ear normal.     Nose: Nose normal.     Mouth/Throat:     Mouth: Mucous membranes are moist.  Eyes:     General: No scleral icterus.    Conjunctiva/sclera: Conjunctivae normal.  Neck:     Trachea: Phonation normal.  Cardiovascular:     Rate and Rhythm: Normal rate and regular rhythm.  Pulmonary:     Effort: Pulmonary effort is normal. No respiratory distress.     Breath sounds: No stridor.  Abdominal:     General: There is no  distension.  Musculoskeletal:        General: Normal range of motion.     Cervical back: Normal range of motion.  Neurological:     Mental Status: He is alert and oriented to person, place, and time.     Cranial Nerves: Cranial nerves 2-12 are intact.     Sensory: Sensation is intact.     Motor: Motor function is intact.  Psychiatric:        Behavior: Behavior normal.    ED Results and Treatments Labs (all labs ordered are listed, but only abnormal results are displayed) Labs Reviewed  BASIC METABOLIC PANEL - Abnormal; Notable for the following components:      Result Value   Glucose, Bld 326 (*)    All other components within normal limits  CBC - Abnormal; Notable for the following components:   RBC 6.39 (*)    MCV 72.8 (*)    MCH 22.5 (*)    RDW 15.7 (*)    All other components within normal limits                                                                                                                         EKG  EKG Interpretation  Date/Time:    Ventricular Rate:    PR Interval:    QRS Duration:   QT Interval:    QTC Calculation:   R Axis:     Text Interpretation:         Radiology No results found.  Pertinent labs & imaging results that were available during my care of the patient were reviewed by me and considered in my medical decision making (see MDM  for details).  Medications Ordered in ED Medications  acetaminophen (TYLENOL) tablet 1,000 mg (1,000 mg Oral Given 12/20/21 0402)  labetalol (NORMODYNE) injection 10 mg (10 mg Intravenous Given 12/20/21 0404)  amLODipine (NORVASC) tablet 10 mg (10 mg Oral Given 12/20/21 0402)  prochlorperazine (COMPAZINE) injection 10 mg (10 mg Intravenous Given 12/20/21 0404)                                                                                                                                     Procedures Procedures  (including critical care time)  Medical Decision Making / ED Course         headache Gradual onset. In setting of uncontrolled HTN Even though patient has significant hypertension I have low suspicion for ICH at this time. Do not think CT scan is needed at this time On review of records, note from patient's PCP mentioned history of noncompliance with medication. Patient is not febrile.  Doubt meningitis Will assess for evidence of end-organ damage.  Work-up ordered to assess concerns above. Labs Independently interpreted by me and noted below: No evidence of renal sufficiency.  Management: Patient provided with IV Compazine, IV labetalol, oral Tylenol, and oral dose of his Norvasc.  Reassessment: Pain completely resolved. Blood pressure trending down to 180s. Reaffirmed the need for compliance with his medication    Final Clinical Impression(s) / ED Diagnoses Final diagnoses:  Other secondary hypertension  Bad headache   The patient appears reasonably screened and/or stabilized for discharge and I doubt any other medical condition or other [facility] requiring further screening, evaluation, or treatment in the ED at this time prior to discharge. Safe for discharge with strict return precautions.  Disposition: Discharge  Condition: Good  I have discussed the results, Dx and Tx plan with the patient/family who expressed understanding and agree(s) with the plan. Discharge instructions discussed at length. The patient/family was given strict return precautions who verbalized understanding of the instructions. No further questions at time of discharge.    ED Discharge Orders     None         Follow Up: Clemencia Course, [place] [address] [place] [address] [phone]  Call  to schedule an appointment for close follow up           This chart was dictated using voice recognition software.  Despite best efforts to proofread,  errors can occur which can change the documentation meaning.    Nira Conn,  MD 12/20/21 [phone]

## 2022-07-18 ENCOUNTER — Encounter (HOSPITAL_COMMUNITY): Payer: Self-pay | Admitting: Emergency Medicine

## 2022-07-18 ENCOUNTER — Emergency Department (HOSPITAL_COMMUNITY)
Admission: EM | Admit: 2022-07-18 | Discharge: 2022-07-18 | Disposition: A | Discharge status: Home / Self Care | Payer: Self-pay | Attending: Emergency Medicine | Admitting: Emergency Medicine

## 2022-07-18 ENCOUNTER — Other Ambulatory Visit: Payer: Self-pay

## 2022-07-18 ENCOUNTER — Emergency Department (HOSPITAL_COMMUNITY): Payer: Self-pay

## 2022-07-18 DIAGNOSIS — I509 Heart failure, unspecified: Secondary | ICD-10-CM | POA: Insufficient documentation

## 2022-07-18 DIAGNOSIS — E119 Type 2 diabetes mellitus without complications: Secondary | ICD-10-CM | POA: Insufficient documentation

## 2022-07-18 DIAGNOSIS — I110 Hypertensive heart disease with heart failure: Secondary | ICD-10-CM | POA: Insufficient documentation

## 2022-07-18 DIAGNOSIS — Z7984 Long term (current) use of oral hypoglycemic drugs: Secondary | ICD-10-CM | POA: Insufficient documentation

## 2022-07-18 DIAGNOSIS — J189 Pneumonia, unspecified organism: Secondary | ICD-10-CM | POA: Insufficient documentation

## 2022-07-18 DIAGNOSIS — Z79899 Other long term (current) drug therapy: Secondary | ICD-10-CM | POA: Insufficient documentation

## 2022-07-18 DIAGNOSIS — Z20822 Contact with and (suspected) exposure to covid-19: Secondary | ICD-10-CM | POA: Insufficient documentation

## 2022-07-18 DIAGNOSIS — Z794 Long term (current) use of insulin: Secondary | ICD-10-CM | POA: Insufficient documentation

## 2022-07-18 LAB — RESP PANEL BY RT-PCR (FLU A&B, COVID) ARPGX2
Influenza A by PCR: NEGATIVE
Influenza B by PCR: NEGATIVE
SARS Coronavirus 2 by RT PCR: NEGATIVE

## 2022-07-18 LAB — CBC WITH DIFFERENTIAL/PLATELET
Abs Immature Granulocytes: 0.02 10*3/uL (ref 0.00–0.07)
Basophils Absolute: 0 10*3/uL (ref 0.0–0.1)
Basophils Relative: 0 %
Eosinophils Absolute: 0 10*3/uL (ref 0.0–0.5)
Eosinophils Relative: 1 %
HCT: 40.8 % (ref 39.0–52.0)
Hemoglobin: 12.7 g/dL — ABNORMAL LOW (ref 13.0–17.0)
Immature Granulocytes: 0 %
Lymphocytes Relative: 35 %
Lymphs Abs: 1.8 10*3/uL (ref 0.7–4.0)
MCH: 23.1 pg — ABNORMAL LOW (ref 26.0–34.0)
MCHC: 31.1 g/dL (ref 30.0–36.0)
MCV: 74.3 fL — ABNORMAL LOW (ref 80.0–100.0)
Monocytes Absolute: 0.5 10*3/uL (ref 0.1–1.0)
Monocytes Relative: 9 %
Neutro Abs: 2.9 10*3/uL (ref 1.7–7.7)
Neutrophils Relative %: 55 %
Platelets: 244 10*3/uL (ref 150–400)
RBC: 5.49 MIL/uL (ref 4.22–5.81)
RDW: 14.3 % (ref 11.5–15.5)
WBC: 5.3 10*3/uL (ref 4.0–10.5)
nRBC: 0 % (ref 0.0–0.2)

## 2022-07-18 LAB — COMPREHENSIVE METABOLIC PANEL
ALT: 7 U/L (ref 0–44)
AST: 25 U/L (ref 15–41)
Albumin: 3.8 g/dL (ref 3.5–5.0)
Alkaline Phosphatase: 207 U/L — ABNORMAL HIGH (ref 38–126)
Anion gap: 7 (ref 5–15)
BUN: 10 mg/dL (ref 6–20)
CO2: 26 mmol/L (ref 22–32)
Calcium: 9.1 mg/dL (ref 8.9–10.3)
Chloride: 105 mmol/L (ref 98–111)
Creatinine, Ser: 0.85 mg/dL (ref 0.61–1.24)
GFR, Estimated: >60 mL/min (ref >60)
Glucose, Bld: 251 mg/dL — ABNORMAL HIGH (ref 70–99)
Potassium: 3.8 mmol/L (ref 3.5–5.1)
Sodium: 138 mmol/L (ref 135–145)
Total Bilirubin: 0.8 mg/dL (ref 0.3–1.2)
Total Protein: 7.6 g/dL (ref 6.5–8.1)

## 2022-07-18 LAB — TROPONIN I (HIGH SENSITIVITY)
Troponin I (High Sensitivity): 15 ng/L (ref <18)
Troponin I (High Sensitivity): 12 ng/L (ref <18)

## 2022-07-18 LAB — BRAIN NATRIURETIC PEPTIDE: B Natriuretic Peptide: 350.4 pg/mL — ABNORMAL HIGH (ref 0.0–100.0)

## 2022-07-18 MED ORDER — ALBUTEROL SULFATE HFA 108 (90 BASE) MCG/ACT IN AERS: 2.0000 | INHALATION_SPRAY | RESPIRATORY_TRACT | Status: DC | PRN

## 2022-07-18 MED ORDER — DOXYCYCLINE HYCLATE 100 MG PO CAPS: 100.0000 mg | ORAL_CAPSULE | Freq: Two times a day (BID) | ORAL | 0 refills | Status: DC

## 2022-07-18 MED ORDER — LABETALOL HCL 5 MG/ML IV SOLN
10.0000 mg | Freq: Once | INTRAVENOUS | Status: AC
  Administered 2022-07-18: 10 mg via INTRAVENOUS
  Filled 2022-07-18: qty 4

## 2022-07-18 MED ORDER — FUROSEMIDE 10 MG/ML IJ SOLN
40.0000 mg | Freq: Once | INTRAMUSCULAR | Status: AC
  Administered 2022-07-18: 40 mg via INTRAVENOUS
  Filled 2022-07-18: qty 4

## 2022-07-18 MED ORDER — FUROSEMIDE 20 MG PO TABS: 20.0000 mg | ORAL_TABLET | Freq: Every day | ORAL | 0 refills | Status: DC

## 2022-07-18 NOTE — ED Triage Notes (Signed)
Pt reports SHOB and cough X 3 weeks. Pt reports him and his wife have just found out they have mold in their house.

## 2022-07-18 NOTE — Discharge Instructions (Signed)
It looks like you may have a new heart failure.  Your blood pressure is also very elevated.  Call your doctor tomorrow about adjusting medicines.  You are leaving against our advice but we will be treating for that and a pneumonia since the x-ray showed a possible infiltrate

## 2022-07-18 NOTE — ED Provider Notes (Signed)
[facility]-EMERGENCY DEPT Provider Note   CSN: [id number] Arrival date & time: 07/18/22  2778     History  Chief Complaint  Patient presents with   Shortness of Breath    Sean Foster is a 46 y.o. male.   Shortness of Breath Patient presents with shortness of breath and cough.  Has had for 3 weeks.  States cough with minimal sputum production.  States his wife is here with similar symptoms and they have newly discovered mold in the house.  Has not been a house in 3 days.  States he has not been able to sleep for about 4 days due to the amount of coughing.  Does have some chest tightness.  History of hypertension.  States compliance with his medication but upon arrival blood pressure is 208/120.  No swelling in his legs.  No fevers.    Past Medical History:  Diagnosis Date   Diabetes mellitus without complication (HCC)    Hypertension     Home Medications Prior to Admission medications   Medication Sig Start Date End Date Taking? Authorizing Provider  furosemide (LASIX) 20 MG tablet Take 1 tablet (20 mg total) by mouth daily. 07/18/22  Yes Benjiman Core, MD  acetaminophen (TYLENOL) 500 MG tablet Take 1,000 mg by mouth every 6 (six) hours as needed for mild pain or headache.    [provider]  cyclobenzaprine (FLEXERIL) 10 MG tablet Take 1 tablet (10 mg total) by mouth 2 (two) times daily as needed for muscle spasms. 03/21/20   Linwood Dibbles, MD  doxycycline (VIBRAMYCIN) 100 MG capsule Take 1 capsule (100 mg total) by mouth 2 (two) times daily. 07/18/22   Benjiman Core, MD  hydrochlorothiazide (HYDRODIURIL) 25 MG tablet Take 1 tablet (25 mg total) by mouth daily. 06/16/19   Renne Crigler, PA-C  HYDROcodone-acetaminophen (NORCO/VICODIN) 5-325 MG tablet Take 1 tablet by mouth every 4 (four) hours as needed. 11/15/20   Henderly, Britni A, PA-C  insulin glargine (LANTUS) 100 UNIT/ML Solostar Pen Inject 15 Units into the skin at bedtime. 04/17/20 07/18/22   Barnetta Chapel, PA-C  Insulin Pen Needle (PEN NEEDLES 3/16") 31G X 5 MM MISC 1 each by Does not apply route daily. 04/17/20   Barnetta Chapel, PA-C  metFORMIN (GLUCOPHAGE) 500 MG tablet Take 1 tablet (500 mg total) by mouth 2 (two) times daily with a meal. 04/17/20 07/18/22  Barnetta Chapel, PA-C  methocarbamol (ROBAXIN) 500 MG tablet Take 1 tablet (500 mg total) by mouth 2 (two) times daily. 05/16/21   Mare Ferrari, PA-C  oxyCODONE (OXY IR/ROXICODONE) 5 MG immediate release tablet Take 1-2 tablets (5-10 mg total) by mouth every 6 (six) hours as needed for moderate pain. 04/17/20   Barnetta Chapel, PA-C  amLODipine (NORVASC) 5 MG tablet Take 1 tablet (5 mg total) by mouth daily. Patient not taking: Reported on 06/16/2019 02/24/18 06/16/19  Raeford Razor, MD      Allergies    Chloroquine and Chlorothen    Review of Systems   Review of Systems  Respiratory:  Positive for shortness of breath.     Physical Exam Updated Vital Signs BP (!) 178/109   Pulse 84   Temp 99 F (37.2 C) (Oral)   Resp 20   SpO2 97%  Physical Exam Vitals and nursing note reviewed.  Pulmonary:     Breath sounds: No wheezing or rhonchi.  Chest:     Chest wall: No tenderness.  Musculoskeletal:     Cervical back: Normal range  of motion.     Comments: Trace edema bilateral lower extremities.  Skin:    Capillary Refill: Capillary refill takes less than 2 seconds.  Neurological:     Mental Status: He is alert and oriented to person, place, and time.     ED Results / Procedures / Treatments   Labs (all labs ordered are listed, but only abnormal results are displayed) Labs Reviewed  BRAIN NATRIURETIC PEPTIDE - Abnormal; Notable for the following components:      Result Value   B Natriuretic Peptide 350.4 (*)    All other components within normal limits  COMPREHENSIVE METABOLIC PANEL - Abnormal; Notable for the following components:   Glucose, Bld 251 (*)    Alkaline Phosphatase 207 (*)    All other components  within normal limits  CBC WITH DIFFERENTIAL/PLATELET - Abnormal; Notable for the following components:   Hemoglobin 12.7 (*)    MCV 74.3 (*)    MCH 23.1 (*)    All other components within normal limits  RESP PANEL BY RT-PCR (FLU A&B, COVID) ARPGX2  TROPONIN I (HIGH SENSITIVITY)  TROPONIN I (HIGH SENSITIVITY)    EKG EKG Interpretation  Date/Time:  Thursday July 18 2022 09:32:49 EDT Ventricular Rate:  90 PR Interval:  134 QRS Duration: 88 QT Interval:  365 QTC Calculation: 447 R Axis:   37 Text Interpretation: Sinus rhythm Probable left atrial enlargement Anterior infarct, old No significant change since last tracing Confirmed by Benjiman Core [phone]) on 07/18/2022 10:01:53 AM  Radiology DG Chest 2 View  Result Date: 07/18/2022 CLINICAL DATA:  46 year old male with shortness of breath and cough for 3 weeks. Exposure to mold in house. EXAM: CHEST - 2 VIEW COMPARISON:  Chest radiographs 12/13/2021 and earlier. FINDINGS: Evidence of small layering left pleural effusion with some fluid tracking into the bilateral pleural fissures. Lower lung volumes. Cardiac size is stable at the upper limits of normal. Some diffuse pulmonary vascular congestion. Patchy additional left lung base opacity. No confluent right lung opacity. Visualized tracheal air column is within normal limits. No acute osseous abnormality identified. Negative visible bowel gas. IMPRESSION: Abnormal increased interstitial markings, small left pleural effusion, and patchy left lung base opacity. Differential considerations include pulmonary edema with left pleural effusion and atelectasis, versus respiratory infection with left lung base pneumonia and small effusion. Electronically Signed   By: Odessa Fleming M.D.   On: 07/18/2022 10:16    Procedures Procedures    Medications Ordered in ED Medications  albuterol (VENTOLIN HFA) 108 (90 Base) MCG/ACT inhaler 2 puff (has no administration in time range)  labetalol (NORMODYNE)  injection 10 mg (10 mg Intravenous Given 07/18/22 1417)  furosemide (LASIX) injection 40 mg (40 mg Intravenous Given 07/18/22 1417)    ED Course/ Medical Decision Making/ A&P                           Medical Decision Making Amount and/or Complexity of Data Reviewed Labs: ordered. Radiology: ordered.  Risk Prescription drug management.   Patient presents with shortness of breath and cough.  Is had for the last 3 weeks.  Reported mold exposure.  Wife is here with similar symptoms.  Has had cough.  However patient also has a history hypertension.  Per PCP history of noncompliance.  However patient is hypertensive blood pressure of 208/120 here.  Has had some chest pain.  Will get x-ray to evaluate for pneumonia.  We will also get troponin and some  blood work.  I have reviewed PCP note Normal but BNP is elevated.  EKG stable.  Continues to have hypertension.  Has been given some labetalol for blood pressure turned out.  Chest x-ray done showed CHF and potential pneumonia.  With potential new CHF I believe patient benefit from mission to the hospital.  Does not appear to have had an echocardiogram done previously.  However patient not willing to stay overnight.  Has been given some IV Lasix here and had good amount of urine return.  Also will give short course of Lasix for home and will need short-term follow-up for blood pressure control.  States he will call his PCP tomorrow.  Also will give antibiotics since infiltrate and cough with worsening shortness of breath.  Not hypoxic however.        Final Clinical Impression(s) / ED Diagnoses Final diagnoses:  Acute congestive heart failure, unspecified heart failure type (HCC)  Community acquired pneumonia, unspecified laterality    Rx / DC Orders ED Discharge Orders          Ordered    doxycycline (VIBRAMYCIN) 100 MG capsule  2 times daily        07/18/22 1612    furosemide (LASIX) 20 MG tablet  Daily        07/18/22 1613               Benjiman Core, MD 07/18/22 1649

## 2022-12-02 DIAGNOSIS — Z760 Encounter for issue of repeat prescription: Secondary | ICD-10-CM | POA: Diagnosis not present

## 2022-12-02 DIAGNOSIS — R059 Cough, unspecified: Secondary | ICD-10-CM | POA: Diagnosis not present

## 2022-12-02 DIAGNOSIS — Z20822 Contact with and (suspected) exposure to covid-19: Secondary | ICD-10-CM | POA: Diagnosis not present

## 2022-12-02 DIAGNOSIS — R0602 Shortness of breath: Secondary | ICD-10-CM | POA: Diagnosis not present

## 2022-12-02 DIAGNOSIS — R079 Chest pain, unspecified: Secondary | ICD-10-CM | POA: Diagnosis not present

## 2022-12-12 ENCOUNTER — Encounter (HOSPITAL_COMMUNITY): Payer: Self-pay

## 2022-12-12 ENCOUNTER — Emergency Department (HOSPITAL_COMMUNITY)
Admission: EM | Admit: 2022-12-12 | Discharge: 2022-12-12 | Discharge status: Against Medical Advice | Payer: 59 | Attending: Physician Assistant | Admitting: Physician Assistant

## 2022-12-12 DIAGNOSIS — Z5321 Procedure and treatment not carried out due to patient leaving prior to being seen by health care provider: Secondary | ICD-10-CM | POA: Insufficient documentation

## 2022-12-12 DIAGNOSIS — R109 Unspecified abdominal pain: Secondary | ICD-10-CM | POA: Insufficient documentation

## 2022-12-12 DIAGNOSIS — R112 Nausea with vomiting, unspecified: Secondary | ICD-10-CM | POA: Insufficient documentation

## 2022-12-12 DIAGNOSIS — R1013 Epigastric pain: Secondary | ICD-10-CM | POA: Diagnosis not present

## 2022-12-12 LAB — COMPREHENSIVE METABOLIC PANEL
ALT: 29 U/L (ref 0–44)
AST: 218 U/L — ABNORMAL HIGH (ref 15–41)
Albumin: 3.7 g/dL (ref 3.5–5.0)
Alkaline Phosphatase: 174 U/L — ABNORMAL HIGH (ref 38–126)
Anion gap: 9 (ref 5–15)
BUN: 14 mg/dL (ref 6–20)
CO2: 25 mmol/L (ref 22–32)
Calcium: 9.1 mg/dL (ref 8.9–10.3)
Chloride: 100 mmol/L (ref 98–111)
Creatinine, Ser: 1.11 mg/dL (ref 0.61–1.24)
GFR, Estimated: >60 mL/min (ref >60)
Glucose, Bld: 140 mg/dL — ABNORMAL HIGH (ref 70–99)
Potassium: 3.1 mmol/L — ABNORMAL LOW (ref 3.5–5.1)
Sodium: 134 mmol/L — ABNORMAL LOW (ref 135–145)
Total Bilirubin: 2.5 mg/dL — ABNORMAL HIGH (ref 0.3–1.2)
Total Protein: 7.8 g/dL (ref 6.5–8.1)

## 2022-12-12 LAB — CBC WITH DIFFERENTIAL/PLATELET
Abs Immature Granulocytes: 0.02 10*3/uL (ref 0.00–0.07)
Basophils Absolute: 0 10*3/uL (ref 0.0–0.1)
Basophils Relative: 0 %
Eosinophils Absolute: 0.1 10*3/uL (ref 0.0–0.5)
Eosinophils Relative: 1 %
HCT: 40.2 % (ref 39.0–52.0)
Hemoglobin: 12.3 g/dL — ABNORMAL LOW (ref 13.0–17.0)
Immature Granulocytes: 0 %
Lymphocytes Relative: 31 %
Lymphs Abs: 1.5 10*3/uL (ref 0.7–4.0)
MCH: 22.3 pg — ABNORMAL LOW (ref 26.0–34.0)
MCHC: 30.6 g/dL (ref 30.0–36.0)
MCV: 72.8 fL — ABNORMAL LOW (ref 80.0–100.0)
Monocytes Absolute: 0.7 10*3/uL (ref 0.1–1.0)
Monocytes Relative: 15 %
Neutro Abs: 2.6 10*3/uL (ref 1.7–7.7)
Neutrophils Relative %: 53 %
Platelets: 224 10*3/uL (ref 150–400)
RBC: 5.52 MIL/uL (ref 4.22–5.81)
RDW: 16.8 % — ABNORMAL HIGH (ref 11.5–15.5)
WBC: 4.9 10*3/uL (ref 4.0–10.5)
nRBC: 0 % (ref 0.0–0.2)

## 2022-12-12 LAB — URINALYSIS, ROUTINE W REFLEX MICROSCOPIC
Bacteria, UA: NONE SEEN
Bilirubin Urine: NEGATIVE
Glucose, UA: NEGATIVE mg/dL
Ketones, ur: NEGATIVE mg/dL
Leukocytes,Ua: NEGATIVE
Nitrite: NEGATIVE
Protein, ur: >=300 mg/dL — AB
Specific Gravity, Urine: 1.02 (ref 1.005–1.030)
pH: 5 (ref 5.0–8.0)

## 2022-12-12 LAB — LIPASE, BLOOD: Lipase: 138 U/L — ABNORMAL HIGH (ref 11–51)

## 2022-12-12 NOTE — ED Triage Notes (Signed)
Pt c/o RLQ abdominal pain and emesis for three days. Pt states he was recently seen and given abx for boils to his head. Pt states that since then his urine has been dark and having blood in it.

## 2022-12-12 NOTE — ED Provider Triage Note (Signed)
Emergency Medicine Provider Triage Evaluation Note  Sean Foster , a 47 y.o. male  was evaluated in triage.  Pt complains of abdominal pain, N/V for several days, not keeping his meds down.  Review of Systems  Positive: N/V Negative: fever  Physical Exam  BP (!) 203/116 (BP Location: Left Arm)   Pulse 83   Temp 98.1 F (36.7 C) (Oral)   Resp 18   SpO2 100%  Gen:   Awake, no distress   Resp:  Normal effort  MSK:   Moves extremities without difficulty  Other:  Abdomen is soft with mild epigastric tenderness  Medical Decision Making  Medically screening exam initiated at 6:01 PM.  Appropriate orders placed.  [name] was informed that the remainder of the evaluation will be completed by another provider, this initial triage assessment does not replace that evaluation, and the importance of remaining in the ED until their evaluation is complete.     Gwenevere Abbot, [place] 12/12/22 1814

## 2022-12-13 DIAGNOSIS — R1031 Right lower quadrant pain: Secondary | ICD-10-CM | POA: Diagnosis not present

## 2022-12-29 DIAGNOSIS — K719 Toxic liver disease, unspecified: Secondary | ICD-10-CM | POA: Diagnosis not present

## 2022-12-29 DIAGNOSIS — R7989 Other specified abnormal findings of blood chemistry: Secondary | ICD-10-CM | POA: Diagnosis not present

## 2022-12-29 DIAGNOSIS — E8809 Other disorders of plasma-protein metabolism, not elsewhere classified: Secondary | ICD-10-CM | POA: Diagnosis not present

## 2022-12-29 DIAGNOSIS — R69 Illness, unspecified: Secondary | ICD-10-CM | POA: Diagnosis not present

## 2022-12-29 DIAGNOSIS — F17210 Nicotine dependence, cigarettes, uncomplicated: Secondary | ICD-10-CM | POA: Diagnosis not present

## 2022-12-29 DIAGNOSIS — Z789 Other specified health status: Secondary | ICD-10-CM | POA: Diagnosis not present

## 2022-12-29 DIAGNOSIS — I10 Essential (primary) hypertension: Secondary | ICD-10-CM | POA: Diagnosis not present

## 2022-12-29 DIAGNOSIS — K766 Portal hypertension: Secondary | ICD-10-CM | POA: Diagnosis not present

## 2022-12-29 DIAGNOSIS — K7469 Other cirrhosis of liver: Secondary | ICD-10-CM | POA: Diagnosis not present

## 2022-12-29 DIAGNOSIS — K7460 Unspecified cirrhosis of liver: Secondary | ICD-10-CM | POA: Diagnosis not present

## 2022-12-29 DIAGNOSIS — R933 Abnormal findings on diagnostic imaging of other parts of digestive tract: Secondary | ICD-10-CM | POA: Diagnosis not present

## 2022-12-29 DIAGNOSIS — E119 Type 2 diabetes mellitus without complications: Secondary | ICD-10-CM | POA: Diagnosis not present

## 2022-12-29 DIAGNOSIS — Z7901 Long term (current) use of anticoagulants: Secondary | ICD-10-CM | POA: Diagnosis not present

## 2022-12-29 DIAGNOSIS — I81 Portal vein thrombosis: Secondary | ICD-10-CM | POA: Diagnosis not present

## 2022-12-29 DIAGNOSIS — I5022 Chronic systolic (congestive) heart failure: Secondary | ICD-10-CM | POA: Diagnosis not present

## 2022-12-29 DIAGNOSIS — E876 Hypokalemia: Secondary | ICD-10-CM | POA: Diagnosis not present

## 2022-12-29 DIAGNOSIS — R1084 Generalized abdominal pain: Secondary | ICD-10-CM | POA: Diagnosis not present

## 2022-12-29 DIAGNOSIS — T361X5A Adverse effect of cephalosporins and other beta-lactam antibiotics, initial encounter: Secondary | ICD-10-CM | POA: Diagnosis not present

## 2022-12-29 DIAGNOSIS — Z794 Long term (current) use of insulin: Secondary | ICD-10-CM | POA: Diagnosis not present

## 2022-12-29 DIAGNOSIS — Z20822 Contact with and (suspected) exposure to covid-19: Secondary | ICD-10-CM | POA: Diagnosis not present

## 2022-12-29 DIAGNOSIS — I110 Hypertensive heart disease with heart failure: Secondary | ICD-10-CM | POA: Diagnosis not present

## 2022-12-29 DIAGNOSIS — K6389 Other specified diseases of intestine: Secondary | ICD-10-CM | POA: Diagnosis not present

## 2022-12-31 DIAGNOSIS — K766 Portal hypertension: Secondary | ICD-10-CM | POA: Diagnosis not present

## 2022-12-31 DIAGNOSIS — I10 Essential (primary) hypertension: Secondary | ICD-10-CM | POA: Diagnosis not present

## 2022-12-31 DIAGNOSIS — K7460 Unspecified cirrhosis of liver: Secondary | ICD-10-CM | POA: Diagnosis not present

## 2023-02-20 ENCOUNTER — Encounter (HOSPITAL_COMMUNITY): Payer: Self-pay

## 2023-02-20 ENCOUNTER — Emergency Department (HOSPITAL_COMMUNITY)
Admission: EM | Admit: 2023-02-20 | Discharge: 2023-02-20 | Disposition: A | Discharge status: Home / Self Care | Payer: 59 | Attending: Emergency Medicine | Admitting: Emergency Medicine

## 2023-02-20 ENCOUNTER — Other Ambulatory Visit: Payer: Self-pay

## 2023-02-20 DIAGNOSIS — Z7984 Long term (current) use of oral hypoglycemic drugs: Secondary | ICD-10-CM | POA: Diagnosis not present

## 2023-02-20 DIAGNOSIS — R9431 Abnormal electrocardiogram [ECG] [EKG]: Secondary | ICD-10-CM | POA: Diagnosis not present

## 2023-02-20 DIAGNOSIS — Z6835 Body mass index (BMI) 35.0-35.9, adult: Secondary | ICD-10-CM | POA: Diagnosis not present

## 2023-02-20 DIAGNOSIS — E119 Type 2 diabetes mellitus without complications: Secondary | ICD-10-CM | POA: Diagnosis not present

## 2023-02-20 DIAGNOSIS — I160 Hypertensive urgency: Secondary | ICD-10-CM | POA: Diagnosis not present

## 2023-02-20 DIAGNOSIS — Z794 Long term (current) use of insulin: Secondary | ICD-10-CM | POA: Diagnosis not present

## 2023-02-20 DIAGNOSIS — Z79899 Other long term (current) drug therapy: Secondary | ICD-10-CM | POA: Diagnosis not present

## 2023-02-20 DIAGNOSIS — Z91199 Patient's noncompliance with other medical treatment and regimen due to unspecified reason: Secondary | ICD-10-CM | POA: Diagnosis not present

## 2023-02-20 DIAGNOSIS — I10 Essential (primary) hypertension: Secondary | ICD-10-CM | POA: Insufficient documentation

## 2023-02-20 LAB — CBC
HCT: 38.5 % — ABNORMAL LOW (ref 39.0–52.0)
Hemoglobin: 11.8 g/dL — ABNORMAL LOW (ref 13.0–17.0)
MCH: 23.2 pg — ABNORMAL LOW (ref 26.0–34.0)
MCHC: 30.6 g/dL (ref 30.0–36.0)
MCV: 75.6 fL — ABNORMAL LOW (ref 80.0–100.0)
Platelets: 231 10*3/uL (ref 150–400)
RBC: 5.09 MIL/uL (ref 4.22–5.81)
RDW: 15.3 % (ref 11.5–15.5)
WBC: 4.9 10*3/uL (ref 4.0–10.5)
nRBC: 0 % (ref 0.0–0.2)

## 2023-02-20 LAB — COMPREHENSIVE METABOLIC PANEL
ALT: 7 U/L (ref 0–44)
AST: 28 U/L (ref 15–41)
Albumin: 3 g/dL — ABNORMAL LOW (ref 3.5–5.0)
Alkaline Phosphatase: 129 U/L — ABNORMAL HIGH (ref 38–126)
Anion gap: 10 (ref 5–15)
BUN: 9 mg/dL (ref 6–20)
CO2: 25 mmol/L (ref 22–32)
Calcium: 9.1 mg/dL (ref 8.9–10.3)
Chloride: 103 mmol/L (ref 98–111)
Creatinine, Ser: 0.94 mg/dL (ref 0.61–1.24)
GFR, Estimated: >60 mL/min (ref >60)
Glucose, Bld: 162 mg/dL — ABNORMAL HIGH (ref 70–99)
Potassium: 4 mmol/L (ref 3.5–5.1)
Sodium: 138 mmol/L (ref 135–145)
Total Bilirubin: 1.5 mg/dL — ABNORMAL HIGH (ref 0.3–1.2)
Total Protein: 6.8 g/dL (ref 6.5–8.1)

## 2023-02-20 MED ORDER — LISINOPRIL 10 MG PO TABS: 10.0000 mg | ORAL_TABLET | Freq: Every day | ORAL | 0 refills | Status: DC

## 2023-02-20 MED ORDER — LABETALOL HCL 5 MG/ML IV SOLN
20.0000 mg | Freq: Once | INTRAVENOUS | Status: AC
  Administered 2023-02-20: 20 mg via INTRAVENOUS
  Filled 2023-02-20: qty 4

## 2023-02-20 MED ORDER — LISINOPRIL 10 MG PO TABS
10.0000 mg | ORAL_TABLET | Freq: Once | ORAL | Status: AC
  Administered 2023-02-20: 10 mg via ORAL
  Filled 2023-02-20: qty 1

## 2023-02-20 MED ORDER — HYDROCHLOROTHIAZIDE 25 MG PO TABS: 25.0000 mg | ORAL_TABLET | Freq: Every day | ORAL | 0 refills | Status: DC

## 2023-02-20 NOTE — ED Triage Notes (Signed)
"  Was at PCP for visit, had been non compliant with all medications for 2 months. PCP called EMS to bring him to ED for hypertension. Patient has no complaints. Denies any signs or symptoms or pain" per EMS

## 2023-02-20 NOTE — ED Provider Notes (Signed)
[facility] Provider Note   CSN: [id number] Arrival date & time: 02/20/23  1000     History  Chief Complaint  Patient presents with   Hypertension    Sean Foster is a 47 y.o. male.   Hypertension     Patient has a history of diabetes, hypertension.  He also has a history of cirrhosis.  Patient presents to the ED for elevated blood pressure.  Patient states he has not had his medications for a few months.  He has been taking an over-the-counter medication for his blood pressure.  Patient states he went to his doctor's office today for routine checkup.  His blood pressure was noted to be very elevated.  He was sent to the emergency room for further evaluation because of his elevated blood pressure.  Patient is denying any chest pain.  No shortness of breath.  No headache.  No numbness or weakness  Home Medications Prior to Admission medications   Medication Sig Start Date End Date Taking? Authorizing Provider  lisinopril (ZESTRIL) 10 MG tablet Take 1 tablet (10 mg total) by mouth daily. 02/20/23 05/21/23 Yes Dorie Rank, MD  acetaminophen (TYLENOL) 500 MG tablet Take 1,000 mg by mouth every 6 (six) hours as needed for mild pain or headache.    [provider]  cyclobenzaprine (FLEXERIL) 10 MG tablet Take 1 tablet (10 mg total) by mouth 2 (two) times daily as needed for muscle spasms. 03/21/20   Dorie Rank, MD  doxycycline (VIBRAMYCIN) 100 MG capsule Take 1 capsule (100 mg total) by mouth 2 (two) times daily. 07/18/22   Davonna Belling, MD  furosemide (LASIX) 20 MG tablet Take 1 tablet (20 mg total) by mouth daily. 07/18/22   Davonna Belling, MD  hydrochlorothiazide (HYDRODIURIL) 25 MG tablet Take 1 tablet (25 mg total) by mouth daily. 02/20/23 05/21/23  Dorie Rank, MD  HYDROcodone-acetaminophen (NORCO/VICODIN) 5-325 MG tablet Take 1 tablet by mouth every 4 (four) hours as needed. 11/15/20   Henderly, Britni A, PA-C  insulin glargine  (LANTUS) 100 UNIT/ML Solostar Pen Inject 15 Units into the skin at bedtime. 04/17/20 07/18/22  Saverio Danker, PA-C  Insulin Pen Needle (PEN NEEDLES 3/16") 31G X 5 MM MISC 1 each by Does not apply route daily. 04/17/20   Saverio Danker, PA-C  metFORMIN (GLUCOPHAGE) 500 MG tablet Take 1 tablet (500 mg total) by mouth 2 (two) times daily with a meal. 04/17/20 07/18/22  Saverio Danker, PA-C  methocarbamol (ROBAXIN) 500 MG tablet Take 1 tablet (500 mg total) by mouth 2 (two) times daily. 05/16/21   Garald Balding, PA-C  oxyCODONE (OXY IR/ROXICODONE) 5 MG immediate release tablet Take 1-2 tablets (5-10 mg total) by mouth every 6 (six) hours as needed for moderate pain. 04/17/20   Saverio Danker, PA-C  amLODipine (NORVASC) 5 MG tablet Take 1 tablet (5 mg total) by mouth daily. Patient not taking: Reported on 06/16/2019 02/24/18 06/16/19  Virgel Manifold, MD      Allergies    Chloroquine and Chlorothen    Review of Systems   Review of Systems  Physical Exam Updated Vital Signs BP (!) 190/103   Pulse (!) 54   Temp 98 F (36.7 C) (Oral)   Resp 17   Ht 1.778 m (5\' 10" )   Wt 113.4 kg   SpO2 100%   BMI 35.87 kg/m  Physical Exam Vitals and nursing note reviewed.  Constitutional:      Appearance: He is well-developed. He is  not diaphoretic.  HENT:     Head: Normocephalic and atraumatic.     Right Ear: External ear normal.     Left Ear: External ear normal.  Eyes:     General: No scleral icterus.       Right eye: No discharge.        Left eye: No discharge.     Conjunctiva/sclera: Conjunctivae normal.  Neck:     Trachea: No tracheal deviation.  Cardiovascular:     Rate and Rhythm: Normal rate and regular rhythm.  Pulmonary:     Effort: Pulmonary effort is normal. No respiratory distress.     Breath sounds: Normal breath sounds. No stridor. No wheezing or rales.  Abdominal:     General: Bowel sounds are normal. There is no distension.     Palpations: Abdomen is soft.     Tenderness: There is  no abdominal tenderness. There is no guarding or rebound.  Musculoskeletal:        General: No tenderness or deformity.     Cervical back: Neck supple.  Skin:    General: Skin is warm and dry.     Findings: No rash.  Neurological:     General: No focal deficit present.     Mental Status: He is alert.     Cranial Nerves: No cranial nerve deficit, dysarthria or facial asymmetry.     Sensory: No sensory deficit.     Motor: No abnormal muscle tone or seizure activity.     Coordination: Coordination normal.  Psychiatric:        Mood and Affect: Mood normal.     ED Results / Procedures / Treatments   Labs (all labs ordered are listed, but only abnormal results are displayed) Labs Reviewed  CBC - Abnormal; Notable for the following components:      Result Value   Hemoglobin 11.8 (*)    HCT 38.5 (*)    MCV 75.6 (*)    MCH 23.2 (*)    All other components within normal limits  COMPREHENSIVE METABOLIC PANEL - Abnormal; Notable for the following components:   Glucose, Bld 162 (*)    Albumin 3.0 (*)    Alkaline Phosphatase 129 (*)    Total Bilirubin 1.5 (*)    All other components within normal limits    EKG EKG Interpretation  Date/Time:  Thursday February 20 2023 10:07:45 EDT Ventricular Rate:  57 PR Interval:  130 QRS Duration: 103 QT Interval:  432 QTC Calculation: 421 R Axis:   -24 Text Interpretation: Sinus rhythm Borderline left axis deviation Borderline T wave abnormalities ST elev, probable normal early repol pattern No significant change since last tracing Confirmed by Dorie Rank [id number]) on 02/20/2023 2:07:54 PM  Radiology No results found.  Procedures Procedures    Medications Ordered in ED Medications  labetalol (NORMODYNE) injection 20 mg (20 mg Intravenous Given 02/20/23 1037)  labetalol (NORMODYNE) injection 20 mg (20 mg Intravenous Given 02/20/23 1303)  lisinopril (ZESTRIL) tablet 10 mg (10 mg Oral Given 02/20/23 1302)    ED Course/ Medical Decision  Making/ A&P Clinical Course as of 02/20/23 1408  Thu Feb 20, 2023  [id number] CBC and metabolic panel normal.  Bilirubin elevated at 1.5 [JK]  1150 Bilirubin is decreased compared to previous values [JK]  1203 Blood pressure still severely elevated.  Additional medications ordered [JK]  1345 Blood pressure is improving at 179/107 [JK]  1402 Patient remains asymptomatic.  Will plan on discharge home [JK]  Clinical Course User Index [JK] Dorie Rank, MD                             Medical Decision Making Problems Addressed: Hypertension, unspecified type: acute illness or injury that poses a threat to life or bodily functions  Amount and/or Complexity of Data Reviewed Labs: ordered. Decision-making details documented in ED Course.  Risk Prescription drug management. Drug therapy requiring intensive monitoring for toxicity.   Patient presented to the ED for evaluation of elevated blood pressure.  Patient has known history of hypertension.  He has not been on his medication for couple of months.  Patient denies any trouble with any chest pain.  No headache.  No symptoms to suggest endorgan ischemia.  Laboratory test did not show any signs of any acute renal dysfunction.  EKG without concerning changes.  Doubt hypertensive emergency.  Patient was given IV doses of blood pressure medications as well as his oral blood pressure medications.  Blood pressure is slowly improving.  Will discharge home with oral blood pressure medications.  He was only on 10 mg of lisinopril daily.  I suspect he will need a higher dose.  Will have him check his blood pressure over the next couple days and if it remains elevated can increase to 20 mg daily.  Close follow-up with his PCP.        Final Clinical Impression(s) / ED Diagnoses Final diagnoses:  Hypertension, unspecified type    Rx / DC Orders ED Discharge Orders          Ordered    hydrochlorothiazide (HYDRODIURIL) 25 MG tablet  Daily         02/20/23 1406    lisinopril (ZESTRIL) 10 MG tablet  Daily        02/20/23 1406              Dorie Rank, MD 02/20/23 1408

## 2023-02-20 NOTE — Discharge Instructions (Addendum)
Start taking your blood pressure medications again daily.  Start your lisinopril back at the 10 mg dose.  Check your blood pressure over the next several days.  If you continue to have blood pressures with systolics greater than [id number] and diastolics greater than [id number] increase your lisinopril to 20 mg daily.  Follow-up with your primary care doctor to have your blood pressure rechecked.

## 2023-05-03 IMAGING — CT CT HEAD W/O CM
4 series · 16 of 47 positions shown, 18 images · non-contrast
Comparison: 03/21/2020

CLINICAL DATA: Chronic headaches



[Series 3: head bone · axial · 0.46mm/px · z∈[+205,+237]mm · 3 of 78 slices shown]
[im 8/78  bone]
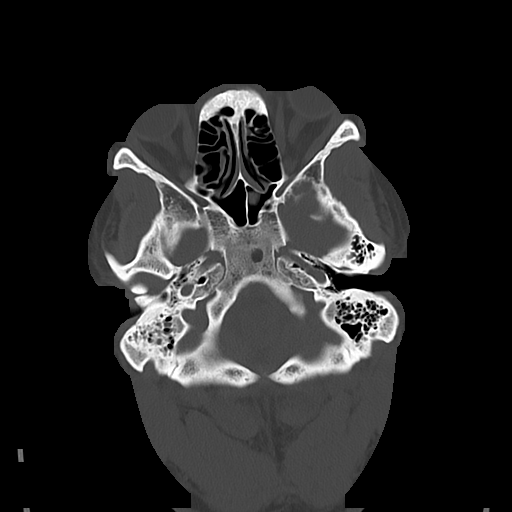
[im 16/78  bone]
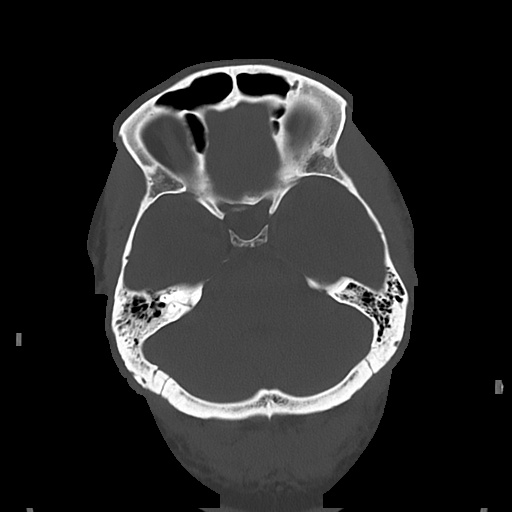
[im 24/78  bone]
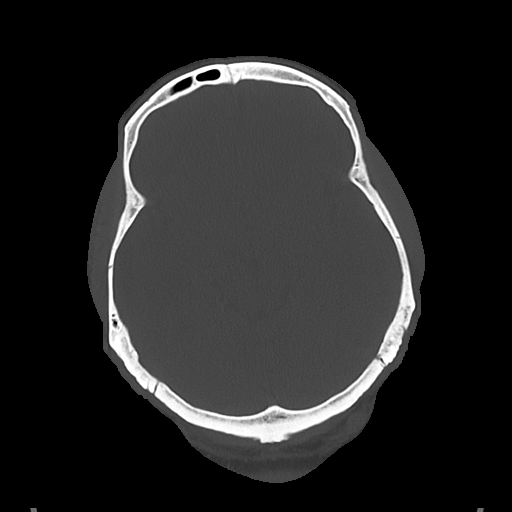

[Series 4: head wo · axial · 0.46mm/px · z∈[+206,+326]mm · 7 of 32 slices shown, 9 images]
[im 4/32  brain]
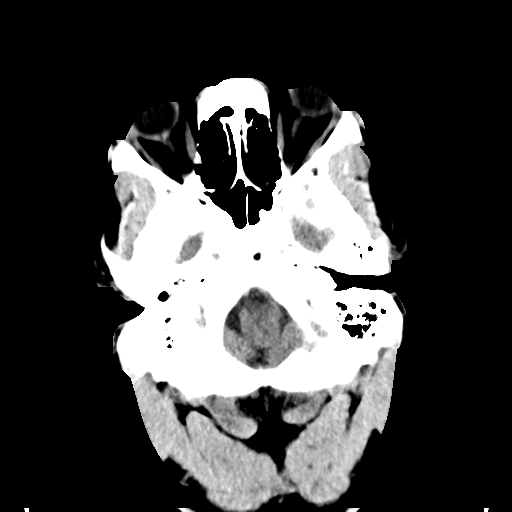
[im 4/32  bone]
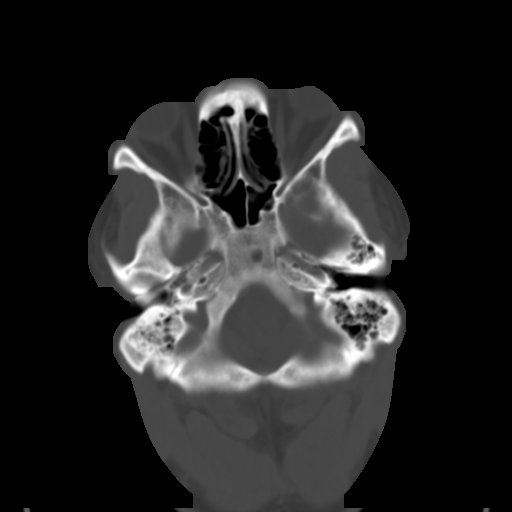
[im 8/32  brain]
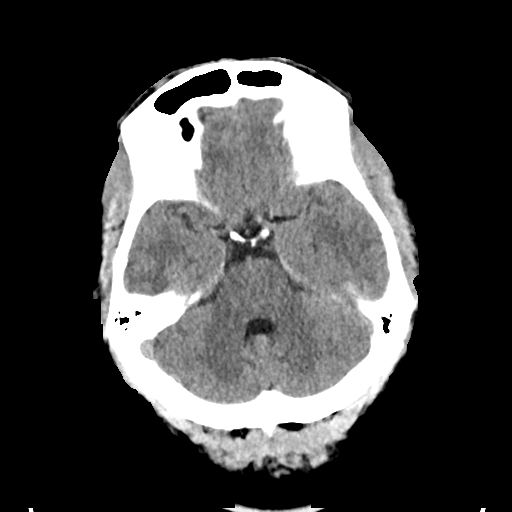
[im 12/32  brain]
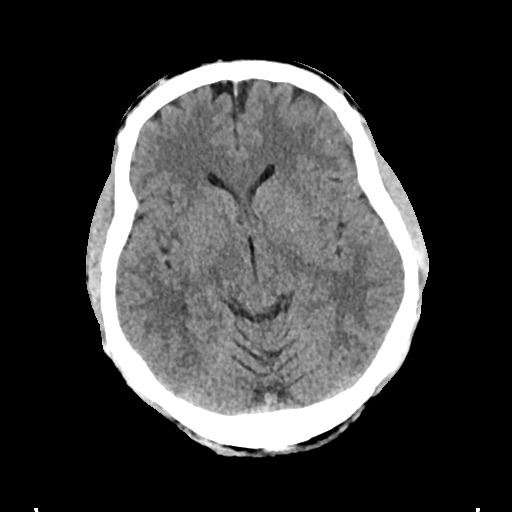
[im 16/32  brain]
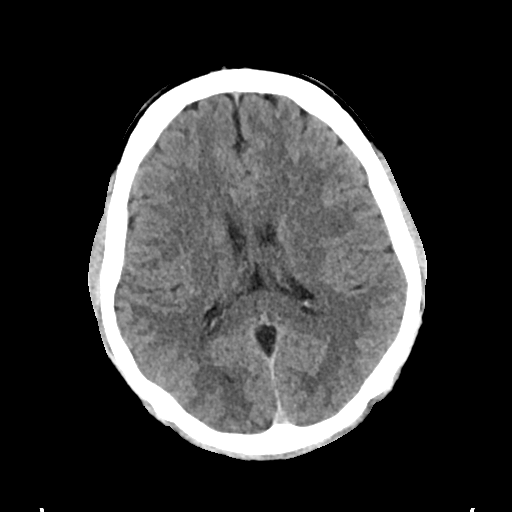
[im 20/32  brain]
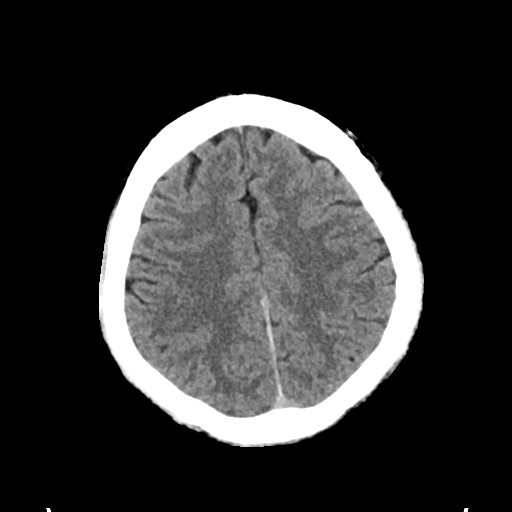
[im 20/32  bone]
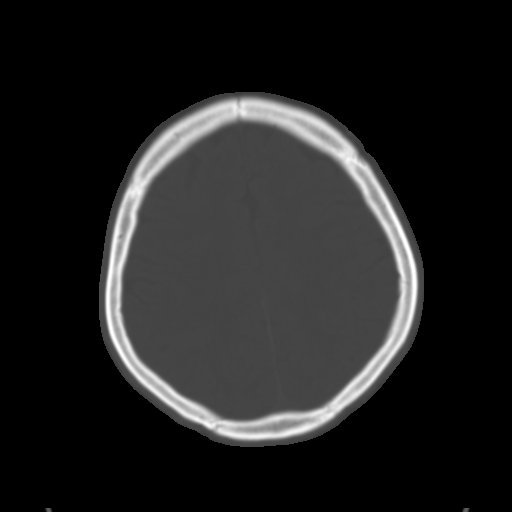
[im 24/32  brain]
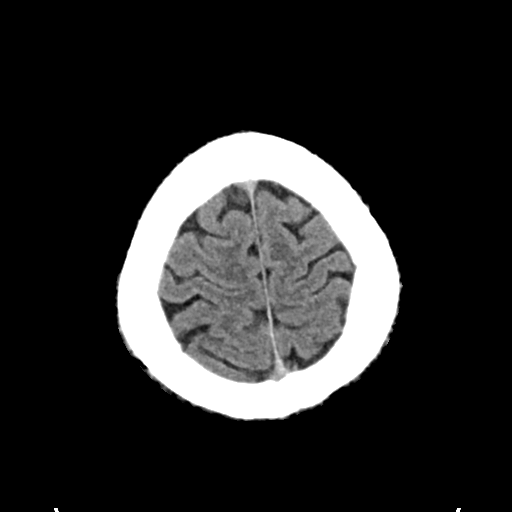
[im 28/32  brain]
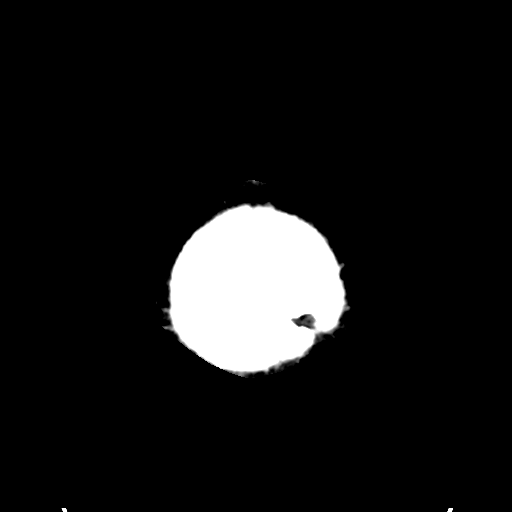

[Series 5: cor soft · coronal · 0.33mm/px · 3 of 74 slices shown]
[im 25/74  brain]
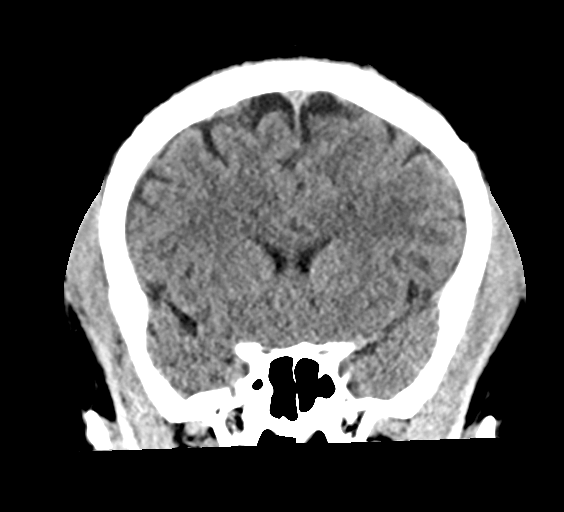
[im 33/74  brain]
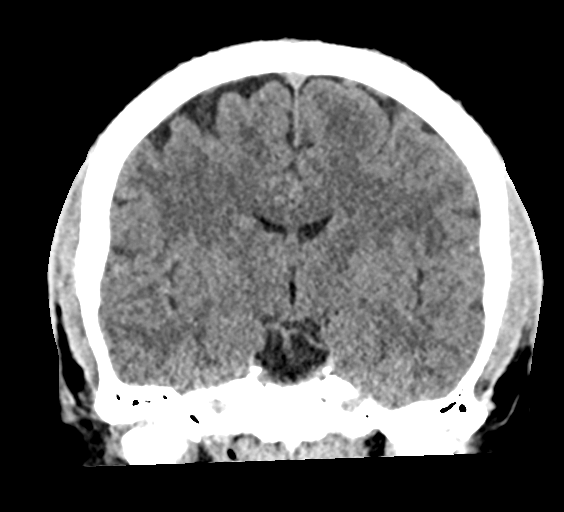
[im 41/74  brain]
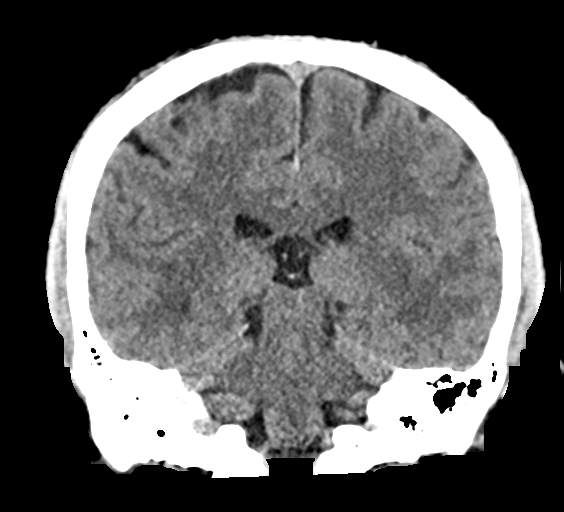

[Series 6: sag soft · sagittal · 0.33mm/px · 3 of 60 slices shown]
[im 20/60  brain]
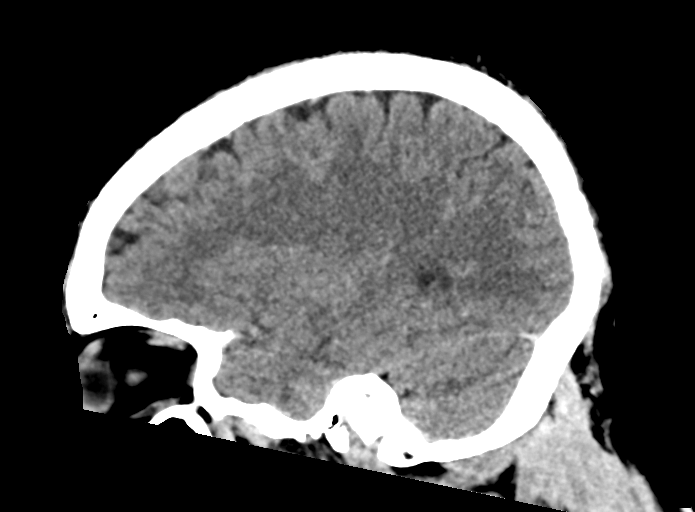
[im 30/60  brain]
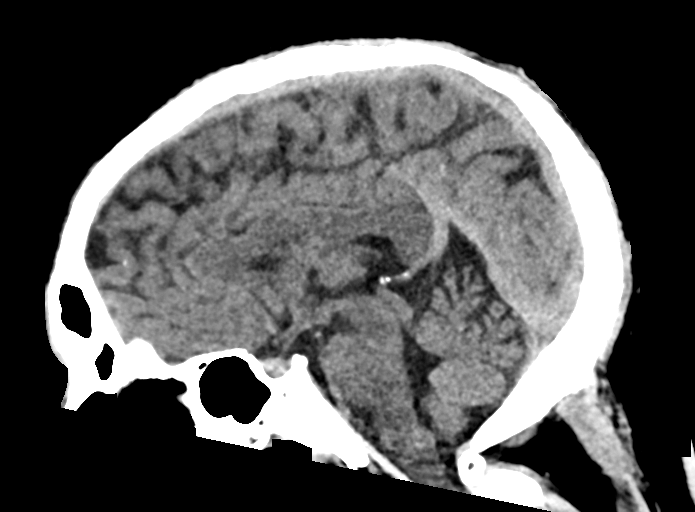
[im 40/60  brain]
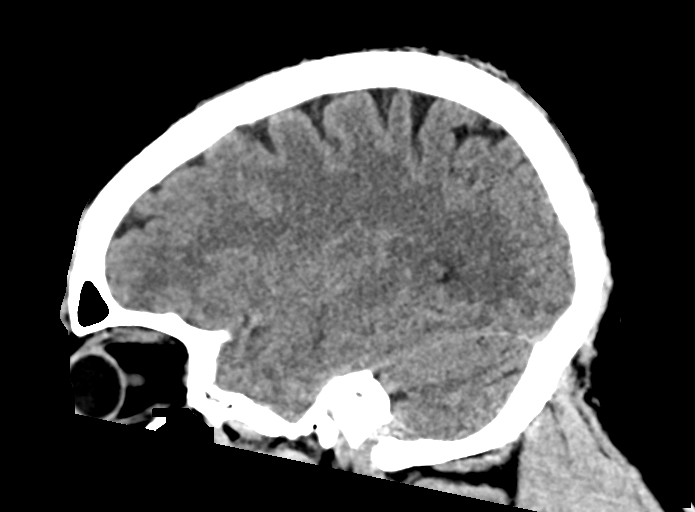

[16 of 47 positions shown; findings below may reference images not displayed]

FINDINGS: Brain: No evidence of acute infarction, hemorrhage, hydrocephalus,
extra-axial collection or mass lesion/mass effect.

Vascular: No hyperdense vessel or unexpected calcification.

Skull: Normal. Negative for fracture or focal lesion.

Sinuses/Orbits: No acute finding.

Other: None.
IMPRESSION: No acute intracranial abnormality noted. No change from the prior
exam.

## 2023-06-27 DIAGNOSIS — I10 Essential (primary) hypertension: Secondary | ICD-10-CM | POA: Diagnosis not present

## 2023-06-27 DIAGNOSIS — M62838 Other muscle spasm: Secondary | ICD-10-CM | POA: Diagnosis not present

## 2023-06-27 DIAGNOSIS — Z7689 Persons encountering health services in other specified circumstances: Secondary | ICD-10-CM | POA: Diagnosis not present

## 2023-06-27 DIAGNOSIS — E119 Type 2 diabetes mellitus without complications: Secondary | ICD-10-CM | POA: Diagnosis not present

## 2023-09-30 DIAGNOSIS — E113513 Type 2 diabetes mellitus with proliferative diabetic retinopathy with macular edema, bilateral: Secondary | ICD-10-CM | POA: Diagnosis not present

## 2023-11-13 DIAGNOSIS — F17210 Nicotine dependence, cigarettes, uncomplicated: Secondary | ICD-10-CM | POA: Diagnosis not present

## 2023-11-13 DIAGNOSIS — I10 Essential (primary) hypertension: Secondary | ICD-10-CM | POA: Diagnosis not present

## 2023-11-13 DIAGNOSIS — Z79899 Other long term (current) drug therapy: Secondary | ICD-10-CM | POA: Diagnosis not present

## 2023-11-13 DIAGNOSIS — H538 Other visual disturbances: Secondary | ICD-10-CM | POA: Diagnosis not present

## 2023-11-13 DIAGNOSIS — H3563 Retinal hemorrhage, bilateral: Secondary | ICD-10-CM | POA: Diagnosis not present

## 2023-11-13 DIAGNOSIS — R519 Headache, unspecified: Secondary | ICD-10-CM | POA: Diagnosis not present

## 2023-11-13 DIAGNOSIS — E113593 Type 2 diabetes mellitus with proliferative diabetic retinopathy without macular edema, bilateral: Secondary | ICD-10-CM | POA: Diagnosis not present

## 2023-11-20 DIAGNOSIS — E113553 Type 2 diabetes mellitus with stable proliferative diabetic retinopathy, bilateral: Secondary | ICD-10-CM | POA: Diagnosis not present

## 2024-01-05 ENCOUNTER — Emergency Department (HOSPITAL_COMMUNITY)
Admission: EM | Admit: 2024-01-05 | Discharge: 2024-01-05 | Disposition: A | Discharge status: Home / Self Care | Payer: Medicaid Other | Attending: Emergency Medicine | Admitting: Emergency Medicine

## 2024-01-05 ENCOUNTER — Encounter (HOSPITAL_COMMUNITY): Payer: Self-pay

## 2024-01-05 ENCOUNTER — Other Ambulatory Visit: Payer: Self-pay

## 2024-01-05 DIAGNOSIS — E119 Type 2 diabetes mellitus without complications: Secondary | ICD-10-CM | POA: Diagnosis not present

## 2024-01-05 DIAGNOSIS — Z794 Long term (current) use of insulin: Secondary | ICD-10-CM | POA: Diagnosis not present

## 2024-01-05 DIAGNOSIS — I10 Essential (primary) hypertension: Secondary | ICD-10-CM | POA: Diagnosis not present

## 2024-01-05 DIAGNOSIS — J101 Influenza due to other identified influenza virus with other respiratory manifestations: Secondary | ICD-10-CM | POA: Diagnosis not present

## 2024-01-05 DIAGNOSIS — Z7984 Long term (current) use of oral hypoglycemic drugs: Secondary | ICD-10-CM | POA: Diagnosis not present

## 2024-01-05 DIAGNOSIS — R059 Cough, unspecified: Secondary | ICD-10-CM | POA: Diagnosis present

## 2024-01-05 DIAGNOSIS — Z20822 Contact with and (suspected) exposure to covid-19: Secondary | ICD-10-CM | POA: Insufficient documentation

## 2024-01-05 DIAGNOSIS — Z79899 Other long term (current) drug therapy: Secondary | ICD-10-CM | POA: Insufficient documentation

## 2024-01-05 LAB — RESP PANEL BY RT-PCR (RSV, FLU A&B, COVID)  RVPGX2
Influenza A by PCR: POSITIVE — AB
Influenza B by PCR: NEGATIVE
Resp Syncytial Virus by PCR: NEGATIVE
SARS Coronavirus 2 by RT PCR: NEGATIVE

## 2024-01-05 MED ORDER — ACETAMINOPHEN 325 MG PO TABS
650.0000 mg | ORAL_TABLET | Freq: Once | ORAL | Status: AC
  Administered 2024-01-05: 650 mg via ORAL
  Filled 2024-01-05: qty 2

## 2024-01-05 NOTE — Discharge Instructions (Signed)
 You were seen today for influenza A.  Recommend that you self isolate until fever free for 24 hours.  Until then you can continue to take Tylenol  and ibuprofen  for fever control and bodyaches.  Take Tylenol  (acetominophen)  650mg  every 4-6 hours, as needed for pain or fever. Do not take more than 4,000 mg in a 24-hour period. As this may cause liver damage. While this is rare, if you begin to develop yellowing of the skin or eyes, stop taking and return to ER immediately.  Take Ibuprofen  400mg  every 4-6 hours for pain or fever, not exceeding 3,200 mg per day as more than 3,200mg  can cause Stomach irritation, dizziness, kidney issues with long-term use.  Be sure to go home and take your blood sugar patient does report sugar was elevated today in the ER.  Return to ED if you begin experiencing any new or worsening headache, neck pain, vision changes, chest pain, shortness of breath, or any new or worsening symptoms.

## 2024-01-05 NOTE — ED Triage Notes (Signed)
 BIB EMS from home for cough and fever since Saturday. Hx of hypertension and did not take medications today due to him not feeling well.

## 2024-01-05 NOTE — ED Provider Notes (Signed)
 [place] EMERGENCY DEPARTMENT AT [facility] Provider Note   CSN: [id number] Arrival date & time: 01/05/24  1307     History  Chief Complaint  Patient presents with   Cough    Sean Foster is a 48 y.o. male.   Cough Associated symptoms: fever    Patient is a 48 year old male presents to the ED today complaining of 2-day history of cough, congestion, fever.  Medical history of diabetes and hypertension.  States that he has had shortness of breath and chest pain with cough yesterday but today feels much better.  Denies any sick contacts.  Denies headache, neck pain, sore throat, chest pain, abdominal pain, diarrhea, dysuria, lower extremity swelling, lower extremity pain.    Home Medications Prior to Admission medications   Medication Sig Start Date End Date Taking? Authorizing Provider  acetaminophen  (TYLENOL ) 500 MG tablet Take 1,000 mg by mouth every 6 (six) hours as needed for mild pain or headache.    [provider]  cyclobenzaprine  (FLEXERIL ) 10 MG tablet Take 1 tablet (10 mg total) by mouth 2 (two) times daily as needed for muscle spasms. 03/21/20   Trish Furl, MD  doxycycline  (VIBRAMYCIN ) 100 MG capsule Take 1 capsule (100 mg total) by mouth 2 (two) times daily. 07/18/22   Mozell Arias, MD  furosemide  (LASIX ) 20 MG tablet Take 1 tablet (20 mg total) by mouth daily. 07/18/22   Mozell Arias, MD  hydrochlorothiazide  (HYDRODIURIL ) 25 MG tablet Take 1 tablet (25 mg total) by mouth daily. 02/20/23 05/21/23  Trish Furl, MD  HYDROcodone -acetaminophen  (NORCO/VICODIN) 5-325 MG tablet Take 1 tablet by mouth every 4 (four) hours as needed. 11/15/20   Henderly, Britni A, PA-C  insulin  glargine (LANTUS ) 100 UNIT/ML Solostar Pen Inject 15 Units into the skin at bedtime. 04/17/20 07/18/22  Marlin Simmonds, PA-C  Insulin  Pen Needle (PEN NEEDLES 3/16") 31G X 5 MM MISC 1 each by Does not apply route daily. 04/17/20   Marlin Simmonds, PA-C  lisinopril  (ZESTRIL ) 10 MG  tablet Take 1 tablet (10 mg total) by mouth daily. 02/20/23 05/21/23  Trish Furl, MD  metFORMIN  (GLUCOPHAGE ) 500 MG tablet Take 1 tablet (500 mg total) by mouth 2 (two) times daily with a meal. 04/17/20 07/18/22  Marlin Simmonds, PA-C  methocarbamol  (ROBAXIN ) 500 MG tablet Take 1 tablet (500 mg total) by mouth 2 (two) times daily. 05/16/21   Refugia Canton, PA-C  oxyCODONE  (OXY IR/ROXICODONE ) 5 MG immediate release tablet Take 1-2 tablets (5-10 mg total) by mouth every 6 (six) hours as needed for moderate pain. 04/17/20   Marlin Simmonds, PA-C  amLODipine  (NORVASC ) 5 MG tablet Take 1 tablet (5 mg total) by mouth daily. Patient not taking: Reported on 06/16/2019 02/24/18 06/16/19  Bart Born, MD      Allergies    Chloroquine and Chlorothen    Review of Systems   Review of Systems  Constitutional:  Positive for fever.  HENT:  Positive for congestion.   Respiratory:  Positive for cough.   All other systems reviewed and are negative.   Physical Exam Updated Vital Signs BP (!) 192/110 (BP Location: Left Arm)   Pulse 80   Temp 99.7 F (37.6 C)   Resp 17   Ht 5\' 10"  (1.778 m)   Wt 99.8 kg   SpO2 97%   BMI 31.57 kg/m  Physical Exam Vitals and nursing note reviewed.  Constitutional:      General: He is not in acute distress.    Appearance: Normal  appearance. He is ill-appearing.  HENT:     Head: Normocephalic and atraumatic.     Nose: Congestion present.     Mouth/Throat:     Mouth: Mucous membranes are moist.     Pharynx: Oropharynx is clear. No oropharyngeal exudate or posterior oropharyngeal erythema.  Eyes:     Extraocular Movements: Extraocular movements intact.     Conjunctiva/sclera: Conjunctivae normal.  Cardiovascular:     Rate and Rhythm: Normal rate and regular rhythm.     Pulses: Normal pulses.     Heart sounds: Normal heart sounds. No murmur heard.    No friction rub. No gallop.  Pulmonary:     Effort: Pulmonary effort is normal. No respiratory distress.     Breath  sounds: Normal breath sounds. No stridor. No wheezing, rhonchi or rales.  Abdominal:     General: Abdomen is flat.     Palpations: Abdomen is soft.     Tenderness: There is no abdominal tenderness.  Musculoskeletal:     Cervical back: Normal range of motion. No rigidity or tenderness.     Right lower leg: No edema.     Left lower leg: No edema.  Lymphadenopathy:     Cervical: No cervical adenopathy.  Skin:    General: Skin is warm and dry.     Coloration: Skin is not jaundiced or pale.     Findings: No erythema.  Neurological:     General: No focal deficit present.     Mental Status: He is alert and oriented to person, place, and time. Mental status is at baseline.     Motor: No weakness.     Gait: Gait normal.  Psychiatric:        Mood and Affect: Mood normal.     ED Results / Procedures / Treatments   Labs (all labs ordered are listed, but only abnormal results are displayed) Labs Reviewed  RESP PANEL BY RT-PCR (RSV, FLU A&B, COVID)  RVPGX2 - Abnormal; Notable for the following components:      Result Value   Influenza A by PCR POSITIVE (*)    All other components within normal limits    EKG None  Radiology No results found.  Procedures Procedures    Medications Ordered in ED Medications  acetaminophen  (TYLENOL ) tablet 650 mg (650 mg Oral Given 01/05/24 1320)    ED Course/ Medical Decision Making/ A&P                                 Medical Decision Making Risk OTC drugs.   This patient is a 48 year old male who presents to the ED for concern of cough, congestion, fever x 2 days.   Differential diagnoses prior to evaluation: The emergent differential diagnosis includes, but is not limited to, pneumonia, URI, meningitis, bronchitis,. This is not an exhaustive differential.   Past Medical History / Co-morbidities / Social History: Diabetes, hypertension, detachment of right eye.  Additional history: Chart reviewed. Pertinent results include:   Has  been previous admitted on 12/31/2023 for hypertension.  Discharged on HCTZ, Coreg , amlodipine , lisinopril .  Was also evaluated for stroke via MRI and consulted with neurology which did not indicate stroke at this time.  Lab Tests/Imaging studies: I personally interpreted labs/imaging and the pertinent results include:   Respiratory panel shows influenza A positive    Medications: I ordered medication including Tylenol .  I have reviewed the patients home medicines and have  made adjustments as needed.  ED Course:  Patient is a 47-year male presents today complaining of cough, tension, fever x 2 days.  He said that he experienced 1 onset of vomiting, chest pain, shortness of breath yesterday however today has had symptoms improved.  Was recently discharged for hypertensive urgency on 12/31/2023.  Denies headache, neck pain, sore throat, current chest pain, current shortness of breath, abdominal pain, dysuria, lower extremity swelling.  On physical exam, patient was noted to be mildly diaphoretic and warm.  Blood pressure was also noted to be elevated at 192/110.  Exam was otherwise unremarkable.  Patient reports having not taken any of his blood pressure medications today.  Patient provided Tylenol  due to fever.  Due to patient being currently symptomatic for hypertension, recommended that he go home and take his BP meds and continue to monitor for symptoms.  Low suspicion for any emergent pathology present this time.  Symptoms most notably from influenza A and seem to already be improving.  Vital signs also improved after taking Tylenol .  Provided strict return to ED precautions, I believe this patient is safe to discharge at this time.  Discussed case with attending who agreed with plan.   Disposition: After consideration of the diagnostic results and the patients response to treatment, I feel that patient benefit from discharge and treatment as above.   emergency department workup does not suggest an  emergent condition requiring admission or immediate intervention beyond what has been performed at this time. The plan is: Symptomatic control, monitor symptoms, isolate until fever free for 24 hours, return to the ED for any new or worsening symptoms. The patient is safe for discharge and has been instructed to return immediately for worsening symptoms, change in symptoms or any other concerns.   Final Clinical Impression(s) / ED Diagnoses Final diagnoses:  Influenza A    Rx / DC Orders ED Discharge Orders     None         Hayes Lipps, PA-C 01/05/24 1548    Merdis Stalling, MD 01/05/24 [phone]

## 2024-02-16 ENCOUNTER — Other Ambulatory Visit: Payer: Self-pay

## 2024-02-16 ENCOUNTER — Encounter (HOSPITAL_COMMUNITY): Payer: Self-pay

## 2024-02-16 ENCOUNTER — Emergency Department (HOSPITAL_COMMUNITY)
Admission: EM | Admit: 2024-02-16 | Discharge: 2024-02-17 | Disposition: A | Discharge status: Home / Self Care | Attending: Emergency Medicine | Admitting: Emergency Medicine

## 2024-02-16 ENCOUNTER — Emergency Department (HOSPITAL_COMMUNITY)

## 2024-02-16 DIAGNOSIS — E119 Type 2 diabetes mellitus without complications: Secondary | ICD-10-CM | POA: Insufficient documentation

## 2024-02-16 DIAGNOSIS — E876 Hypokalemia: Secondary | ICD-10-CM | POA: Insufficient documentation

## 2024-02-16 DIAGNOSIS — Z794 Long term (current) use of insulin: Secondary | ICD-10-CM | POA: Insufficient documentation

## 2024-02-16 DIAGNOSIS — R569 Unspecified convulsions: Secondary | ICD-10-CM | POA: Insufficient documentation

## 2024-02-16 DIAGNOSIS — I10 Essential (primary) hypertension: Secondary | ICD-10-CM | POA: Insufficient documentation

## 2024-02-16 DIAGNOSIS — R112 Nausea with vomiting, unspecified: Secondary | ICD-10-CM | POA: Insufficient documentation

## 2024-02-16 DIAGNOSIS — Z79899 Other long term (current) drug therapy: Secondary | ICD-10-CM | POA: Insufficient documentation

## 2024-02-16 DIAGNOSIS — Z7984 Long term (current) use of oral hypoglycemic drugs: Secondary | ICD-10-CM | POA: Diagnosis not present

## 2024-02-16 LAB — COMPREHENSIVE METABOLIC PANEL
ALT: 14 U/L (ref 0–44)
AST: 135 U/L — ABNORMAL HIGH (ref 15–41)
Albumin: 2.4 g/dL — ABNORMAL LOW (ref 3.5–5.0)
Alkaline Phosphatase: 292 U/L — ABNORMAL HIGH (ref 38–126)
Anion gap: 7 (ref 5–15)
BUN: 17 mg/dL (ref 6–20)
CO2: 26 mmol/L (ref 22–32)
Calcium: 8.4 mg/dL — ABNORMAL LOW (ref 8.9–10.3)
Chloride: 103 mmol/L (ref 98–111)
Creatinine, Ser: 1.27 mg/dL — ABNORMAL HIGH (ref 0.61–1.24)
GFR, Estimated: >60 mL/min (ref >60)
Glucose, Bld: 118 mg/dL — ABNORMAL HIGH (ref 70–99)
Potassium: 3.3 mmol/L — ABNORMAL LOW (ref 3.5–5.1)
Sodium: 136 mmol/L (ref 135–145)
Total Bilirubin: 2.8 mg/dL — ABNORMAL HIGH (ref 0.0–1.2)
Total Protein: 6.7 g/dL (ref 6.5–8.1)

## 2024-02-16 LAB — RAPID URINE DRUG SCREEN, HOSP PERFORMED
Amphetamines: NOT DETECTED
Barbiturates: NOT DETECTED
Benzodiazepines: NOT DETECTED
Cocaine: NOT DETECTED
Opiates: NOT DETECTED
Tetrahydrocannabinol: NOT DETECTED

## 2024-02-16 LAB — CBC
HCT: 37.2 % — ABNORMAL LOW (ref 39.0–52.0)
Hemoglobin: 12 g/dL — ABNORMAL LOW (ref 13.0–17.0)
MCH: 23.4 pg — ABNORMAL LOW (ref 26.0–34.0)
MCHC: 32.3 g/dL (ref 30.0–36.0)
MCV: 72.7 fL — ABNORMAL LOW (ref 80.0–100.0)
Platelets: 176 10*3/uL (ref 150–400)
RBC: 5.12 MIL/uL (ref 4.22–5.81)
RDW: 16.4 % — ABNORMAL HIGH (ref 11.5–15.5)
WBC: 5 10*3/uL (ref 4.0–10.5)
nRBC: 0 % (ref 0.0–0.2)

## 2024-02-16 LAB — PHOSPHORUS: Phosphorus: 2.9 mg/dL (ref 2.5–4.6)

## 2024-02-16 LAB — CBG MONITORING, ED: Glucose-Capillary: 125 mg/dL — ABNORMAL HIGH (ref 70–99)

## 2024-02-16 LAB — ETHANOL: Alcohol, Ethyl (B): <10 mg/dL (ref <10)

## 2024-02-16 LAB — PROTIME-INR
INR: 1 (ref 0.8–1.2)
Prothrombin Time: 13.8 s (ref 11.4–15.2)

## 2024-02-16 LAB — LIPASE, BLOOD: Lipase: 61 U/L — ABNORMAL HIGH (ref 11–51)

## 2024-02-16 LAB — MAGNESIUM: Magnesium: 2 mg/dL (ref 1.7–2.4)

## 2024-02-16 MED ORDER — MORPHINE SULFATE (PF) 4 MG/ML IV SOLN
4.0000 mg | Freq: Once | INTRAVENOUS | Status: AC
  Administered 2024-02-16: 4 mg via INTRAVENOUS
  Filled 2024-02-16: qty 1

## 2024-02-16 MED ORDER — POTASSIUM CHLORIDE CRYS ER 20 MEQ PO TBCR
40.0000 meq | EXTENDED_RELEASE_TABLET | Freq: Once | ORAL | Status: AC
  Administered 2024-02-16: 40 meq via ORAL
  Filled 2024-02-16: qty 2

## 2024-02-16 MED ORDER — LACTATED RINGERS IV BOLUS
1000.0000 mL | Freq: Once | INTRAVENOUS | Status: AC
  Administered 2024-02-16: 1000 mL via INTRAVENOUS

## 2024-02-16 MED ORDER — HYDRALAZINE HCL 25 MG PO TABS
25.0000 mg | ORAL_TABLET | Freq: Once | ORAL | Status: AC
  Administered 2024-02-16: 25 mg via ORAL
  Filled 2024-02-16: qty 1

## 2024-02-16 MED ORDER — CARVEDILOL 12.5 MG PO TABS
25.0000 mg | ORAL_TABLET | Freq: Once | ORAL | Status: AC
  Administered 2024-02-16: 25 mg via ORAL
  Filled 2024-02-16: qty 2

## 2024-02-16 MED ORDER — ONDANSETRON HCL 4 MG/2ML IJ SOLN
4.0000 mg | Freq: Once | INTRAMUSCULAR | Status: AC
  Administered 2024-02-16: 4 mg via INTRAVENOUS
  Filled 2024-02-16: qty 2

## 2024-02-16 MED ORDER — GADOBUTROL 1 MMOL/ML IV SOLN
10.0000 mL | Freq: Once | INTRAVENOUS | Status: AC | PRN
  Administered 2024-02-16: 10 mL via INTRAVENOUS

## 2024-02-16 MED ORDER — DOXAZOSIN MESYLATE 2 MG PO TABS
2.0000 mg | ORAL_TABLET | Freq: Once | ORAL | Status: AC
  Administered 2024-02-16: 2 mg via ORAL
  Filled 2024-02-16 (×2): qty 1

## 2024-02-16 NOTE — ED Triage Notes (Addendum)
 PER EMS: pt is from work with c/o vomiting. His daughter reports she witnessed full body shaking, had to lower him to the ground.EMS arrived to find him confused, lethargic and hard to arouse. After a few minutes he became more alert. He is A&Ox4, complains of generalized weakness. No hx of seizures. No oral trauma. Eyes appear jaundiced. He reports having severe abdominal pain prior to the seizure like activity, ,but has since resolved.  BP- 240 systolic palpated, HR-54 (takes betablocker) CBG-138

## 2024-02-16 NOTE — Discharge Instructions (Signed)
 Please be aware you may have another seizure  Do not drive until seen by your physician for your condition  Do not climb ladders/roofs/trees as a seizure can occur at that height and cause serious harm  Do not bathe/swim alone as a seizure can occur and cause serious harm  Please followup with your physician or neurologist for further testing and possible treatment

## 2024-02-16 NOTE — ED Provider Notes (Signed)
 [place] EMERGENCY DEPARTMENT AT [facility] Provider Note   CSN: [id number] Arrival date & time: 02/16/24  1545     History  Chief Complaint  Patient presents with   Seizures    Sean Foster is a 48 y.o. male.  Patient is a 48 year old male with a past medical history of hypertension and diabetes presenting to the emergency department with possible seizure.  Patient reports for the last 2 days he has had some nausea and vomiting and abdominal bloating after eating.  Today his daughter reportedly saw him have seizure-like activity after vomiting and 911 was called.  The patient states he has not worked call this happening and woke up in the ambulance.  He states that he has had no headaches, fevers.  He denies any alcohol or drug use.  He denies any associated abdominal pain.  He states he does not feel like he bit his tongue or had urinary incontinence.  He denies any prior history of seizures.  The history is provided by the patient and the EMS personnel.  Seizures      Home Medications Prior to Admission medications   Medication Sig Start Date End Date Taking? Authorizing Provider  acetaminophen (TYLENOL) 500 MG tablet Take 1,000 mg by mouth every 6 (six) hours as needed for mild pain or headache.    [provider]  cyclobenzaprine (FLEXERIL) 10 MG tablet Take 1 tablet (10 mg total) by mouth 2 (two) times daily as needed for muscle spasms. 03/21/20   Linwood Dibbles, MD  doxycycline (VIBRAMYCIN) 100 MG capsule Take 1 capsule (100 mg total) by mouth 2 (two) times daily. 07/18/22   Benjiman Core, MD  furosemide (LASIX) 20 MG tablet Take 1 tablet (20 mg total) by mouth daily. 07/18/22   Benjiman Core, MD  hydrochlorothiazide (HYDRODIURIL) 25 MG tablet Take 1 tablet (25 mg total) by mouth daily. 02/20/23 05/21/23  Linwood Dibbles, MD  HYDROcodone-acetaminophen (NORCO/VICODIN) 5-325 MG tablet Take 1 tablet by mouth every 4 (four) hours as needed. 11/15/20   Henderly,  Britni A, PA-C  insulin glargine (LANTUS) 100 UNIT/ML Solostar Pen Inject 15 Units into the skin at bedtime. 04/17/20 07/18/22  Barnetta Chapel, PA-C  Insulin Pen Needle (PEN NEEDLES 3/16") 31G X 5 MM MISC 1 each by Does not apply route daily. 04/17/20   Barnetta Chapel, PA-C  lisinopril (ZESTRIL) 10 MG tablet Take 1 tablet (10 mg total) by mouth daily. 02/20/23 05/21/23  Linwood Dibbles, MD  metFORMIN (GLUCOPHAGE) 500 MG tablet Take 1 tablet (500 mg total) by mouth 2 (two) times daily with a meal. 04/17/20 07/18/22  Barnetta Chapel, PA-C  methocarbamol (ROBAXIN) 500 MG tablet Take 1 tablet (500 mg total) by mouth 2 (two) times daily. 05/16/21   Mare Ferrari, PA-C  oxyCODONE (OXY IR/ROXICODONE) 5 MG immediate release tablet Take 1-2 tablets (5-10 mg total) by mouth every 6 (six) hours as needed for moderate pain. 04/17/20   Barnetta Chapel, PA-C  amLODipine (NORVASC) 5 MG tablet Take 1 tablet (5 mg total) by mouth daily. Patient not taking: Reported on 06/16/2019 02/24/18 06/16/19  Raeford Razor, MD      Allergies    Chloroquine and Chlorothen    Review of Systems   Review of Systems  Neurological:  Positive for seizures.    Physical Exam Updated Vital Signs BP (!) 165/95   Pulse (!) 58   Temp 98.1 F (36.7 C) (Oral)   Resp 17   Ht 5\' 10"  (1.778 m)  Wt 99.8 kg   SpO2 100%   BMI 31.57 kg/m  Physical Exam Vitals and nursing note reviewed.  Constitutional:      General: He is not in acute distress.    Appearance: Normal appearance.  HENT:     Head: Normocephalic and atraumatic.     Nose: Nose normal.     Mouth/Throat:     Mouth: Mucous membranes are moist.     Pharynx: Oropharynx is clear.     Comments: No evidence of tongue bite Eyes:     Extraocular Movements: Extraocular movements intact.     Pupils: Pupils are equal, round, and reactive to light.  Cardiovascular:     Rate and Rhythm: Normal rate and regular rhythm.     Heart sounds: Normal heart sounds.  Pulmonary:     Effort:  Pulmonary effort is normal.     Breath sounds: Normal breath sounds.  Abdominal:     General: Abdomen is flat.     Palpations: Abdomen is soft.     Tenderness: There is no abdominal tenderness.  Musculoskeletal:        General: Normal range of motion.     Cervical back: Normal range of motion and neck supple.  Skin:    General: Skin is warm and dry.  Neurological:     General: No focal deficit present.     Mental Status: He is alert.     Cranial Nerves: No cranial nerve deficit.     Sensory: No sensory deficit.     Motor: No weakness.  Psychiatric:        Mood and Affect: Mood normal.        Behavior: Behavior normal.     ED Results / Procedures / Treatments   Labs (all labs ordered are listed, but only abnormal results are displayed) Labs Reviewed  CBC - Abnormal; Notable for the following components:      Result Value   Hemoglobin 12.0 (*)    HCT 37.2 (*)    MCV 72.7 (*)    MCH 23.4 (*)    RDW 16.4 (*)    All other components within normal limits  LIPASE, BLOOD - Abnormal; Notable for the following components:   Lipase 61 (*)    All other components within normal limits  COMPREHENSIVE METABOLIC PANEL - Abnormal; Notable for the following components:   Potassium 3.3 (*)    Glucose, Bld 118 (*)    Creatinine, Ser 1.27 (*)    Calcium 8.4 (*)    Albumin 2.4 (*)    AST 135 (*)    Alkaline Phosphatase 292 (*)    Total Bilirubin 2.8 (*)    All other components within normal limits  CBG MONITORING, ED - Abnormal; Notable for the following components:   Glucose-Capillary 125 (*)    All other components within normal limits  ETHANOL  RAPID URINE DRUG SCREEN, HOSP PERFORMED  MAGNESIUM  PHOSPHORUS  PROTIME-INR  AMMONIA    EKG EKG Interpretation Date/Time:  Monday February 16 2024 16:06:51 EDT Ventricular Rate:  58 PR Interval:  134 QRS Duration:  112 QT Interval:  418 QTC Calculation: 410 R Axis:   -23  Text Interpretation: Sinus bradycardia Incomplete right  bundle branch block Cannot rule out Anteroseptal infarct , age undetermined Abnormal ECG Since last tracing of earlier today No significant change was found Confirmed by Elayne Snare ([id number]) on 02/16/2024 4:30:45 PM  Radiology CT HEAD WO CONTRAST Result Date: 02/16/2024 CLINICAL DATA:  Seizure,  new-onset, no history of trauma EXAM: CT HEAD WITHOUT CONTRAST TECHNIQUE: Contiguous axial images were obtained from the base of the skull through the vertex without intravenous contrast. RADIATION DOSE REDUCTION: This exam was performed according to the departmental dose-optimization program which includes automated exposure control, adjustment of the mA and/or kV according to patient size and/or use of iterative reconstruction technique. COMPARISON:  CT head 12/13/2021 FINDINGS: Brain: No evidence of large-territorial acute infarction. No parenchymal hemorrhage. No mass lesion. No extra-axial collection. No mass effect or midline shift. No hydrocephalus. Basilar cisterns are patent. Vascular: No hyperdense vessel. Skull: No acute fracture or focal lesion. Sinuses/Orbits: Bilateral maxillary sinus polypoid-like mucosal thickening. Paranasal sinuses and mastoid air cells are clear. The orbits are unremarkable. Other: None. IMPRESSION: No acute intracranial abnormality. Electronically Signed   By: Tish Frederickson M.D.   On: 02/16/2024 21:02    Procedures Procedures    Medications Ordered in ED Medications  lactated ringers bolus 1,000 mL (0 mLs Intravenous Stopped 02/16/24 2102)  potassium chloride SA (KLOR-CON M) CR tablet 40 mEq (40 mEq Oral Given 02/16/24 1849)  carvedilol (COREG) tablet 25 mg (25 mg Oral Given 02/16/24 2024)  doxazosin (CARDURA) tablet 2 mg (2 mg Oral Given 02/16/24 2024)  hydrALAZINE (APRESOLINE) tablet 25 mg (25 mg Oral Given 02/16/24 2024)  ondansetron (ZOFRAN) injection 4 mg (4 mg Intravenous Given 02/16/24 2203)  morphine (PF) 4 MG/ML injection 4 mg (4 mg Intravenous Given 02/16/24  2203)  gadobutrol (GADAVIST) 1 MMOL/ML injection 10 mL (10 mLs Intravenous Contrast Given 02/16/24 2339)    ED Course/ Medical Decision Making/ A&P Clinical Course as of 02/16/24 2356  Mon Feb 16, 2024  1818 Elevated AST and bili, not significantly changed from baseline, mild AKI and hypokalemia. Will be given fluids and repletion. [VK]  2225 I spoke with Dr. Derry Lory with neurology who recommended MRI w/ and w/o contrast to r/o PRES or other intracranial abnormality as well as ammonia. If MRI negative can follow up outpatient for EEG. [VK]  2355 Patient signed out to Dr. Bebe Shaggy pending MRI and CTAP and reassessment. [VK]    Clinical Course User Index [VK] Rexford Maus, DO                                 Medical Decision Making This patient presents to the ED with chief complaint(s) of vomiting, seizure-like activity with pertinent past medical history of hypertension, diabetes which further complicates the presenting complaint. The complaint involves an extensive differential diagnosis and also carries with it a high risk of complications and morbidity.    The differential diagnosis includes ICH, mass effect, seizure, syncope, arrhythmia, anemia, dehydration, electro abnormality, hypo or hyperglycemia, intoxication or substance induced  Additional history obtained: Additional history obtained from EMS  Records reviewed Care Everywhere/External Records  ED Course and Reassessment: On patient's arrival he is hypertensive and otherwise hemodynamically stable in no acute distress and is at his neurologic baseline.  Per EMS report did seem to be postictal on their arrival and is now back to his baseline.  EKG showed normal sinus rhythm without acute ischemic changes.  Patient will of labs and head CT imaging to evaluate for possible etiology of seizure today.  Likely will need neurology consult for new onset seizure.  Will be placed on seizure precautions and will be closely  reassessed.  Independent labs interpretation:  The following labs were independently interpreted: mildly elevated bili  and alk phos similar to prior, otherwise no acute abnormality  Independent visualization of imaging: - I independently visualized the following imaging with scope of interpretation limited to determining acute life threatening conditions related to emergency care: CTH, which revealed no acute disease  Consultation: - Consulted or discussed management/test interpretation w/ external professional: neurology  Amount and/or Complexity of Data Reviewed Labs: ordered. Radiology: ordered.  Risk Prescription drug management.          Final Clinical Impression(s) / ED Diagnoses Final diagnoses:  Seizure-like activity (HCC)  Nausea and vomiting, unspecified vomiting type    Rx / DC Orders ED Discharge Orders          Ordered    Ambulatory referral to Neurology       Comments: An appointment is requested in approximately: 2 weeks   02/16/24 2350              Rexford Maus, DO 02/16/24 2356

## 2024-02-16 NOTE — ED Provider Notes (Signed)
 Plan at signout to f/u on MRI and CT imaging If improved and imaging negative, can be discharged with neuro f/u and driving restrictions   Zadie Rhine, MD 02/16/24 2350

## 2024-02-16 NOTE — Plan of Care (Signed)
 Briefly, Mr. Sean Foster is a 48 y.o. male with hx of uncontrolled hypertension who p/w first time seizure, nausea, vomiting. He had a witnessed seizure at home, was post ictal for EMS. Back to baseline here. CT Head negative, EtOH levels undetectable, UDS negative, none of his meds would lower seizure threshold. Has been very hypertensive here. Per triage notes, eyes appear jaundiced. LFT with AST > ALT, T bili elevated and ED team evaluating this.  Odd to have a first time seizure at this age. Possible PRES with hypertension. Recommend getting MRI Brain with and without contrast. No driving for 6 months. Has to be seizure free before he can start driving. Check ammonia levels.  Defer workup for deranged LFT to primary team. Okay to discharge with outpatient neurology follow up and outpatient rEEG if MRI Brain w/o contrast is non revealing. No driving for 6 months, has to be seizure free for 6 months, before he can resume driving. No AEDs at this time.  Plan discussed with Dr. Theresia Lo with the ED team.  Erick Blinks [facility]

## 2024-02-17 ENCOUNTER — Emergency Department (HOSPITAL_COMMUNITY)

## 2024-02-17 DIAGNOSIS — R569 Unspecified convulsions: Secondary | ICD-10-CM | POA: Diagnosis not present

## 2024-02-17 LAB — AMMONIA: Ammonia: 34 umol/L (ref 9–35)

## 2024-02-17 MED ORDER — IOHEXOL 350 MG/ML SOLN
75.0000 mL | Freq: Once | INTRAVENOUS | Status: AC | PRN
  Administered 2024-02-17: 75 mL via INTRAVENOUS

## 2024-02-17 NOTE — ED Provider Notes (Signed)
 Overall patient is improved.  His blood pressures improved. He is awake alert resting comfortably.  He has no focal weakness. He is safe for discharge home.  He was told no driving, no bathing alone.  He gave permission to speak to his son Dickie La He was also informed that patient cannot drive until he is seen by neurology.  Referral to neurology has been placed.  He will also need PCP follow-up for his blood pressure   Zadie Rhine, MD 02/17/24 0202

## 2024-02-18 ENCOUNTER — Ambulatory Visit: Admitting: Neurology

## 2024-02-18 ENCOUNTER — Encounter: Payer: Self-pay | Admitting: Neurology

## 2024-03-08 HISTORY — PX: RETINAL DETACHMENT SURGERY: SHX105

## 2024-03-14 ENCOUNTER — Encounter (HOSPITAL_COMMUNITY): Payer: Self-pay

## 2024-03-14 ENCOUNTER — Emergency Department (HOSPITAL_COMMUNITY)

## 2024-03-14 ENCOUNTER — Observation Stay (HOSPITAL_COMMUNITY)
Admission: EM | Admit: 2024-03-14 | Discharge: 2024-03-16 | Disposition: A | Discharge status: Home / Self Care | Attending: Family Medicine | Admitting: Family Medicine

## 2024-03-14 ENCOUNTER — Other Ambulatory Visit: Payer: Self-pay

## 2024-03-14 DIAGNOSIS — Z794 Long term (current) use of insulin: Secondary | ICD-10-CM | POA: Diagnosis not present

## 2024-03-14 DIAGNOSIS — E119 Type 2 diabetes mellitus without complications: Secondary | ICD-10-CM | POA: Diagnosis not present

## 2024-03-14 DIAGNOSIS — R109 Unspecified abdominal pain: Secondary | ICD-10-CM | POA: Diagnosis present

## 2024-03-14 DIAGNOSIS — I5022 Chronic systolic (congestive) heart failure: Secondary | ICD-10-CM | POA: Insufficient documentation

## 2024-03-14 DIAGNOSIS — R7401 Elevation of levels of liver transaminase levels: Secondary | ICD-10-CM | POA: Insufficient documentation

## 2024-03-14 DIAGNOSIS — D689 Coagulation defect, unspecified: Secondary | ICD-10-CM | POA: Insufficient documentation

## 2024-03-14 DIAGNOSIS — E872 Acidosis, unspecified: Secondary | ICD-10-CM | POA: Insufficient documentation

## 2024-03-14 DIAGNOSIS — F17210 Nicotine dependence, cigarettes, uncomplicated: Secondary | ICD-10-CM | POA: Diagnosis not present

## 2024-03-14 DIAGNOSIS — I110 Hypertensive heart disease with heart failure: Secondary | ICD-10-CM | POA: Insufficient documentation

## 2024-03-14 DIAGNOSIS — K7460 Unspecified cirrhosis of liver: Secondary | ICD-10-CM | POA: Insufficient documentation

## 2024-03-14 DIAGNOSIS — R1011 Right upper quadrant pain: Principal | ICD-10-CM | POA: Insufficient documentation

## 2024-03-14 DIAGNOSIS — Z79899 Other long term (current) drug therapy: Secondary | ICD-10-CM | POA: Insufficient documentation

## 2024-03-14 DIAGNOSIS — R770 Abnormality of albumin: Secondary | ICD-10-CM | POA: Insufficient documentation

## 2024-03-14 DIAGNOSIS — R17 Unspecified jaundice: Principal | ICD-10-CM

## 2024-03-14 LAB — URINALYSIS, ROUTINE W REFLEX MICROSCOPIC
Glucose, UA: NEGATIVE mg/dL
Ketones, ur: NEGATIVE mg/dL
Leukocytes,Ua: NEGATIVE
Nitrite: NEGATIVE
Protein, ur: >=300 mg/dL — AB
Specific Gravity, Urine: 1.027 (ref 1.005–1.030)
pH: 5 (ref 5.0–8.0)

## 2024-03-14 LAB — PROTIME-INR
INR: 1.4 — ABNORMAL HIGH (ref 0.8–1.2)
Prothrombin Time: 17.8 s — ABNORMAL HIGH (ref 11.4–15.2)

## 2024-03-14 LAB — COMPREHENSIVE METABOLIC PANEL WITH GFR
ALT: 60 U/L — ABNORMAL HIGH (ref 0–44)
AST: 449 U/L — ABNORMAL HIGH (ref 15–41)
Albumin: 1.9 g/dL — ABNORMAL LOW (ref 3.5–5.0)
Alkaline Phosphatase: 194 U/L — ABNORMAL HIGH (ref 38–126)
Anion gap: 6 (ref 5–15)
BUN: 17 mg/dL (ref 6–20)
CO2: 20 mmol/L — ABNORMAL LOW (ref 22–32)
Calcium: 8 mg/dL — ABNORMAL LOW (ref 8.9–10.3)
Chloride: 107 mmol/L (ref 98–111)
Creatinine, Ser: 0.96 mg/dL (ref 0.61–1.24)
GFR, Estimated: >60 mL/min (ref >60)
Glucose, Bld: 146 mg/dL — ABNORMAL HIGH (ref 70–99)
Potassium: 3.7 mmol/L (ref 3.5–5.1)
Sodium: 133 mmol/L — ABNORMAL LOW (ref 135–145)
Total Bilirubin: 15.4 mg/dL — ABNORMAL HIGH (ref 0.0–1.2)
Total Protein: 6.7 g/dL (ref 6.5–8.1)

## 2024-03-14 LAB — RAPID URINE DRUG SCREEN, HOSP PERFORMED
Amphetamines: NOT DETECTED
Barbiturates: NOT DETECTED
Benzodiazepines: NOT DETECTED
Cocaine: NOT DETECTED
Opiates: NOT DETECTED
Tetrahydrocannabinol: NOT DETECTED

## 2024-03-14 LAB — CBC
HCT: 32.5 % — ABNORMAL LOW (ref 39.0–52.0)
Hemoglobin: 11.2 g/dL — ABNORMAL LOW (ref 13.0–17.0)
MCH: 23.2 pg — ABNORMAL LOW (ref 26.0–34.0)
MCHC: 34.5 g/dL (ref 30.0–36.0)
MCV: 67.4 fL — ABNORMAL LOW (ref 80.0–100.0)
Platelets: 161 10*3/uL (ref 150–400)
RBC: 4.82 MIL/uL (ref 4.22–5.81)
RDW: 22.1 % — ABNORMAL HIGH (ref 11.5–15.5)
WBC: 6.4 10*3/uL (ref 4.0–10.5)
nRBC: 0 % (ref 0.0–0.2)

## 2024-03-14 LAB — LIPASE, BLOOD: Lipase: 29 U/L (ref 11–51)

## 2024-03-14 LAB — ETHANOL: Alcohol, Ethyl (B): <10 mg/dL (ref <10)

## 2024-03-14 LAB — CBG MONITORING, ED: Glucose-Capillary: 182 mg/dL — ABNORMAL HIGH (ref 70–99)

## 2024-03-14 LAB — ACETAMINOPHEN LEVEL: Acetaminophen (Tylenol), Serum: <10 ug/mL — ABNORMAL LOW (ref 10–30)

## 2024-03-14 MED ORDER — OXYCODONE HCL 5 MG PO TABS
5.0000 mg | ORAL_TABLET | Freq: Once | ORAL | Status: AC
  Administered 2024-03-14: 5 mg via ORAL
  Filled 2024-03-14: qty 1

## 2024-03-14 MED ORDER — IOHEXOL 300 MG/ML  SOLN
100.0000 mL | Freq: Once | INTRAMUSCULAR | Status: AC | PRN
  Administered 2024-03-14: 100 mL via INTRAVENOUS

## 2024-03-14 MED ORDER — OXYCODONE HCL 5 MG PO TABS
5.0000 mg | ORAL_TABLET | Freq: Once | ORAL | Status: DC
  Filled 2024-03-14: qty 1

## 2024-03-14 MED ORDER — ALBUMIN HUMAN 25 % IV SOLN
25.0000 g | Freq: Four times a day (QID) | INTRAVENOUS | Status: AC
  Administered 2024-03-15 (×2): 25 g via INTRAVENOUS
  Filled 2024-03-14 (×2): qty 100

## 2024-03-14 NOTE — ED Triage Notes (Signed)
 Pt presents with 2 week abd pain, bloating, N/V/D, ShOB, generalized weakness, jaundice, and dark urine x 2 weeks and symptoms have been consistent. He states he has had similar symptoms in the past and the problem was his liver. He denies any ETOH use. Denies sick contacts.

## 2024-03-14 NOTE — ED Provider Notes (Signed)
 [place] EMERGENCY DEPARTMENT AT [facility] Provider Note   CSN: [id number] Arrival date & time: 03/14/24  1315     History  Chief Complaint  Patient presents with   Abdominal Pain    [name] is a 48 y.o. male.  HPI 48 year old male presents with abdominal pain.  He has been dealing with right lower abdominal pain for about 2 weeks.  He also feels like his abdomen is bloated and for the past week he has been having some vomiting.  He is having increased bowel movements, pretty much has diarrhea anytime he eats.  He has also been having dark urine during this time.  About a month ago he was taking Tylenol  for pain and states that he took 3 500 mg tablets in the morning and then 3 at night.  He did this for about 15 total days.  He stopped around 2 weeks ago and all of this started.  He denies any fevers.  He denies any alcohol use.  Home Medications Prior to Admission medications   Medication Sig Start Date End Date Taking? Authorizing Provider  doxazosin  (CARDURA ) 2 MG tablet Take 2 mg by mouth at bedtime. 01/14/24  Yes [provider]  hydrochlorothiazide  (HYDRODIURIL ) 25 MG tablet Take 1 tablet (25 mg total) by mouth daily. 02/20/23 03/14/24 Yes Trish Furl, MD  Insulin  Pen Needle (PEN NEEDLES 3/16") 31G X 5 MM MISC 1 each by Does not apply route daily. 04/17/20   Marlin Simmonds, PA-C  amLODipine  (NORVASC ) 5 MG tablet Take 1 tablet (5 mg total) by mouth daily. Patient not taking: Reported on 06/16/2019 02/24/18 06/16/19  Bart Born, MD      Allergies    Chloroquine and Chlorothen    Review of Systems   Review of Systems  Constitutional:  Positive for fatigue. Negative for fever.  Respiratory:  Negative for shortness of breath.   Gastrointestinal:  Positive for abdominal distention, abdominal pain, diarrhea and vomiting.  Genitourinary:  Negative for dysuria.    Physical Exam Updated Vital Signs BP (!) 177/101   Pulse (!) 59   Temp 98.7 F (37.1  C) (Oral)   Resp 16   Ht 5\' 10"  (1.778 m)   Wt 102.1 kg   SpO2 100%   BMI 32.28 kg/m  Physical Exam Vitals and nursing note reviewed.  Constitutional:      General: He is not in acute distress.    Appearance: He is well-developed. He is not ill-appearing or diaphoretic.  HENT:     Head: Normocephalic and atraumatic.  Eyes:     General: Scleral icterus present.  Cardiovascular:     Rate and Rhythm: Normal rate and regular rhythm.     Heart sounds: Normal heart sounds.  Pulmonary:     Effort: Pulmonary effort is normal.     Breath sounds: Normal breath sounds.  Abdominal:     Palpations: Abdomen is soft.     Tenderness: There is no abdominal tenderness.  Skin:    General: Skin is warm and dry.  Neurological:     Mental Status: He is alert.     ED Results / Procedures / Treatments   Labs (all labs ordered are listed, but only abnormal results are displayed) Labs Reviewed  COMPREHENSIVE METABOLIC PANEL WITH GFR - Abnormal; Notable for the following components:      Result Value   Sodium 133 (*)    CO2 20 (*)    Glucose, Bld 146 (*)  Calcium 8.0 (*)    Albumin  1.9 (*)    AST 449 (*)    ALT 60 (*)    Alkaline Phosphatase 194 (*)    Total Bilirubin 15.4 (*)    All other components within normal limits  CBC - Abnormal; Notable for the following components:   Hemoglobin 11.2 (*)    HCT 32.5 (*)    MCV 67.4 (*)    MCH 23.2 (*)    RDW 22.1 (*)    All other components within normal limits  URINALYSIS, ROUTINE W REFLEX MICROSCOPIC - Abnormal; Notable for the following components:   Color, Urine AMBER (*)    Hgb urine dipstick SMALL (*)    Bilirubin Urine MODERATE (*)    Protein, ur >=300 (*)    Bacteria, UA FEW (*)    All other components within normal limits  ACETAMINOPHEN  LEVEL - Abnormal; Notable for the following components:   Acetaminophen  (Tylenol ), Serum <10 (*)    All other components within normal limits  PROTIME-INR - Abnormal; Notable for the  following components:   Prothrombin Time 17.8 (*)    INR 1.4 (*)    All other components within normal limits  CBG MONITORING, ED - Abnormal; Notable for the following components:   Glucose-Capillary 182 (*)    All other components within normal limits  LIPASE, BLOOD  HEPATITIS PANEL, ACUTE  ETHANOL  RAPID URINE DRUG SCREEN, HOSP PERFORMED  HEPATITIS B SURFACE ANTIGEN  HEPATITIS B E ANTIGEN  HIV ANTIBODY (ROUTINE TESTING W REFLEX)    EKG None  Radiology CT ABDOMEN PELVIS W CONTRAST Result Date: 03/14/2024 CLINICAL DATA:  Hepatitis, RLQ pain. EXAM: CT ABDOMEN AND PELVIS WITH CONTRAST TECHNIQUE: Multidetector CT imaging of the abdomen and pelvis was performed using the standard protocol following bolus administration of intravenous contrast. RADIATION DOSE REDUCTION: This exam was performed according to the departmental dose-optimization program which includes automated exposure control, adjustment of the mA and/or kV according to patient size and/or use of iterative reconstruction technique. CONTRAST:  OMNIPAQUE  IOHEXOL  300 MG/ML  SOLN COMPARISON:  02/16/2024. FINDINGS: Lower chest: Lung bases clear.  No pericardial or pleural effusions. Hepatobiliary: Nodular. The liver consistent with cirrhosis. No focal hepatic parenchymal lesions or biliary ductal dilatation. Gallbladder is distended. Pancreas: No pancreatic parenchymal abnormalities identified. There is fluid in the mesentery and retroperitoneum which is consistent with anasarca. Acute pancreatitis would not be excludable on this examination and correlation with labs is recommended. Spleen: Normal in size without focal abnormality. Adrenals/Urinary Tract: Adrenal glands are unremarkable. 2 cm cyst right kidney. No hydronephrosis or nephrolithiasis. Unremarkable urinary bladder. The cyst does not need to be followed up. Stomach/Bowel: Stomach is within normal limits. Appendix appears normal. No bowel dilatation to suggest obstruction.  There is diffuse colonic wall thickening consistent with colitis. No fluid collections are seen to suggest abscesses. Vascular/Lymphatic: Aortic atherosclerosis. No enlarged abdominal or pelvic lymph nodes. Reproductive: Prostate is unremarkable. Other: Diffuse subcutaneous edema, skin thickening, mesenteric edema and ascites consistent with anasarca. Musculoskeletal: No acute or significant osseous findings. IMPRESSION: 1. Diffuse colonic wall thickening consistent with colitis. 2. Cirrhosis. 3. Anasarca. 4. Right renal cyst. 5. Aortic atherosclerosis (ICD10-I70.0). Electronically Signed   By: Sydell Eva M.D.   On: 03/14/2024 18:38    Procedures Procedures    Medications Ordered in ED Medications  oxyCODONE  (Oxy IR/ROXICODONE ) immediate release tablet 5 mg (5 mg Oral Not Given 03/14/24 1615)  oxyCODONE  (Oxy IR/ROXICODONE ) immediate release tablet 5 mg (5 mg Oral  Given 03/14/24 1711)  iohexol  (OMNIPAQUE ) 300 MG/ML solution 100 mL (100 mLs Intravenous Contrast Given 03/14/24 1730)    ED Course/ Medical Decision Making/ A&P                                 Medical Decision Making Amount and/or Complexity of Data Reviewed External Data Reviewed: notes. Labs: ordered.    Details: Bilirubin 15 Radiology: ordered and independent interpretation performed.    Details: Mild ascites diffusely.  Risk Prescription drug management. Decision regarding hospitalization.   Patient is found to have a bilirubin of 15.  He does have a history of hepatitis B and cirrhosis though the patient seems unaware of this.  Discussed case with Dr. Lavaughn Portland.  He is recommending some extra hepatitis testing and general supportive care and gastroenterology will follow-up with the patient in the morning.  Recommends medical admission and I discussed with Dr. Del Favia.  He does not have any abdominal tenderness on exam, no fever, normal WBC.  There is some mild ascites but not enough to seemingly get with a paracentesis and  at this point my suspicion of SBP is pretty low.        Final Clinical Impression(s) / ED Diagnoses Final diagnoses:  Elevated bilirubin    Rx / DC Orders ED Discharge Orders          Ordered    Hepatitis delta antibody        03/14/24 1913              Jerilynn Montenegro, MD 03/14/24 2313

## 2024-03-15 ENCOUNTER — Encounter (HOSPITAL_COMMUNITY): Payer: Self-pay | Admitting: Internal Medicine

## 2024-03-15 DIAGNOSIS — R109 Unspecified abdominal pain: Secondary | ICD-10-CM | POA: Diagnosis not present

## 2024-03-15 LAB — IRON AND TIBC
Iron: 144 ug/dL (ref 45–182)
Saturation Ratios: 92 % — ABNORMAL HIGH (ref 17.9–39.5)
TIBC: 157 ug/dL — ABNORMAL LOW (ref 250–450)
UIBC: 13 ug/dL

## 2024-03-15 LAB — CBC
HCT: 27.2 % — ABNORMAL LOW (ref 39.0–52.0)
Hemoglobin: 9.5 g/dL — ABNORMAL LOW (ref 13.0–17.0)
MCH: 23.5 pg — ABNORMAL LOW (ref 26.0–34.0)
MCHC: 34.9 g/dL (ref 30.0–36.0)
MCV: 67.2 fL — ABNORMAL LOW (ref 80.0–100.0)
Platelets: 135 10*3/uL — ABNORMAL LOW (ref 150–400)
RBC: 4.05 MIL/uL — ABNORMAL LOW (ref 4.22–5.81)
RDW: 21.9 % — ABNORMAL HIGH (ref 11.5–15.5)
WBC: 6.3 10*3/uL (ref 4.0–10.5)
nRBC: 0 % (ref 0.0–0.2)

## 2024-03-15 LAB — MAGNESIUM: Magnesium: 1.9 mg/dL (ref 1.7–2.4)

## 2024-03-15 LAB — COMPREHENSIVE METABOLIC PANEL WITH GFR
ALT: 46 U/L — ABNORMAL HIGH (ref 0–44)
AST: 364 U/L — ABNORMAL HIGH (ref 15–41)
Albumin: 1.9 g/dL — ABNORMAL LOW (ref 3.5–5.0)
Alkaline Phosphatase: 164 U/L — ABNORMAL HIGH (ref 38–126)
Anion gap: 3 — ABNORMAL LOW (ref 5–15)
BUN: 19 mg/dL (ref 6–20)
CO2: 21 mmol/L — ABNORMAL LOW (ref 22–32)
Calcium: 7.8 mg/dL — ABNORMAL LOW (ref 8.9–10.3)
Chloride: 109 mmol/L (ref 98–111)
Creatinine, Ser: 1.16 mg/dL (ref 0.61–1.24)
GFR, Estimated: >60 mL/min (ref >60)
Glucose, Bld: 90 mg/dL (ref 70–99)
Potassium: 3.7 mmol/L (ref 3.5–5.1)
Sodium: 133 mmol/L — ABNORMAL LOW (ref 135–145)
Total Bilirubin: 14.7 mg/dL — ABNORMAL HIGH (ref 0.0–1.2)
Total Protein: 6.3 g/dL — ABNORMAL LOW (ref 6.5–8.1)

## 2024-03-15 LAB — HIV ANTIBODY (ROUTINE TESTING W REFLEX): HIV Screen 4th Generation wRfx: NONREACTIVE

## 2024-03-15 LAB — PHOSPHORUS: Phosphorus: 2.6 mg/dL (ref 2.5–4.6)

## 2024-03-15 LAB — GLUCOSE, CAPILLARY: Glucose-Capillary: 116 mg/dL — ABNORMAL HIGH (ref 70–99)

## 2024-03-15 LAB — FERRITIN: Ferritin: 1069 ng/mL — ABNORMAL HIGH (ref 24–336)

## 2024-03-15 LAB — PROTIME-INR
INR: 1.4 — ABNORMAL HIGH (ref 0.8–1.2)
Prothrombin Time: 17.3 s — ABNORMAL HIGH (ref 11.4–15.2)

## 2024-03-15 MED ORDER — LACTULOSE 10 GM/15ML PO SOLN: 10.0000 g | Freq: Every day | ORAL | Status: DC | PRN

## 2024-03-15 MED ORDER — HYDROCHLOROTHIAZIDE 25 MG PO TABS
25.0000 mg | ORAL_TABLET | Freq: Every day | ORAL | Status: DC
  Administered 2024-03-15 – 2024-03-16 (×2): 25 mg via ORAL
  Filled 2024-03-15 (×2): qty 1

## 2024-03-15 MED ORDER — DOXAZOSIN MESYLATE 2 MG PO TABS
2.0000 mg | ORAL_TABLET | Freq: Every day | ORAL | Status: DC
  Administered 2024-03-15: 2 mg via ORAL
  Filled 2024-03-15: qty 1

## 2024-03-15 MED ORDER — MELATONIN 5 MG PO TABS: 5.0000 mg | ORAL_TABLET | Freq: Every evening | ORAL | Status: DC | PRN

## 2024-03-15 MED ORDER — PROCHLORPERAZINE EDISYLATE 10 MG/2ML IJ SOLN: 5.0000 mg | Freq: Four times a day (QID) | INTRAMUSCULAR | Status: DC | PRN

## 2024-03-15 NOTE — Consult Note (Signed)
 [facility] Consultation Note  Referring Provider: [facility] Primary Care Physician:  [name], [place] Primary Gastroenterologist:  [facility]  Reason for Consultation:  Elevated liver enzymes  HPI: Sean Foster is a 48 y.o. male admitted two-week history of abdominal distention, nausea, early satiety and jaundice.  Reports family history of jaundice (is originally from [place]) not otherwise specified.  He is unaware of his cirrhosis diagnosis or of having had prior liver problems.   Past Medical History:  Diagnosis Date   Diabetes mellitus without complication (HCC)    Hypertension     Past Surgical History:  Procedure Laterality Date   INCISION AND DRAINAGE ABSCESS Left 04/16/2020   Procedure: INCISION AND DRAINAGE BUTTOCK ABSCESS;  Surgeon: Oralee Billow, MD;  Location: [facility] ORS;  Service: General;  Laterality: Left;   RETINAL DETACHMENT SURGERY Right 03/08/2024    Prior to Admission medications   Medication Sig Start Date End Date Taking? Authorizing Provider  doxazosin  (CARDURA ) 2 MG tablet Take 2 mg by mouth at bedtime. 01/14/24  Yes [provider]  hydrochlorothiazide  (HYDRODIURIL ) 25 MG tablet Take 1 tablet (25 mg total) by mouth daily. 02/20/23 03/14/24 Yes Trish Furl, MD  amLODipine  (NORVASC ) 5 MG tablet Take 1 tablet (5 mg total) by mouth daily. Patient not taking: Reported on 06/16/2019 02/24/18 06/16/19  Bart Born, MD    Current Facility-Administered Medications  Medication Dose Route Frequency Provider Last Rate Last Admin   doxazosin  (CARDURA ) tablet 2 mg  2 mg Oral QHS Magdalene School, MD       hydrochlorothiazide  (HYDRODIURIL ) tablet 25 mg  25 mg Oral Daily Magdalene School, MD   25 mg at 03/15/24 1032   lactulose  (CHRONULAC ) 10 GM/15ML solution 10 g  10 g Oral Daily PRN Reesa Cannon N, DO       melatonin tablet 5 mg  5 mg Oral QHS PRN Bary Boss, DO       oxyCODONE  (Oxy IR/ROXICODONE ) immediate release  tablet 5 mg  5 mg Oral Once Henderly, Britni A, PA-C       prochlorperazine  (COMPAZINE ) injection 5 mg  5 mg Intravenous Q6H PRN Bary Boss, DO       Current Outpatient Medications  Medication Sig Dispense Refill   doxazosin  (CARDURA ) 2 MG tablet Take 2 mg by mouth at bedtime.     hydrochlorothiazide  (HYDRODIURIL ) 25 MG tablet Take 1 tablet (25 mg total) by mouth daily. 90 tablet 0    Allergies as of 03/14/2024 - Review Complete 03/14/2024  Allergen Reaction Noted   Chloroquine Itching 05/15/2019   Chlorothen Hives, Itching, and Other (See Comments) 06/25/2017    No family history on file.  Social History   Socioeconomic History   Marital status: Married    Spouse name: Not on file   Number of children: Not on file   Years of education: Not on file   Highest education level: Not on file  Occupational History   Not on file  Tobacco Use   Smoking status: Never   Smokeless tobacco: Never  Vaping Use   Vaping status: Never Used  Substance and Sexual Activity   Alcohol use: Not Currently    Comment: 1 drink per week   Drug use: Not Currently   Sexual activity: Not Currently  Other Topics Concern   Not on file  Social History Narrative   ** Merged History Encounter **       Social Drivers of Corporate investment banker  Strain: Not on file  Food Insecurity: Low Risk  (01/01/2024)   Received from [facility]   Hunger Vital Sign    Worried About Running Out of Food in the Last Year: Never true    Ran Out of Food in the Last Year: Never true  Transportation Needs: No Transportation Needs (01/01/2024)   Received from [facility]    In the past 12 months, has lack of reliable transportation kept you from medical appointments, meetings, work or from getting things needed for daily living? : No  Physical Activity: Not on file  Stress: Not on file  Social Connections: Not on file  Intimate Partner Violence: Low Risk  (12/29/2022)   [facility] visits prior to 01/25/2023., [facility] visits prior to 01/25/2023.   Safety    How often does anyone, including family and friends, physically hurt you?: Never    How often does anyone, including family and friends, insult or talk down to you?: Never    How often does anyone, including family and friends, threaten you with harm?: Never    How often does anyone, including family and friends, scream or curse at you?: Never    Review of Systems: As per HPI, all others negative  Physical Exam: Vital signs in last 24 hours: Temp:  [97.7 F (36.5 C)-98.9 F (37.2 C)] 98.5 F (36.9 C) (04/21 0852) Pulse Rate:  [54-72] 59 (04/21 0915) Resp:  [16-18] 18 (04/21 0852) BP: (156-186)/(91-105) 182/100 (04/21 1045) SpO2:  [97 %-100 %] 97 % (04/21 0915) Weight:  [102.1 kg] 102.1 kg (04/20 1356) Last BM Date : 03/15/24 General:   Alert,  overweight, older-appearing than stated age, Head:  Normocephalic and atraumatic. Eyes:  Sclera clear, no icterus.   Conjunctiva pale Ears:  Normal auditory acuity. Nose:  No deformity, discharge,  or lesions. Mouth:  No deformity or lesions.  Oropharynx pale and dry Neck:  Supple; no masses or thyromegaly. Lungs:  No visible respiratory distress Abdomen:  Soft, nontender , moderately distended No masses, hepatosplenomegaly or hernias noted. No peritonitis   Msk:  Symmetrical without gross deformities. Normal posture. Pulses:  Normal pulses noted. Extremities:  Without clubbing; scant LE edema bilaterally Neurologic:  Alert and  oriented x4;  grossly normal neurologically. Skin:  Intact without significant lesions or rashes. Psych:  Alert and cooperative. Normal mood and affect.   Lab Results: Recent Labs    03/14/24 1408 03/15/24 0525  WBC 6.4 6.3  HGB 11.2* 9.5*  HCT 32.5* 27.2*  PLT 161 135*   BMET Recent Labs    03/14/24 1408 03/15/24 0525  NA 133* 133*  K 3.7 3.7  CL 107 109  CO2 20* 21*   GLUCOSE 146* 90  BUN 17 19  CREATININE 0.96 1.16  CALCIUM 8.0* 7.8*   LFT Recent Labs    03/15/24 0525  PROT 6.3*  ALBUMIN  1.9*  AST 364*  ALT 46*  ALKPHOS 164*  BILITOT 14.7*   PT/INR Recent Labs    03/14/24 1705  LABPROT 17.8*  INR 1.4*    Studies/Results: CT ABDOMEN PELVIS W CONTRAST Result Date: 03/14/2024 CLINICAL DATA:  Hepatitis, RLQ pain. EXAM: CT ABDOMEN AND PELVIS WITH CONTRAST TECHNIQUE: Multidetector CT imaging of the abdomen and pelvis was performed using the standard protocol following bolus administration of intravenous contrast. RADIATION DOSE REDUCTION: This exam was performed according to the departmental dose-optimization program which includes automated exposure control, adjustment  of the mA and/or kV according to patient size and/or use of iterative reconstruction technique. CONTRAST:  OMNIPAQUE  IOHEXOL  300 MG/ML  SOLN COMPARISON:  02/16/2024. FINDINGS: Lower chest: Lung bases clear.  No pericardial or pleural effusions. Hepatobiliary: Nodular. The liver consistent with cirrhosis. No focal hepatic parenchymal lesions or biliary ductal dilatation. Gallbladder is distended. Pancreas: No pancreatic parenchymal abnormalities identified. There is fluid in the mesentery and retroperitoneum which is consistent with anasarca. Acute pancreatitis would not be excludable on this examination and correlation with labs is recommended. Spleen: Normal in size without focal abnormality. Adrenals/Urinary Tract: Adrenal glands are unremarkable. 2 cm cyst right kidney. No hydronephrosis or nephrolithiasis. Unremarkable urinary bladder. The cyst does not need to be followed up. Stomach/Bowel: Stomach is within normal limits. Appendix appears normal. No bowel dilatation to suggest obstruction. There is diffuse colonic wall thickening consistent with colitis. No fluid collections are seen to suggest abscesses. Vascular/Lymphatic: Aortic atherosclerosis. No enlarged abdominal or pelvic  lymph nodes. Reproductive: Prostate is unremarkable. Other: Diffuse subcutaneous edema, skin thickening, mesenteric edema and ascites consistent with anasarca. Musculoskeletal: No acute or significant osseous findings. IMPRESSION: 1. Diffuse colonic wall thickening consistent with colitis. 2. Cirrhosis. 3. Anasarca. 4. Right renal cyst. 5. Aortic atherosclerosis (ICD10-I70.0). Electronically Signed   By: Sydell Eva M.D.   On: 03/14/2024 18:38    Impression:   Hepatitis B, chronic? Acute on chronic worsening elevated liver enzymes.  Profound hyperbilirubinemia but no encephalopathy or other evidence of acute liver failure. Cirrhosis complicated by ascites/anasarca. Abnormal imaging:  "colitis," likely bowel edema from hypoalbuminemia; no symptoms of colitis at present.  Plan:   Check chronic hepatitis panel. Check other liver serologies. Follow LFTs and PT/INR daily. Low sodium diet ok. Once acute matters resolve, he will need outpatient follow-up at [facility] (his primary GI outpatient group).   LOS: 1 day   Soundra Lampley M  03/15/2024, 11:17 AM  Cell [phone] If no answer or after 5 PM call [phone]

## 2024-03-15 NOTE — Plan of Care (Signed)

## 2024-03-15 NOTE — Care Plan (Signed)
 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  Denies any recent use of antibiotics. He presented to the ER due to persistent dark urine and abdominal pain.   CT abdomen and pelvis revealed diffuse colonic wall thickening consistent with colitis, cirrhosis, anasarca, right renal cyst, aortic atherosclerosis.  Patient admitted for further evaluation.  GI is consulted.  Patient does have chronic hepatitis.  Follow LFTs and PT/INR daily.  Once acute issue resolves patient will need outpatient follow-up with [facility].  Patient was seen and examined at bedside,  reports feeling better.

## 2024-03-15 NOTE — ED Notes (Signed)
 Patient given ice chips.

## 2024-03-15 NOTE — H&P (Signed)
 History and Physical  Sean Foster ZOX:[id number] DOB: May 27, 1976 DOA: 03/14/2024  Referring physician: Dr. Aldean Amass, EDP  PCP: [name], [place]  Outpatient Specialists: Ophthalmology, general surgery, GI. Patient coming from: Home.  Chief Complaint: Abdominal pain and distention.  HPI: Sean Foster is a 48 y.o. male with medical history significant for retinal detachment followed by ophthalmology, hypertension, hyperlipidemia, type 2 diabetes (A1c 6.6 on 01/01/2024), obesity, chronic HFrEF with LVEF 40-45% (TTE 2023-outside facility), moderate to severe mitral regurgitation, cirrhosis (abdominal ultrasound 12/29/2022- outside facility), recent MELD 3.0 score of 20 on 02/26/2024, current smoker, occasional alcohol use, who presents to the ER with complaints of abdominal distention and intermittent right upper quadrant abdominal pain.  Associated with nausea and vomiting x 1 week now resolved for a few days ago.  Denies any recent use of antibiotics.  Stated he noticed his urine turning dark 2 to 3 weeks ago.  Also with intermittent watery stools.  Denies any subjective fevers or chills.  He presented to the ER due to persistent dark urine and abdominal pain.  Self medicated with Tylenol  for the past few days.  In the ER, lab work was significant for elevated liver chemistries and T. bil of 15.4, acetaminophen  level less than 10.  Contrasted CT abdomen and pelvis revealed diffuse colonic wall thickening consistent with colitis, cirrhosis, anasarca, right renal cyst, aortic atherosclerosis.  EDP discussed the case with GI who will see in consultation.  Admitted by [facility], hospitalist service.  ED Course: Temperature 97.7.  BP 160/95, pulse 59, respiration rate 16, O2 saturation 99% on room air.  Lab studies notable for serum sodium 133, serum bicarb 20, glucose 146, alkaline phosphatase 194, albumin  1.9, AST 449, ALT 60, T. bili Ruben 15.4.  Hemoglobin 11.2.  WBC 6.4, platelet count 161.  Lipase  29.  Review of Systems: Review of systems as noted in the HPI. All other systems reviewed and are negative.   Past Medical History:  Diagnosis Date   Diabetes mellitus without complication (HCC)    Hypertension    Past Surgical History:  Procedure Laterality Date   INCISION AND DRAINAGE ABSCESS Left 04/16/2020   Procedure: INCISION AND DRAINAGE BUTTOCK ABSCESS;  Surgeon: Oralee Billow, MD;  Location: [facility];  Service: General;  Laterality: Left;   RETINAL DETACHMENT SURGERY Right 03/08/2024    Social History:  reports that he has never smoked. He has never used smokeless tobacco. He reports that he does not currently use alcohol. He reports that he does not currently use drugs.   Allergies  Allergen Reactions   Chloroquine Itching   Chlorothen Hives, Itching and Other (See Comments)    "Chlorothen (trade name Forensic scientist) is an antihistamine and anticholinergic"    Family history: None reported  Prior to Admission medications   Medication Sig Start Date End Date Taking? Authorizing Provider  doxazosin  (CARDURA ) 2 MG tablet Take 2 mg by mouth at bedtime. 01/14/24  Yes [provider]  hydrochlorothiazide  (HYDRODIURIL ) 25 MG tablet Take 1 tablet (25 mg total) by mouth daily. 02/20/23 03/14/24 Yes Trish Furl, MD  Insulin  Pen Needle (PEN NEEDLES 3/16") 31G X 5 MM MISC 1 each by Does not apply route daily. 04/17/20   Marlin Simmonds, PA-C  amLODipine  (NORVASC ) 5 MG tablet Take 1 tablet (5 mg total) by mouth daily. Patient not taking: Reported on 06/16/2019 02/24/18 06/16/19  Bart Born, MD    Physical Exam: BP (!) 160/95 (BP Location: Right Arm) Comment: Simultaneous filing. User may not have seen  previous data.  Pulse (!) 59 Comment: Simultaneous filing. User may not have seen previous data.  Temp 97.7 F (36.5 C) (Oral)   Resp 16   Ht 5\' 10"  (1.778 m)   Wt 102.1 kg   SpO2 99% Comment: Simultaneous filing. User may not have seen previous data.  BMI 32.28 kg/m    General: 48 y.o. year-old male well developed well nourished in no acute distress.  Alert and oriented x3. Cardiovascular: Regular rate and rhythm with no rubs or gallops.  No thyromegaly or JVD noted.  No lower extremity edema. 2/4 pulses in all 4 extremities. Respiratory: Clear to auscultation with no wheezes or rales. Good inspiratory effort. Abdomen: Right upper quadrant tenderness moderately distended with normal bowel sounds x4 quadrants. Muskuloskeletal: No cyanosis, clubbing or edema noted bilaterally Neuro: CN II-XII intact, strength, sensation, reflexes Skin: No ulcerative lesions noted or rashes.  Jaundice. Psychiatry: Judgement and insight appear normal. Mood is appropriate for condition and setting          Labs on Admission:  Basic Metabolic Panel: Recent Labs  Lab 03/14/24 1408  NA 133*  K 3.7  CL 107  CO2 20*  GLUCOSE 146*  BUN 17  CREATININE 0.96  CALCIUM  8.0*   Liver Function Tests: Recent Labs  Lab 03/14/24 1408  AST 449*  ALT 60*  ALKPHOS 194*  BILITOT 15.4*  PROT 6.7  ALBUMIN  1.9*   Recent Labs  Lab 03/14/24 1408  LIPASE 29   No results for input(s): "AMMONIA" in the last 168 hours. CBC: Recent Labs  Lab 03/14/24 1408  WBC 6.4  HGB 11.2*  HCT 32.5*  MCV 67.4*  PLT 161   Cardiac Enzymes: No results for input(s): "CKTOTAL", "CKMB", "CKMBINDEX", "TROPONINI" in the last 168 hours.  BNP (last 3 results) No results for input(s): "BNP" in the last 8760 hours.  ProBNP (last 3 results) No results for input(s): "PROBNP" in the last 8760 hours.  CBG: Recent Labs  Lab 03/14/24 1321  GLUCAP 182*    Radiological Exams on Admission: CT ABDOMEN PELVIS W CONTRAST Result Date: 03/14/2024 CLINICAL DATA:  Hepatitis, RLQ pain. EXAM: CT ABDOMEN AND PELVIS WITH CONTRAST TECHNIQUE: Multidetector CT imaging of the abdomen and pelvis was performed using the standard protocol following bolus administration of intravenous contrast. RADIATION DOSE  REDUCTION: This exam was performed according to the departmental dose-optimization program which includes automated exposure control, adjustment of the mA and/or kV according to patient size and/or use of iterative reconstruction technique. CONTRAST:  OMNIPAQUE  IOHEXOL  300 MG/ML  SOLN COMPARISON:  02/16/2024. FINDINGS: Lower chest: Lung bases clear.  No pericardial or pleural effusions. Hepatobiliary: Nodular. The liver consistent with cirrhosis. No focal hepatic parenchymal lesions or biliary ductal dilatation. Gallbladder is distended. Pancreas: No pancreatic parenchymal abnormalities identified. There is fluid in the mesentery and retroperitoneum which is consistent with anasarca. Acute pancreatitis would not be excludable on this examination and correlation with labs is recommended. Spleen: Normal in size without focal abnormality. Adrenals/Urinary Tract: Adrenal glands are unremarkable. 2 cm cyst right kidney. No hydronephrosis or nephrolithiasis. Unremarkable urinary bladder. The cyst does not need to be followed up. Stomach/Bowel: Stomach is within normal limits. Appendix appears normal. No bowel dilatation to suggest obstruction. There is diffuse colonic wall thickening consistent with colitis. No fluid collections are seen to suggest abscesses. Vascular/Lymphatic: Aortic atherosclerosis. No enlarged abdominal or pelvic lymph nodes. Reproductive: Prostate is unremarkable. Other: Diffuse subcutaneous edema, skin thickening, mesenteric edema and ascites consistent with anasarca. Musculoskeletal:  No acute or significant osseous findings. IMPRESSION: 1. Diffuse colonic wall thickening consistent with colitis. 2. Cirrhosis. 3. Anasarca. 4. Right renal cyst. 5. Aortic atherosclerosis (ICD10-I70.0). Electronically Signed   By: Sydell Eva M.D.   On: 03/14/2024 18:38    EKG: I independently viewed the EKG done and my findings are as followed: None available at the time of this  visit.  Assessment/Plan Present on Admission:  Intractable abdominal pain  Principal Problem:   Intractable abdominal pain  Intractable abdominal pain with possible colitis seen on CT scan Endorses intermittent abdominal pain for the past 2 to 3 weeks Denies subjective fevers or chills. CT abdomen pelvis findings as stated above No recent use of antibiotics for at least a year GI consulted Continue supportive care with IV fluid, analgesics  Hyperbilirubinemia Presented with total bilirubin of 15.4, trend Repeat CMP in the morning Management per GI  Elevated liver chemistries likely secondary to cirrhosis versus others Trend LFTs Avoid hepatotoxic agents  Cirrhosis morphology seen on CT scan With anasarca Defer management to GI  Coagulopathy secondary to cirrhosis INR 1.4 No overt bleeding reported  Chronic HFrEF with LVEF 40 to 45% Monitor volume status Start strict I's and O's and daily weight  Mild non-anion gap metabolic acidosis Serum bicarb 20, anion gap 6 Monitor for now  Hypoalbuminemia in the setting of cirrhosis Albumin  1.9.  Current tobacco user Occasional alcohol use Recommend complete cessation No evidence of withdrawal at this time.  Hypertension BP is not at goal, elevated Resume home oral antihypertensives Closely monitor vital signs   Critical care time: 65 minutes.   DVT prophylaxis: SCDs.  Code Status: Full code.  Family Communication: None at bedside.  Disposition Plan: Admitted to telemetry unit  Consults called: GI consulted by EDP.  Admission status: Inpatient status.   Status is: Inpatient The patient requires at least 2 midnights for further evaluation and treatment of present condition.   Bary Boss MD [facility] Pager [phone]  If 7PM-7AM, please contact night-coverage [email or web address] Password [email or web address]  03/15/2024, 4:00 AM

## 2024-03-15 NOTE — ED Notes (Signed)
 Patient ambulated to bathroom.

## 2024-03-16 DIAGNOSIS — R109 Unspecified abdominal pain: Secondary | ICD-10-CM | POA: Diagnosis not present

## 2024-03-16 LAB — EPSTEIN-BARR VIRUS (EBV) ANTIBODY PROFILE
EBV NA IgG: >600 U/mL — ABNORMAL HIGH (ref 0.0–17.9)
EBV VCA IgG: 171 U/mL — ABNORMAL HIGH (ref 0.0–17.9)
EBV VCA IgM: <36 U/mL (ref 0.0–35.9)

## 2024-03-16 LAB — ANTI-SMOOTH MUSCLE ANTIBODY, IGG: F-Actin IgG: 12 U (ref 0–19)

## 2024-03-16 LAB — HEPATITIS B CORE ANTIBODY, TOTAL: HEP B CORE AB: POSITIVE — AB

## 2024-03-16 LAB — BASIC METABOLIC PANEL WITH GFR
Anion gap: 5 (ref 5–15)
BUN: 16 mg/dL (ref 6–20)
CO2: 21 mmol/L — ABNORMAL LOW (ref 22–32)
Calcium: 7.8 mg/dL — ABNORMAL LOW (ref 8.9–10.3)
Chloride: 105 mmol/L (ref 98–111)
Creatinine, Ser: 0.93 mg/dL (ref 0.61–1.24)
GFR, Estimated: >60 mL/min (ref >60)
Glucose, Bld: 81 mg/dL (ref 70–99)
Potassium: 3.2 mmol/L — ABNORMAL LOW (ref 3.5–5.1)
Sodium: 131 mmol/L — ABNORMAL LOW (ref 135–145)

## 2024-03-16 LAB — CBC
HCT: 27.5 % — ABNORMAL LOW (ref 39.0–52.0)
Hemoglobin: 9.8 g/dL — ABNORMAL LOW (ref 13.0–17.0)
MCH: 23.5 pg — ABNORMAL LOW (ref 26.0–34.0)
MCHC: 35.6 g/dL (ref 30.0–36.0)
MCV: 65.9 fL — ABNORMAL LOW (ref 80.0–100.0)
Platelets: 134 10*3/uL — ABNORMAL LOW (ref 150–400)
RBC: 4.17 MIL/uL — ABNORMAL LOW (ref 4.22–5.81)
RDW: 21.6 % — ABNORMAL HIGH (ref 11.5–15.5)
WBC: 5.5 10*3/uL (ref 4.0–10.5)
nRBC: 0 % (ref 0.0–0.2)

## 2024-03-16 LAB — PHOSPHORUS: Phosphorus: 2.8 mg/dL (ref 2.5–4.6)

## 2024-03-16 LAB — ANA W/REFLEX IF POSITIVE: Anti Nuclear Antibody (ANA): NEGATIVE

## 2024-03-16 LAB — HEPATITIS PANEL, ACUTE
HCV Ab: NONREACTIVE
Hep A IgM: NONREACTIVE
Hep B C IgM: NONREACTIVE
Hepatitis B Surface Ag: REACTIVE — AB

## 2024-03-16 LAB — HEPATITIS B E ANTIGEN: Hep B E Ag: NEGATIVE

## 2024-03-16 LAB — CMV IGM: CMV IgM: <30 [AU]/ml (ref 0.0–29.9)

## 2024-03-16 LAB — MAGNESIUM: Magnesium: 1.9 mg/dL (ref 1.7–2.4)

## 2024-03-16 MED ORDER — LACTULOSE 10 GM/15ML PO SOLN: 10.0000 g | Freq: Every day | ORAL | 0 refills | Status: DC | PRN

## 2024-03-16 MED ORDER — POTASSIUM CHLORIDE 20 MEQ PO PACK
40.0000 meq | PACK | Freq: Once | ORAL | Status: AC
Start: 2024-03-16 — End: 2024-03-16
  Administered 2024-03-16: 40 meq via ORAL
  Filled 2024-03-16: qty 2

## 2024-03-16 NOTE — Discharge Summary (Signed)
 Physician Discharge Summary  Sean Foster ZOX:[id number] DOB: [birth year] DOA: 03/14/2024  PCP: [name], PA-C  Admit date: 03/14/2024  Discharge date: 03/16/2024  Admitted From: Home  Disposition:  Home  Recommendations for Outpatient Follow-up:  Follow up with PCP in 1-2 weeks. Please obtain BMP/CBC in one week Advised to follow-up with [facility] for liver transplant and further management.  Home Health: None Equipment/Devices:None  Discharge Condition: Stable CODE STATUS:Full code Diet recommendation: Heart Healthy   Brief [facility] Course: This 48 yrs old male with medical history significant for retinal detachment followed by ophthalmology, hypertension, hyperlipidemia, type 2 diabetes (Hb A1c 6.6 on 01/01/2024), obesity, chronic HFrEF with LVEF 40-45% (TTE 2023-outside facility), moderate to severe mitral regurgitation, cirrhosis (abdominal ultrasound 12/29/2022- outside facility), recent MELD 3.0 score of 20 on 02/26/2024, current smoker, occasional alcohol use, who presents to the ER with complaints of abdominal distention and intermittent right upper quadrant abdominal pain associated with nausea and vomiting x 1 week now resolved a few days ago.  Denies any recent use of antibiotics. He presented to the ER due to persistent dark urine and abdominal pain.   CT abdomen and pelvis revealed diffuse colonic wall thickening consistent with colitis, cirrhosis, anasarca, right renal cyst, aortic atherosclerosis.  Patient was admitted for further evaluation.  GI was consulted.  Patient does have chronic hepatitis B. LFTs and INR are improving.  Follow LFTs and PT/INR daily.  Once acute issue resolves,  Patient will need outpatient follow-up with [facility].  Patient reports feeling much better,  GI signed off recommended patient should follow-up with [facility] for further management.  He might need treatment for hepatitis B.  Patient is being discharged  home.   Discharge Diagnoses:  Principal Problem:   Intractable abdominal pain   Discharge Instructions: Advised to follow-up with [facility] for further management.  Discharge Instructions     Call MD for:  difficulty breathing, headache or visual disturbances   Complete by: As directed    Call MD for:  persistant dizziness or light-headedness   Complete by: As directed    Call MD for:  persistant nausea and vomiting   Complete by: As directed    Diet - low sodium heart healthy   Complete by: As directed    Diet general   Complete by: As directed    Discharge instructions   Complete by: As directed    Advised to follow-up with primary care physician in 1 week. Advised to follow-up with [facility] for liver transplant and further management. Advised to follow-up with infectious diseases for treatment for chronic hepatitis B.   Increase activity slowly   Complete by: As directed       Allergies as of 03/16/2024       Reactions   Chloroquine Itching   Chlorothen Hives, Itching, Other (See Comments)   "Chlorothen (trade name Forensic scientist) is an antihistamine and anticholinergic"        Medication List     TAKE these medications    doxazosin  2 MG tablet Commonly known as: CARDURA  Take 2 mg by mouth at bedtime.   hydrochlorothiazide  25 MG tablet Commonly known as: HYDRODIURIL  Take 1 tablet (25 mg total) by mouth daily.   lactulose  10 GM/15ML solution Commonly known as: CHRONULAC  Take 15 mLs (10 g total) by mouth daily as needed for mild constipation.        Follow-up Information     [name], PA-C Follow up in 1  week(s).   Specialty: Physician Assistant Contact information: [address] [place] [address] [phone]         Jamesetta Mcbride, MD Follow up in 1 week(s).   Specialty: Infectious Diseases Contact information: [address] [place] [address] [phone]                Allergies   Allergen Reactions   Chloroquine Itching   Chlorothen Hives, Itching and Other (See Comments)    "Chlorothen (trade name Forensic scientist) is an antihistamine and anticholinergic"    Consultations: Gastroenterology   Procedures/Studies: CT ABDOMEN PELVIS W CONTRAST Result Date: 03/14/2024 CLINICAL DATA:  Hepatitis, RLQ pain. EXAM: CT ABDOMEN AND PELVIS WITH CONTRAST TECHNIQUE: Multidetector CT imaging of the abdomen and pelvis was performed using the standard protocol following bolus administration of intravenous contrast. RADIATION DOSE REDUCTION: This exam was performed according to the departmental dose-optimization program which includes automated exposure control, adjustment of the mA and/or kV according to patient size and/or use of iterative reconstruction technique. CONTRAST:  OMNIPAQUE  IOHEXOL  300 MG/ML  SOLN COMPARISON:  02/16/2024. FINDINGS: Lower chest: Lung bases clear.  No pericardial or pleural effusions. Hepatobiliary: Nodular. The liver consistent with cirrhosis. No focal hepatic parenchymal lesions or biliary ductal dilatation. Gallbladder is distended. Pancreas: No pancreatic parenchymal abnormalities identified. There is fluid in the mesentery and retroperitoneum which is consistent with anasarca. Acute pancreatitis would not be excludable on this examination and correlation with labs is recommended. Spleen: Normal in size without focal abnormality. Adrenals/Urinary Tract: Adrenal glands are unremarkable. 2 cm cyst right kidney. No hydronephrosis or nephrolithiasis. Unremarkable urinary bladder. The cyst does not need to be followed up. Stomach/Bowel: Stomach is within normal limits. Appendix appears normal. No bowel dilatation to suggest obstruction. There is diffuse colonic wall thickening consistent with colitis. No fluid collections are seen to suggest abscesses. Vascular/Lymphatic: Aortic atherosclerosis. No enlarged abdominal or pelvic lymph nodes. Reproductive: Prostate is  unremarkable. Other: Diffuse subcutaneous edema, skin thickening, mesenteric edema and ascites consistent with anasarca. Musculoskeletal: No acute or significant osseous findings. IMPRESSION: 1. Diffuse colonic wall thickening consistent with colitis. 2. Cirrhosis. 3. Anasarca. 4. Right renal cyst. 5. Aortic atherosclerosis (ICD10-I70.0). Electronically Signed   By: Sydell Eva M.D.   On: 03/14/2024 18:38   MR Brain W and Wo Contrast Result Date: 02/17/2024 CLINICAL DATA:  Headache, increasing frequency or severity. Mental status change, unknown cause eval for PRES EXAM: MRI HEAD WITHOUT AND WITH CONTRAST TECHNIQUE: Multiplanar, multiecho pulse sequences of the brain and surrounding structures were obtained without and with intravenous contrast. CONTRAST:  10mL GADAVIST  GADOBUTROL  1 MMOL/ML IV SOLN COMPARISON:  CT head February 16, 2024. MRI head January 01, 2024. FINDINGS: Brain: No acute infarction, hemorrhage, hydrocephalus, extra-axial collection or mass lesion. Multifocal T2/FLAIR hyperintensity in the white matter, mildly advanced for patient age. Vascular: Major arterial flow voids are maintained at the skull base. Skull and upper cervical spine: Normal marrow signal. Sinuses/Orbits: Maxillary sinus retention cysts. No acute orbital findings. Other: No sizable mastoid effusions. IMPRESSION: 1. No evidence of acute intracranial abnormality. 2. Similar multifocal T2/FLAIR hyperintensity in the white matter, mildly advanced for patient age. This most likely represents accelerated chronic microvascular ischemic change, although chronic demyelination is a differential consideration. Electronically Signed   By: Stevenson Elbe M.D.   On: 02/17/2024 01:34   CT ABDOMEN PELVIS W CONTRAST Result Date: 02/17/2024 CLINICAL DATA:  Acute nonlocalized abdominal pain. Vomiting. Full body shaking. EXAM: CT ABDOMEN  AND PELVIS WITH CONTRAST TECHNIQUE: Multidetector CT imaging of the abdomen and pelvis was performed  using the standard protocol following bolus administration of intravenous contrast. RADIATION DOSE REDUCTION: This exam was performed according to the departmental dose-optimization program which includes automated exposure control, adjustment of the mA and/or kV according to patient size and/or use of iterative reconstruction technique. CONTRAST:  75mL OMNIPAQUE  IOHEXOL  350 MG/ML SOLN COMPARISON:  CT abdomen and pelvis 12/29/2022. Right upper quadrant ultrasound 12/29/2022. FINDINGS: Lower chest: Lung bases are clear.  Cardiac enlargement. Hepatobiliary: Nodular liver contour suggests cirrhosis. No focal lesions. Gallbladder is not identified, gallbladder is mildly distended. No stones or wall thickening identified. No bile duct dilatation. Pancreas: Unremarkable. No pancreatic ductal dilatation or surrounding inflammatory changes. Spleen: Normal in size without focal abnormality. Adrenals/Urinary Tract: Adrenal glands are unremarkable. Kidneys are normal, without renal calculi, focal lesion, or hydronephrosis. Bladder is unremarkable. Stomach/Bowel: Persistent gastric wall thickening similar to prior study, possibly gastritis. Small bowel and colon are mostly decompressed. Appendix is normal. Vascular/Lymphatic: Aortic atherosclerosis. No enlarged abdominal or pelvic lymph nodes. Reproductive: Prostate is unremarkable. Other: Trace free fluid along the liver edge. No free air in the abdomen. Small periumbilical hernia containing small bowel. No proximal obstruction. No change. Musculoskeletal: No acute or significant osseous findings. IMPRESSION: 1. Diffuse gastric wall thickening likely indicating gastritis. 2. Hepatic cirrhosis without focal lesion. Trace perihepatic ascites. 3. Mild aortic atherosclerosis. Electronically Signed   By: Boyce Byes M.D.   On: 02/17/2024 00:27   CT HEAD WO CONTRAST Result Date: 02/16/2024 CLINICAL DATA:  Seizure, new-onset, no history of trauma EXAM: CT HEAD WITHOUT  CONTRAST TECHNIQUE: Contiguous axial images were obtained from the base of the skull through the vertex without intravenous contrast. RADIATION DOSE REDUCTION: This exam was performed according to the departmental dose-optimization program which includes automated exposure control, adjustment of the mA and/or kV according to patient size and/or use of iterative reconstruction technique. COMPARISON:  CT head 12/13/2021 FINDINGS: Brain: No evidence of large-territorial acute infarction. No parenchymal hemorrhage. No mass lesion. No extra-axial collection. No mass effect or midline shift. No hydrocephalus. Basilar cisterns are patent. Vascular: No hyperdense vessel. Skull: No acute fracture or focal lesion. Sinuses/Orbits: Bilateral maxillary sinus polypoid-like mucosal thickening. Paranasal sinuses and mastoid air cells are clear. The orbits are unremarkable. Other: None. IMPRESSION: No acute intracranial abnormality. Electronically Signed   By: Morgane  Naveau M.D.   On: 02/16/2024 21:02    Subjective: Patient was seen and examined at bedside.  Overnight events noted.   Patient reports doing much better, abdominal pain has resolved.  Nausea and vomiting improved.  He wants to be discharged Home.Aaron Aas  Discharge Exam: Vitals:   03/16/24 0125 03/16/24 0435  BP: (!) 145/82 (!) 161/87  Pulse: 61 65  Resp: 17 18  Temp: 98.6 F (37 C) 98.6 F (37 C)  SpO2: 100% 99%   Vitals:   03/15/24 1745 03/15/24 2046 03/16/24 0125 03/16/24 0435  BP: (!) 174/96 (!) 162/99 (!) 145/82 (!) 161/87  Pulse: 67 72 61 65  Resp: 18 18 17 18   Temp: 99.2 F (37.3 C) 98.3 F (36.8 C) 98.6 F (37 C) 98.6 F (37 C)  TempSrc: Oral Oral    SpO2: 100% 100% 100% 99%  Weight:      Height:        General: Pt is alert, awake, not in acute distress Cardiovascular: RRR, S1/S2 +, no rubs, no gallops Respiratory: CTA bilaterally, no wheezing, no rhonchi Abdominal: Soft, NT,  ND, bowel sounds + Extremities: no edema, no  cyanosis    The results of significant diagnostics from this hospitalization (including imaging, microbiology, ancillary and laboratory) are listed below for reference.     Microbiology: No results found for this or any previous visit (from the past 240 hours).   Labs: BNP (last 3 results) No results for input(s): "BNP" in the last 8760 hours. Basic Metabolic Panel: Recent Labs  Lab 03/14/24 1408 03/15/24 0525 03/16/24 0551  NA 133* 133* 131*  K 3.7 3.7 3.2*  CL 107 109 105  CO2 20* 21* 21*  GLUCOSE 146* 90 81  BUN 17 19 16   CREATININE 0.96 1.16 0.93  CALCIUM 8.0* 7.8* 7.8*  MG  --  1.9 1.9  PHOS  --  2.6 2.8   Liver Function Tests: Recent Labs  Lab 03/14/24 1408 03/15/24 0525  AST 449* 364*  ALT 60* 46*  ALKPHOS 194* 164*  BILITOT 15.4* 14.7*  PROT 6.7 6.3*  ALBUMIN  1.9* 1.9*   Recent Labs  Lab 03/14/24 1408  LIPASE 29   No results for input(s): "AMMONIA" in the last 168 hours. CBC: Recent Labs  Lab 03/14/24 1408 03/15/24 0525 03/16/24 0551  WBC 6.4 6.3 5.5  HGB 11.2* 9.5* 9.8*  HCT 32.5* 27.2* 27.5*  MCV 67.4* 67.2* 65.9*  PLT 161 135* 134*   Cardiac Enzymes: No results for input(s): "CKTOTAL", "CKMB", "CKMBINDEX", "TROPONINI" in the last 168 hours. BNP: Invalid input(s): "POCBNP" CBG: Recent Labs  Lab 03/14/24 1321 03/15/24 1648  GLUCAP 182* 116*   D-Dimer No results for input(s): "DDIMER" in the last 72 hours. Hgb A1c No results for input(s): "HGBA1C" in the last 72 hours. Lipid Profile No results for input(s): "CHOL", "HDL", "LDLCALC", "TRIG", "CHOLHDL", "LDLDIRECT" in the last 72 hours. Thyroid function studies No results for input(s): "TSH", "T4TOTAL", "T3FREE", "THYROIDAB" in the last 72 hours.  Invalid input(s): "FREET3" Anemia work up Recent Labs    03/15/24 1558  FERRITIN 1,069*  TIBC 157*  IRON 144   Urinalysis    Component Value Date/Time   COLORURINE AMBER (A) 03/14/2024 1653   APPEARANCEUR CLEAR 03/14/2024  1653   LABSPEC 1.027 03/14/2024 1653   PHURINE 5.0 03/14/2024 1653   GLUCOSEU NEGATIVE 03/14/2024 1653   HGBUR SMALL (A) 03/14/2024 1653   BILIRUBINUR MODERATE (A) 03/14/2024 1653   KETONESUR NEGATIVE 03/14/2024 1653   PROTEINUR >=300 (A) 03/14/2024 1653   NITRITE NEGATIVE 03/14/2024 1653   LEUKOCYTESUR NEGATIVE 03/14/2024 1653   Sepsis Labs Recent Labs  Lab 03/14/24 1408 03/15/24 0525 03/16/24 0551  WBC 6.4 6.3 5.5   Microbiology No results found for this or any previous visit (from the past 240 hours).   Time coordinating discharge: Over 30 minutes  SIGNED:   Magdalene School, MD  [facility] 03/16/2024, 2:52 PM Pager   If 7PM-7AM, please contact night-coverage

## 2024-03-16 NOTE — Plan of Care (Signed)
  Problem: Education: Goal: Knowledge of General Education information will improve Description: Including pain rating scale, medication(s)/side effects and non-pharmacologic comfort measures 03/16/2024 1539 by Roslynn Coombes, RN Outcome: Adequate for Discharge 03/16/2024 1254 by Roslynn Coombes, RN Outcome: Progressing   Problem: Health Behavior/Discharge Planning: Goal: Ability to manage health-related needs will improve 03/16/2024 1539 by Roslynn Coombes, RN Outcome: Adequate for Discharge 03/16/2024 1254 by Roslynn Coombes, RN Outcome: Progressing   Problem: Clinical Measurements: Goal: Ability to maintain clinical measurements within normal limits will improve 03/16/2024 1539 by Roslynn Coombes, RN Outcome: Adequate for Discharge 03/16/2024 1254 by Roslynn Coombes, RN Outcome: Progressing Goal: Will remain free from infection 03/16/2024 1539 by Roslynn Coombes, RN Outcome: Adequate for Discharge 03/16/2024 1254 by Roslynn Coombes, RN Outcome: Progressing Goal: Diagnostic test results will improve 03/16/2024 1539 by Roslynn Coombes, RN Outcome: Adequate for Discharge 03/16/2024 1254 by Roslynn Coombes, RN Outcome: Progressing Goal: Respiratory complications will improve 03/16/2024 1539 by Roslynn Coombes, RN Outcome: Adequate for Discharge 03/16/2024 1254 by Roslynn Coombes, RN Outcome: Progressing Goal: Cardiovascular complication will be avoided 03/16/2024 1539 by Roslynn Coombes, RN Outcome: Adequate for Discharge 03/16/2024 1254 by Roslynn Coombes, RN Outcome: Progressing   Problem: Activity: Goal: Risk for activity intolerance will decrease 03/16/2024 1539 by Roslynn Coombes, RN Outcome: Adequate for Discharge 03/16/2024 1254 by Roslynn Coombes, RN Outcome: Progressing   Problem: Nutrition: Goal: Adequate nutrition will be maintained 03/16/2024 1539 by Roslynn Coombes, RN Outcome: Adequate for Discharge 03/16/2024 1254 by Roslynn Coombes, RN Outcome: Progressing    Problem: Elimination: Goal: Will not experience complications related to bowel motility 03/16/2024 1539 by Roslynn Coombes, RN Outcome: Adequate for Discharge 03/16/2024 1254 by Roslynn Coombes, RN Outcome: Progressing Goal: Will not experience complications related to urinary retention 03/16/2024 1539 by Roslynn Coombes, RN Outcome: Adequate for Discharge 03/16/2024 1254 by Roslynn Coombes, RN Outcome: Progressing   Problem: Pain Managment: Goal: General experience of comfort will improve and/or be controlled 03/16/2024 1539 by Roslynn Coombes, RN Outcome: Adequate for Discharge 03/16/2024 1254 by Roslynn Coombes, RN Outcome: Progressing   Problem: Skin Integrity: Goal: Risk for impaired skin integrity will decrease 03/16/2024 1539 by Roslynn Coombes, RN Outcome: Adequate for Discharge 03/16/2024 1254 by Roslynn Coombes, RN Outcome: Progressing   Problem: Safety: Goal: Ability to remain free from injury will improve 03/16/2024 1539 by Roslynn Coombes, RN Outcome: Adequate for Discharge 03/16/2024 1254 by Roslynn Coombes, RN Outcome: Progressing

## 2024-03-16 NOTE — Progress Notes (Signed)
   03/16/24 1024  TOC Brief Assessment  Insurance and Status Reviewed  Patient has primary care physician Yes  Home environment has been reviewed Single family home  Prior level of function: Independent  Prior/Current Home Services No current home services  Social Drivers of Health Review SDOH reviewed no interventions necessary  Readmission risk has been reviewed Yes (15%)  Transition of care needs no transition of care needs at this time

## 2024-03-16 NOTE — Progress Notes (Signed)
 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

## 2024-03-16 NOTE — Plan of Care (Signed)

## 2024-03-16 NOTE — Plan of Care (Signed)
  Problem: Education: Goal: Knowledge of General Education information will improve Description: Including pain rating scale, medication(s)/side effects and non-pharmacologic comfort measures Outcome: Progressing   Problem: Health Behavior/Discharge Planning: Goal: Ability to manage health-related needs will improve Outcome: Progressing   Problem: Clinical Measurements: Goal: Ability to maintain clinical measurements within normal limits will improve Outcome: Progressing Goal: Will remain free from infection Outcome: Progressing Goal: Diagnostic test results will improve Outcome: Progressing Goal: Respiratory complications will improve Outcome: Progressing Goal: Cardiovascular complication will be avoided Outcome: Progressing   Problem: Nutrition: Goal: Adequate nutrition will be maintained Outcome: Progressing   Problem: Activity: Goal: Risk for activity intolerance will decrease Outcome: Progressing   Problem: Coping: Goal: Level of anxiety will decrease Outcome: Progressing   Problem: Pain Managment: Goal: General experience of comfort will improve and/or be controlled Outcome: Progressing   Problem: Safety: Goal: Ability to remain free from injury will improve Outcome: Progressing   Problem: Skin Integrity: Goal: Risk for impaired skin integrity will decrease Outcome: Progressing

## 2024-03-16 NOTE — Discharge Instructions (Signed)
 Advised to follow-up with primary care physician in 1 week. Advised to follow-up with [facility] for liver transplant and further management. Advised to follow-up with infectious diseases for treatment for chronic hepatitis B.

## 2024-03-20 ENCOUNTER — Inpatient Hospital Stay (HOSPITAL_COMMUNITY)
Admission: EM | Admit: 2024-03-20 | Discharge: 2024-03-23 | DRG: 442 | Disposition: A | Discharge status: Home / Self Care | Attending: Internal Medicine | Admitting: Internal Medicine

## 2024-03-20 ENCOUNTER — Emergency Department (HOSPITAL_COMMUNITY)

## 2024-03-20 ENCOUNTER — Encounter (HOSPITAL_COMMUNITY): Payer: Self-pay | Admitting: Emergency Medicine

## 2024-03-20 ENCOUNTER — Other Ambulatory Visit: Payer: Self-pay

## 2024-03-20 DIAGNOSIS — B181 Chronic viral hepatitis B without delta-agent: Secondary | ICD-10-CM | POA: Diagnosis present

## 2024-03-20 DIAGNOSIS — N179 Acute kidney failure, unspecified: Secondary | ICD-10-CM | POA: Diagnosis not present

## 2024-03-20 DIAGNOSIS — E66811 Obesity, class 1: Secondary | ICD-10-CM | POA: Diagnosis present

## 2024-03-20 DIAGNOSIS — K7200 Acute and subacute hepatic failure without coma: Secondary | ICD-10-CM | POA: Diagnosis present

## 2024-03-20 DIAGNOSIS — Z7984 Long term (current) use of oral hypoglycemic drugs: Secondary | ICD-10-CM | POA: Diagnosis not present

## 2024-03-20 DIAGNOSIS — I110 Hypertensive heart disease with heart failure: Secondary | ICD-10-CM | POA: Diagnosis present

## 2024-03-20 DIAGNOSIS — E1165 Type 2 diabetes mellitus with hyperglycemia: Secondary | ICD-10-CM | POA: Diagnosis present

## 2024-03-20 DIAGNOSIS — R569 Unspecified convulsions: Secondary | ICD-10-CM | POA: Diagnosis not present

## 2024-03-20 DIAGNOSIS — K766 Portal hypertension: Secondary | ICD-10-CM | POA: Diagnosis present

## 2024-03-20 DIAGNOSIS — R188 Other ascites: Secondary | ICD-10-CM | POA: Diagnosis present

## 2024-03-20 DIAGNOSIS — H3320 Serous retinal detachment, unspecified eye: Secondary | ICD-10-CM | POA: Diagnosis present

## 2024-03-20 DIAGNOSIS — I10 Essential (primary) hypertension: Secondary | ICD-10-CM | POA: Diagnosis present

## 2024-03-20 DIAGNOSIS — Z79899 Other long term (current) drug therapy: Secondary | ICD-10-CM | POA: Diagnosis not present

## 2024-03-20 DIAGNOSIS — I5022 Chronic systolic (congestive) heart failure: Secondary | ICD-10-CM | POA: Diagnosis present

## 2024-03-20 DIAGNOSIS — R0602 Shortness of breath: Secondary | ICD-10-CM | POA: Diagnosis not present

## 2024-03-20 DIAGNOSIS — H3321 Serous retinal detachment, right eye: Secondary | ICD-10-CM | POA: Diagnosis present

## 2024-03-20 DIAGNOSIS — E119 Type 2 diabetes mellitus without complications: Secondary | ICD-10-CM

## 2024-03-20 DIAGNOSIS — K7469 Other cirrhosis of liver: Secondary | ICD-10-CM | POA: Diagnosis present

## 2024-03-20 DIAGNOSIS — E872 Acidosis, unspecified: Secondary | ICD-10-CM | POA: Diagnosis present

## 2024-03-20 DIAGNOSIS — Z6832 Body mass index (BMI) 32.0-32.9, adult: Secondary | ICD-10-CM

## 2024-03-20 DIAGNOSIS — E869 Volume depletion, unspecified: Secondary | ICD-10-CM | POA: Diagnosis present

## 2024-03-20 DIAGNOSIS — K767 Hepatorenal syndrome: Secondary | ICD-10-CM

## 2024-03-20 DIAGNOSIS — E785 Hyperlipidemia, unspecified: Secondary | ICD-10-CM | POA: Diagnosis present

## 2024-03-20 DIAGNOSIS — I340 Nonrheumatic mitral (valve) insufficiency: Secondary | ICD-10-CM | POA: Diagnosis present

## 2024-03-20 LAB — URINALYSIS, ROUTINE W REFLEX MICROSCOPIC
Bacteria, UA: NONE SEEN
Glucose, UA: NEGATIVE mg/dL
Ketones, ur: NEGATIVE mg/dL
Leukocytes,Ua: NEGATIVE
Nitrite: NEGATIVE
Protein, ur: >=300 mg/dL — AB
Specific Gravity, Urine: 1.029 (ref 1.005–1.030)
pH: 5 (ref 5.0–8.0)

## 2024-03-20 LAB — CBC WITH DIFFERENTIAL/PLATELET
Abs Immature Granulocytes: 0.03 10*3/uL (ref 0.00–0.07)
Basophils Absolute: 0 10*3/uL (ref 0.0–0.1)
Basophils Relative: 1 %
Eosinophils Absolute: 0.1 10*3/uL (ref 0.0–0.5)
Eosinophils Relative: 2 %
HCT: 29.9 % — ABNORMAL LOW (ref 39.0–52.0)
Hemoglobin: 10.4 g/dL — ABNORMAL LOW (ref 13.0–17.0)
Immature Granulocytes: 1 %
Lymphocytes Relative: 41 %
Lymphs Abs: 2.2 10*3/uL (ref 0.7–4.0)
MCH: 23.2 pg — ABNORMAL LOW (ref 26.0–34.0)
MCHC: 34.8 g/dL (ref 30.0–36.0)
MCV: 66.6 fL — ABNORMAL LOW (ref 80.0–100.0)
Monocytes Absolute: 0.5 10*3/uL (ref 0.1–1.0)
Monocytes Relative: 10 %
Neutro Abs: 2.5 10*3/uL (ref 1.7–7.7)
Neutrophils Relative %: 45 %
Platelets: 159 10*3/uL (ref 150–400)
RBC: 4.49 MIL/uL (ref 4.22–5.81)
RDW: 23.4 % — ABNORMAL HIGH (ref 11.5–15.5)
Smear Review: NORMAL
WBC: 5.4 10*3/uL (ref 4.0–10.5)
nRBC: 0 % (ref 0.0–0.2)

## 2024-03-20 LAB — BRAIN NATRIURETIC PEPTIDE: B Natriuretic Peptide: 138.1 pg/mL — ABNORMAL HIGH (ref 0.0–100.0)

## 2024-03-20 LAB — COMPREHENSIVE METABOLIC PANEL WITH GFR
ALT: 58 U/L — ABNORMAL HIGH (ref 0–44)
AST: 427 U/L — ABNORMAL HIGH (ref 15–41)
Albumin: 1.9 g/dL — ABNORMAL LOW (ref 3.5–5.0)
Alkaline Phosphatase: 149 U/L — ABNORMAL HIGH (ref 38–126)
Anion gap: 8 (ref 5–15)
BUN: 22 mg/dL — ABNORMAL HIGH (ref 6–20)
CO2: 18 mmol/L — ABNORMAL LOW (ref 22–32)
Calcium: 8.3 mg/dL — ABNORMAL LOW (ref 8.9–10.3)
Chloride: 107 mmol/L (ref 98–111)
Creatinine, Ser: 1.38 mg/dL — ABNORMAL HIGH (ref 0.61–1.24)
GFR, Estimated: >60 mL/min (ref >60)
Glucose, Bld: 142 mg/dL — ABNORMAL HIGH (ref 70–99)
Potassium: 3.4 mmol/L — ABNORMAL LOW (ref 3.5–5.1)
Sodium: 133 mmol/L — ABNORMAL LOW (ref 135–145)
Total Bilirubin: 20.1 mg/dL (ref 0.0–1.2)
Total Protein: 7.1 g/dL (ref 6.5–8.1)

## 2024-03-20 LAB — PROTIME-INR
INR: 1.6 — ABNORMAL HIGH (ref 0.8–1.2)
Prothrombin Time: 18.9 s — ABNORMAL HIGH (ref 11.4–15.2)

## 2024-03-20 LAB — MAGNESIUM: Magnesium: 2 mg/dL (ref 1.7–2.4)

## 2024-03-20 LAB — GLUCOSE, CAPILLARY: Glucose-Capillary: 372 mg/dL — ABNORMAL HIGH (ref 70–99)

## 2024-03-20 LAB — IMMUNOGLOBULINS A/E/G/M, SERUM
IgA: 348 mg/dL (ref 90–386)
IgE (Immunoglobulin E), Serum: 36 [IU]/mL (ref 6–495)
IgG (Immunoglobin G), Serum: 2702 mg/dL — ABNORMAL HIGH (ref 603–1613)
IgM (Immunoglobulin M), Srm: 96 mg/dL (ref 20–172)

## 2024-03-20 LAB — ETHANOL: Alcohol, Ethyl (B): <15 mg/dL (ref <15)

## 2024-03-20 LAB — AMMONIA: Ammonia: 42 umol/L — ABNORMAL HIGH (ref 9–35)

## 2024-03-20 LAB — CBG MONITORING, ED: Glucose-Capillary: 128 mg/dL — ABNORMAL HIGH (ref 70–99)

## 2024-03-20 MED ORDER — DOXAZOSIN MESYLATE 2 MG PO TABS
2.0000 mg | ORAL_TABLET | Freq: Every day | ORAL | Status: DC
  Administered 2024-03-20 – 2024-03-22 (×3): 2 mg via ORAL
  Filled 2024-03-20 (×4): qty 1

## 2024-03-20 MED ORDER — INSULIN ASPART 100 UNIT/ML IJ SOLN
0.0000 [IU] | Freq: Three times a day (TID) | INTRAMUSCULAR | Status: DC
  Administered 2024-03-21: 3 [IU] via SUBCUTANEOUS
  Administered 2024-03-21: 2 [IU] via SUBCUTANEOUS
  Administered 2024-03-21: 3 [IU] via SUBCUTANEOUS
  Administered 2024-03-22 (×2): 2 [IU] via SUBCUTANEOUS
  Administered 2024-03-22: 3 [IU] via SUBCUTANEOUS

## 2024-03-20 MED ORDER — ONDANSETRON HCL 4 MG PO TABS: 4.0000 mg | ORAL_TABLET | Freq: Four times a day (QID) | ORAL | Status: DC | PRN

## 2024-03-20 MED ORDER — METHYLPREDNISOLONE SODIUM SUCC 125 MG IJ SOLR
125.0000 mg | INTRAMUSCULAR | Status: AC
  Administered 2024-03-20: 125 mg via INTRAVENOUS
  Filled 2024-03-20: qty 2

## 2024-03-20 MED ORDER — ONDANSETRON HCL 4 MG/2ML IJ SOLN: 4.0000 mg | Freq: Four times a day (QID) | INTRAMUSCULAR | Status: DC | PRN

## 2024-03-20 MED ORDER — OXYCODONE HCL 5 MG PO TABS
5.0000 mg | ORAL_TABLET | ORAL | Status: DC | PRN
  Administered 2024-03-21 – 2024-03-22 (×3): 5 mg via ORAL
  Filled 2024-03-20 (×3): qty 1

## 2024-03-20 MED ORDER — GADOBUTROL 1 MMOL/ML IV SOLN
10.0000 mL | Freq: Once | INTRAVENOUS | Status: AC | PRN
Start: 2024-03-20 — End: 2024-03-20
  Administered 2024-03-20: 10 mL via INTRAVENOUS

## 2024-03-20 MED ORDER — HYDROCHLOROTHIAZIDE 25 MG PO TABS
25.0000 mg | ORAL_TABLET | Freq: Every day | ORAL | Status: DC
  Administered 2024-03-20 – 2024-03-21 (×2): 25 mg via ORAL
  Filled 2024-03-20 (×2): qty 1

## 2024-03-20 MED ORDER — LACTULOSE 10 GM/15ML PO SOLN
20.0000 g | Freq: Two times a day (BID) | ORAL | Status: DC
  Administered 2024-03-20 – 2024-03-21 (×3): 20 g via ORAL
  Filled 2024-03-20 (×3): qty 30

## 2024-03-20 MED ORDER — IPRATROPIUM-ALBUTEROL 0.5-2.5 (3) MG/3ML IN SOLN
6.0000 mL | Freq: Once | RESPIRATORY_TRACT | Status: AC
  Administered 2024-03-20: 6 mL via RESPIRATORY_TRACT
  Filled 2024-03-20: qty 6

## 2024-03-20 MED ORDER — INSULIN ASPART 100 UNIT/ML IJ SOLN
0.0000 [IU] | Freq: Every day | INTRAMUSCULAR | Status: DC
  Administered 2024-03-20: 5 [IU] via SUBCUTANEOUS
  Administered 2024-03-21: 2 [IU] via SUBCUTANEOUS
  Administered 2024-03-22: 4 [IU] via SUBCUTANEOUS

## 2024-03-20 NOTE — Progress Notes (Signed)
 Admission Notes:  20:10H Received patient from ED via stretcher. Accompanied by transport service. On room air. Alert and oriented.  Safety precautions initiated. Side rails up, bed wheels locked and call bell within reached. Orientation to room and room set-up done. Hooked to telebox# [phone]

## 2024-03-20 NOTE — ED Notes (Signed)
 Patient transported to MRI

## 2024-03-20 NOTE — ED Triage Notes (Signed)
 Pt arrives via EMS from home with reports of witnessed seizure by family that lasted about a minute. Pt denies hx of seizures but was seen here in March for seizure like activity.  Pt worried about abd distention and recently admitted for cirrhosis.

## 2024-03-20 NOTE — Progress Notes (Signed)
 Went to attempt EEG.  Nurse advised that patient was transported to MRI.  Will call back in 1 hour.

## 2024-03-20 NOTE — ED Provider Notes (Signed)
 [place] EMERGENCY DEPARTMENT AT [facility] Provider Note   CSN: [id number] Arrival date & time: 03/20/24  1307     History  Chief Complaint  Patient presents with   Seizures    Sean Foster is a 48 y.o. male.  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  History obtained per the patient and son.  Reports that he was at home and was calling 911 due to shortness of breath from his abdominal swelling which has been getting worse over the past few weeks.  When they were on the way his son reports that he had approximately five 1 minute episodes of generalized clonic shaking.  Has a history of 1 other seizure but is not yet on antiepileptics.  No fevers or chills.  No headache.  No falls or head trauma.  No alcohol use recently.       Home Medications Prior to Admission medications   Medication Sig Start Date End Date Taking? Authorizing Provider  doxazosin  (CARDURA ) 2 MG tablet Take 2 mg by mouth at bedtime. 01/14/24   [provider]  hydrochlorothiazide  (HYDRODIURIL ) 25 MG tablet Take 1 tablet (25 mg total) by mouth daily. 02/20/23 03/14/24  Trish Furl, MD  lactulose  (CHRONULAC ) 10 GM/15ML solution Take 15 mLs (10 g total) by mouth daily as needed for mild constipation. 03/16/24   Magdalene School, MD  amLODipine  (NORVASC ) 5 MG tablet Take 1 tablet (5 mg total) by mouth daily. Patient not taking: Reported on 06/16/2019 02/24/18 06/16/19  Bart Born, MD      Allergies    Chloroquine and Chlorothen    Review of Systems   Review of Systems  Physical Exam Updated Vital Signs BP (!) 177/98   Pulse (!) 58   Temp 98.2 F (36.8 C) (Oral)   Resp 14   Ht 5\' 10"  (1.778 m)   Wt 102 kg   SpO2 100%   BMI 32.27 kg/m  Physical Exam Vitals and nursing note reviewed.   Constitutional:      General: He is not in acute distress.    Appearance: He is well-developed.  HENT:     Head: Normocephalic and atraumatic.     Right Ear: External ear normal.     Left Ear: External ear normal.     Nose: Nose normal.  Eyes:     General: Scleral icterus present.     Extraocular Movements: Extraocular movements intact.     Pupils: Pupils are equal, round, and reactive to light.  Cardiovascular:     Rate and Rhythm: Normal rate and regular rhythm.     Heart sounds: Normal heart sounds.  Pulmonary:     Effort: Pulmonary effort is normal. No respiratory distress.     Breath sounds: Normal breath sounds.  Abdominal:     General: There is distension.     Palpations: Abdomen is soft. There is no mass.     Tenderness: There is no abdominal tenderness. There is no guarding.  Musculoskeletal:     Cervical back: Normal range of motion and neck supple.     Right lower leg: No edema.     Left lower leg: No edema.  Skin:    General: Skin is warm and dry.  Neurological:     Mental Status: He is alert. Mental status is at baseline.  Comments: No asterixis.  Unable to see out of the right eye due to retinal detachment.  MENTAL STATUS: AAOx3 CRANIAL NERVES: II: Left pupil reactive and 4 mm.  Right pupil nonreactive and 6 mm.  Unable to see out of right eye due to retinal detachment. III, IV, VI: EOM intact, no gaze preference or deviation, no nystagmus. V: normal sensation to light touch in V1, V2, and V3 segments bilaterally VII: no facial weakness or asymmetry, no nasolabial fold flattening VIII: normal hearing to speech and finger friction IX, X: normal palatal elevation, no uvular deviation XI: 5/5 head turn and 5/5 shoulder shrug bilaterally XII: midline tongue protrusion MOTOR: 5/5 strength in R shoulder flexion, elbow flexion and extension, and grip strength. 5/5 strength in L shoulder flexion, elbow flexion and extension, and grip strength.  5/5 strength in R  hip and knee flexion, knee extension, ankle plantar and dorsiflexion. 5/5 strength in L hip and knee flexion, knee extension, ankle plantar and dorsiflexion. SENSORY: Normal sensation to light touch in all extremities COORD: Normal finger to nose and heel to shin, no tremor, no dysmetria   Psychiatric:        Mood and Affect: Mood normal.        Behavior: Behavior normal.     ED Results / Procedures / Treatments   Labs (all labs ordered are listed, but only abnormal results are displayed) Labs Reviewed  COMPREHENSIVE METABOLIC PANEL WITH GFR - Abnormal; Notable for the following components:      Result Value   Sodium 133 (*)    Potassium 3.4 (*)    CO2 18 (*)    Glucose, Bld 142 (*)    BUN 22 (*)    Creatinine, Ser 1.38 (*)    Calcium 8.3 (*)    Albumin  1.9 (*)    AST 427 (*)    ALT 58 (*)    Alkaline Phosphatase 149 (*)    Total Bilirubin 20.1 (*)    All other components within normal limits  CBC WITH DIFFERENTIAL/PLATELET - Abnormal; Notable for the following components:   Hemoglobin 10.4 (*)    HCT 29.9 (*)    MCV 66.6 (*)    MCH 23.2 (*)    RDW 23.4 (*)    All other components within normal limits  PROTIME-INR - Abnormal; Notable for the following components:   Prothrombin Time 18.9 (*)    INR 1.6 (*)    All other components within normal limits  AMMONIA - Abnormal; Notable for the following components:   Ammonia 42 (*)    All other components within normal limits  CBG MONITORING, ED - Abnormal; Notable for the following components:   Glucose-Capillary 128 (*)    All other components within normal limits  MAGNESIUM  ETHANOL  URINALYSIS, ROUTINE W REFLEX MICROSCOPIC  BRAIN NATRIURETIC PEPTIDE    EKG EKG Interpretation Date/Time:  Saturday March 20 2024 13:51:17 EDT Ventricular Rate:  52 PR Interval:  137 QRS Duration:  117 QT Interval:  441 QTC Calculation: 411 R Axis:   9  Text Interpretation: Sinus rhythm Nonspecific intraventricular conduction delay  Minimal ST depression, inferior leads Confirmed by Hiawatha Lout ([id number]) on 03/20/2024 3:12:23 PM  Radiology No results found.  Procedures Procedures   EMERGENCY DEPARTMENT US  ACITES EXAM "Study: Limited Abdominal Ultrasound for Evaluation of Free Fluid"  INDICATIONS: Distention  PERFORMED BY: Myself IMAGES ARCHIVED?: No VIEWS USES: Right lower quad and Left lower quad INTERPRETATION: Free fluid present  Medications Ordered in ED Medications  methylPREDNISolone sodium succinate (SOLU-MEDROL) 125 mg/2 mL injection 125 mg (125 mg Intravenous Given 03/20/24 1435)  ipratropium-albuterol  (DUONEB) 0.5-2.5 (3) MG/3ML nebulizer solution 6 mL (6 mLs Nebulization Given 03/20/24 1435)  gadobutrol  (GADAVIST ) 1 MMOL/ML injection 10 mL (10 mLs Intravenous Contrast Given 03/20/24 1554)    ED Course/ Medical Decision Making/ A&P Clinical Course as of 03/20/24 1643  Sat Mar 20, 2024  1511 Signed out to Dr Isaiah Marc [RP]  1512 Received sign out from Dr. Efraim Grange, presenting with shaking episode, shortness of breath. Pending CXR/MRI. Received medication and steroids.  [WS]  1513 Discussed with Dr Doretta Gant from neurology who recommends MRI and spot EEG.  [RP]    Clinical Course User Index [RP] Ninetta Basket, MD [WS] Mordecai Applebaum, MD                                 Medical Decision Making Amount and/or Complexity of Data Reviewed Labs: ordered. Radiology: ordered.  Risk Prescription drug management.   Diago Justiniano is a 48 y.o. male with comorbidities that complicate the patient evaluation including cirrhosis likely due to hepatitis and diabetes who presents emergency department with abdominal swelling, shortness of breath, and seizure-like activity  Initial Ddx:  Ascites, CHF, COPD/asthma, hepatic encephalopathy, anemia, SBP  MDM/Course:  Patient presents to the emergency department with abdominal swelling and shortness of breath.  Also with second time seizure and is  not on any antiepileptics.  Does have a history of cirrhosis.  No significant tenderness palpation to suggest SBP.  Does have significant distention however.  Does have wheezing on his exam.  No asterixis.  With his history of smoking we will go ahead and treat him for possible undiagnosed COPD with Solu-Medrol and DuoNebs.  Labs returned and showed that he does have a nonanion gap metabolic acidosis with slight worsening of his renal function and elevation of his bilirubin.  Suspect that he may have hepatorenal syndrome.  Has already had a head CT and MRI was discussed with neurology who recommended repeat MRI with and without contrast.  Recommends holding off on antiepileptics for now but to get a spot EEG.  Signed out to the oncoming physician awaiting results of his imaging.  Suspect that he will require admission for therapeutic paracentesis as well as further evaluation of the seizure like activity.   This patient presents to the ED for concern of complaints listed in HPI, this involves an extensive number of treatment options, and is a complaint that carries with it a high risk of complications and morbidity. Disposition including potential need for admission considered.   Dispo: Pending remainder of workup  Additional history obtained from son Records reviewed Outpatient Clinic Notes The following labs were independently interpreted: Chemistry and show  liver failure I personally reviewed and interpreted cardiac monitoring: normal sinus rhythm  I personally reviewed and interpreted the pt's EKG: see above for interpretation  I have reviewed the patients home medications and made adjustments as needed Consults: Neurology  Portions of this note were generated with Dragon dictation software. Dictation errors may occur despite best attempts at proofreading.     Final Clinical Impression(s) / ED Diagnoses Final diagnoses:  Seizure-like activity (HCC)  Shortness of breath  Hepatorenal  syndrome (HCC)  Other ascites    Rx / DC Orders ED Discharge Orders     None  Ninetta Basket, MD 03/20/24 [phone]

## 2024-03-20 NOTE — ED Notes (Signed)
 Floor called to inform that transport has been called for patient; Transport en route to transport patient to [facility]

## 2024-03-20 NOTE — ED Notes (Signed)
 Pt returned from MRI

## 2024-03-20 NOTE — H&P (Signed)
 History and Physical    Sinclair Thresher JYN:[id number] DOB: 1976-03-01 DOA: 03/20/2024  PCP: [name], PA-C  Patient coming from: Home  I have personally briefly reviewed patient's old medical records available.   Chief Complaint: Abdominal pain, poor appetite, shakiness.  HPI: Govanni Vaquera is a 48 y.o. male with medical history significant of right-sided retinal detachment followed by ophthalmology, essential hypertension, hyperlipidemia, type 2 diabetes with recently known A1c of 6.6 on glipizide, chronic combined heart failure with known ejection fraction 40 to 45%, moderate to severe mitral regurgitation, cirrhosis of liver thought to be chronic hepatitis B with recent multiple investigations, recent admission to the hospital with nausea vomiting and right upper quadrant abdominal pain and discharged on 4/22 with suggested outpatient follow-up presents back to the emergency room with ongoing abdominal discomfort, right upper quadrant discomfort ongoing and worsening for last few days, progressively worsening abdominal distention that he thinks he is retaining fluid, poor appetite.  Denies any nausea or vomiting.  Icteric eyes.  Yellow urine.  Bowel movements are normal.  He is taking lactulose .  No fever or chills. Apparently, he thought he has low blood sugar today and asked his son to give him some food and he had 5 episodes of shakiness without loss of consciousness or incontinence that was thought to be seizure-like episode lasting about 1 minute, no post ictal confusion so family called EMS and patient was brought to the ER. Patient's main concern is distention of the abdomen.  Cirrhotic history: Admitted 12/2022 at [facility] hospital with new diagnosis of hepatic cirrhosis with portal hypertension, acute versus chronic hepatitis B and cholestatic jaundice. Viral Hepatitis Panel demonstrated HAV (previous exposure/Immune), HCV (non-reactive), HBV (reactive HBV SuAg,  and HBV CoAb). Quant Immunoglobulin = elevated IgG (1,785) w/ normal IgM (63) and IgA (267). Hep Serologies = AFP (elevated, 9.0), Ceruloplasmin WNL, Mitochondrial Tot Ab Negative  Elevated LFTs (AST-598, ALT-62, ALP-190, TB-10.3 [Direct-5.3]). Acetaminophen  = normal (<10). EtOH negative and he denied any recent history of alcohol consumption.  Patient was referred to hepatology but has not follow-up yet. Admitted last week to our hospital with abdominal pain, bilirubin of 15.4 and discharged with advised to follow-up.  Seizure history: Reportedly seizure-like episodes, multiple MRIs at [facility], MRI brain 02/16/24 MRI brain today without any acute findings.  Some chronic findings and demyelination. EEG negative for acute seizure.  Not on any seizure medications.   ED Course: Patient neurologically stable.  Alert awake and oriented.  Vitals are stable. Renal function test, slight elevated creatinine of 1.38. LFTs with AST/ALT 427/58 at about his chronic levels Bilirubin 12.1, it was 15 about a week ago. No evidence of tremors or asterixis. Case discussed with neurology by ER physician.  Spot EEG was without any abnormalities.  Admitted for management of cirrhosis and ascites and abdominal pain. Patient complained of shortness of breath and he was given a dose of Solu-Medrol and nebulizer. On my interview only complaint is mild abdominal discomfort and being hungry.  Review of Systems: all systems are reviewed and pertinent positive as per HPI otherwise rest are negative.    Past Medical History:  Diagnosis Date   Diabetes mellitus without complication (HCC)    Hypertension     Past Surgical History:  Procedure Laterality Date   INCISION AND DRAINAGE ABSCESS Left 04/16/2020   Procedure: INCISION AND DRAINAGE BUTTOCK ABSCESS;  Surgeon: Oralee Billow, MD;  Location: [facility];  Service: General;  Laterality: Left;   RETINAL DETACHMENT  SURGERY Right 03/08/2024    Social history    reports that he has never smoked. He has never used smokeless tobacco. He reports that he does not currently use alcohol. He reports that he does not currently use drugs.  Allergies  Allergen Reactions   Chloroquine Itching   Chlorothen Hives, Itching and Other (See Comments)    "Chlorothen (trade name Forensic scientist) is an antihistamine and anticholinergic"    History reviewed. No pertinent family history.   Prior to Admission medications   Medication Sig Start Date End Date Taking? Authorizing Provider  doxazosin  (CARDURA ) 2 MG tablet Take 2 mg by mouth at bedtime. 01/14/24   [provider]  hydrochlorothiazide  (HYDRODIURIL ) 25 MG tablet Take 1 tablet (25 mg total) by mouth daily. 02/20/23 03/14/24  Trish Furl, MD  lactulose  (CHRONULAC ) 10 GM/15ML solution Take 15 mLs (10 g total) by mouth daily as needed for mild constipation. 03/16/24   Magdalene School, MD  amLODipine  (NORVASC ) 5 MG tablet Take 1 tablet (5 mg total) by mouth daily. Patient not taking: Reported on 06/16/2019 02/24/18 06/16/19  Bart Born, MD    Physical Exam: Vitals:   03/20/24 1312 03/20/24 1448 03/20/24 1750 03/20/24 1757  BP:  (!) 177/98  (!) 155/91  Pulse:    64  Resp:  14  16  Temp:   98.2 F (36.8 C)   TempSrc:   Oral   SpO2:    100%  Weight: 102 kg     Height: 5\' 10"  (1.778 m)       Constitutional: NAD, calm, comfortable.  Pleasant interactive. Vitals:   03/20/24 1312 03/20/24 1448 03/20/24 1750 03/20/24 1757  BP:  (!) 177/98  (!) 155/91  Pulse:    64  Resp:  14  16  Temp:   98.2 F (36.8 C)   TempSrc:   Oral   SpO2:    100%  Weight: 102 kg     Height: 5\' 10"  (1.778 m)      Eyes: PERRL, lids and conjunctivae normal.  Deeply icteric.  Patient only have waves of finger on the right eys.  Left eye vision is normal. ENMT: Mucous membranes are moist. Posterior pharynx clear of any exudate or lesions.Normal dentition.  Neck: normal, supple, no masses, no thyromegaly Respiratory: clear to  auscultation bilaterally, no wheezing, no crackles. Normal respiratory effort. No accessory muscle use.  Cardiovascular: Regular rate and rhythm, no murmurs / rubs / gallops. No extremity edema. 2+ pedal pulses. No carotid bruits.  Abdomen: no tenderness, firm hepatomegaly present.  Slight fullness on the right flank.  No palpable fluid thrill.  Bowel sound present. Musculoskeletal: no clubbing / cyanosis. No joint deformity upper and lower extremities. Good ROM, no contractures. Normal muscle tone.  Skin: no rashes, lesions, ulcers. No induration Neurologic: CN 2-12 grossly intact. Sensation intact, DTR normal. Strength 5/5 in all 4.  Psychiatric: Normal judgment and insight. Alert and oriented x 3. Normal mood.     Labs on Admission: I have personally reviewed following labs and imaging studies  CBC: Recent Labs  Lab 03/14/24 1408 03/15/24 0525 03/16/24 0551 03/20/24 1317  WBC 6.4 6.3 5.5 5.4  NEUTROABS  --   --   --  2.5  HGB 11.2* 9.5* 9.8* 10.4*  HCT 32.5* 27.2* 27.5* 29.9*  MCV 67.4* 67.2* 65.9* 66.6*  PLT 161 135* 134* 159   Basic Metabolic Panel: Recent Labs  Lab 03/14/24 1408 03/15/24 0525 03/16/24 0551 03/20/24 1317  NA 133* 133* 131*  133*  K 3.7 3.7 3.2* 3.4*  CL 107 109 105 107  CO2 20* 21* 21* 18*  GLUCOSE 146* 90 81 142*  BUN 17 19 16  22*  CREATININE 0.96 1.16 0.93 1.38*  CALCIUM 8.0* 7.8* 7.8* 8.3*  MG  --  1.9 1.9 2.0  PHOS  --  2.6 2.8  --    GFR: Estimated Creatinine Clearance: 80 mL/min (A) (by C-G formula based on SCr of 1.38 mg/dL (H)). Liver Function Tests: Recent Labs  Lab 03/14/24 1408 03/15/24 0525 03/20/24 1317  AST 449* 364* 427*  ALT 60* 46* 58*  ALKPHOS 194* 164* 149*  BILITOT 15.4* 14.7* 20.1*  PROT 6.7 6.3* 7.1  ALBUMIN  1.9* 1.9* 1.9*   Recent Labs  Lab 03/14/24 1408  LIPASE 29   Recent Labs  Lab 03/20/24 1317  AMMONIA 42*   Coagulation Profile: Recent Labs  Lab 03/14/24 1705 03/15/24 1558 03/20/24 1317  INR  1.4* 1.4* 1.6*   Cardiac Enzymes: No results for input(s): "CKTOTAL", "CKMB", "CKMBINDEX", "TROPONINI" in the last 168 hours. BNP (last 3 results) No results for input(s): "PROBNP" in the last 8760 hours. HbA1C: No results for input(s): "HGBA1C" in the last 72 hours. CBG: Recent Labs  Lab 03/14/24 1321 03/15/24 1648 03/20/24 1346  GLUCAP 182* 116* 128*   Lipid Profile: No results for input(s): "CHOL", "HDL", "LDLCALC", "TRIG", "CHOLHDL", "LDLDIRECT" in the last 72 hours. Thyroid Function Tests: No results for input(s): "TSH", "T4TOTAL", "FREET4", "T3FREE", "THYROIDAB" in the last 72 hours. Anemia Panel: No results for input(s): "VITAMINB12", "FOLATE", "FERRITIN", "TIBC", "IRON", "RETICCTPCT" in the last 72 hours. Urine analysis:    Component Value Date/Time   COLORURINE AMBER (A) 03/20/2024 1730   APPEARANCEUR CLEAR 03/20/2024 1730   LABSPEC 1.029 03/20/2024 1730   PHURINE 5.0 03/20/2024 1730   GLUCOSEU NEGATIVE 03/20/2024 1730   HGBUR SMALL (A) 03/20/2024 1730   BILIRUBINUR MODERATE (A) 03/20/2024 1730   KETONESUR NEGATIVE 03/20/2024 1730   PROTEINUR >=300 (A) 03/20/2024 1730   NITRITE NEGATIVE 03/20/2024 1730   LEUKOCYTESUR NEGATIVE 03/20/2024 1730    Radiological Exams on Admission: EEG adult Result Date: 03/20/2024 Augustin Leber, MD     03/20/2024  5:47 PM History: 48 yo M with shaking concecrning for seizure Sedation: none Patient State: Awake and drowsy Technique: This EEG was acquired with electrodes placed according to the International 10-20 electrode system (including Fp1, Fp2, F3, F4, C3, C4, P3, P4, O1, O2, T3, T4, T5, T6, A1, A2, Fz, Cz, Pz). The following electrodes were missing or displaced: none. Background: The background consists of intermixed alpha and beta activities. There is a well defined posterior dominant rhythm of 9 Hz that attenuates with eye opening. There is mild anterior shifting of PDR with drowsiness. Photic stimulation: Physiologic  driving is present EEG Abnormalities: none Clinical Interpretation: This normal EEG is recorded in the waking and drowsy state. There was no seizure or seizure predisposition recorded on this study. Please note that lack of epileptiform activity on EEG does not preclude the possibility of epilepsy. Ann Keto, MD [facility] If 7pm- 7am, please page neurology on call as listed in AMION.  DG Chest 2 View Result Date: 03/20/2024 CLINICAL DATA:  Shortness of breath.  Seizure. EXAM: CHEST - 2 VIEW COMPARISON:  Chest radiograph July 18, 2022 FINDINGS: Stable cardiomegaly. Mild elevation right hemidiaphragm. No large area pulmonary consolidation. No pleural effusion or pneumothorax. Osseous structures unremarkable. IMPRESSION: Cardiomegaly. No acute cardiopulmonary process. Electronically Signed   By: Carlus Chihuahua  Nolon Baxter M.D.   On: 03/20/2024 17:29   MR Brain W and Wo Contrast Result Date: 03/20/2024 CLINICAL DATA:  Seizure. EXAM: MRI HEAD WITHOUT AND WITH CONTRAST TECHNIQUE: Multiplanar, multiecho pulse sequences of the brain and surrounding structures were obtained without and with intravenous contrast. CONTRAST:  10mL GADAVIST  GADOBUTROL  1 MMOL/ML IV SOLN COMPARISON:  MR head without contrast 02/17/2024 FINDINGS: Brain: No acute infarct, hemorrhage, or mass lesion is present. Periventricular and subcortical T2 hyperintensities bilaterally are moderately advanced for age. The ventricles are of normal size. Deep brain nuclei are within normal limits. The brainstem and cerebellum are within normal limits. The internal auditory canals are within normal limits. Midline structures are within normal limits. Postcontrast images demonstrate no pathologic enhancement. Vascular: Flow is present in the major intracranial arteries. Skull and upper cervical spine: The craniocervical junction is normal. Upper cervical spine is within normal limits. Marrow signal is unremarkable. Sinuses/Orbits: Polyps or mucous  retention cysts are again noted in the right maxillary sinus. The paranasal sinuses and mastoid air cells are otherwise clear. The globes and orbits are within normal limits. IMPRESSION: 1. No acute intracranial abnormality or significant interval change. 2. Periventricular and subcortical T2 hyperintensities bilaterally are moderately advanced for age. The finding is nonspecific but can be seen in the setting of chronic microvascular ischemia, a demyelinating process such as multiple sclerosis, vasculitis, complicated migraine headaches, or as the sequelae of a prior infectious or inflammatory process. Electronically Signed   By: Audree Leas M.D.   On: 03/20/2024 17:18    EKG: Independently reviewed.  Normal sinus rhythm.  Assessment/Plan Principal Problem:   Subacute liver failure Active Problems:   Diabetes (HCC)   Seizure (HCC)   Retinal detachment   Essential hypertension   Chronic hepatitis B (HCC)     Subacute liver failure, decompensated with ascites causing abdominal pain. Extensively investigated in the past.  Supposed to follow-up with hepatology at [facility].  Currently does not have any evidence of fulminant hepatic failure. Admit.  Avoid Tylenol  for pain.  Will use oxycodone . Paracentesis and diagnostic labs.  Currently no clinical evidence of SBP. Will monitor for symptoms, repeat labs tomorrow morning.  If continues to worsen, may discuss with [facility] for in-hospital transfer.  However, if patient can achieve some symptom control he can follow-up as outpatient. Continue lactulose  with aim to make 2-3 loose bowel movements a day. Monitor electrolytes, will check ammonia levels.  PT/INR monitoring.  2.  Suspected seizure: Atypical presentation.  EEG without evidence of seizure activities.  Seizure precautions.  Monitor.  Currently no indication for AEDs.  Neurology to follow-up with  3.  Type 2 diabetes: Well-controlled.  Currently off medications at home.   Will keep on sliding scale insulin .  4.  Essential hypertension blood pressure is stable.  Resume doxazosin  and hydrochlorothiazide .  Will supplement potassium.  DVT prophylaxis: SCDs Code Status: Full code Family Communication: None at the bedside Disposition Plan: Home when stable Consults called: Neurology, called by ER Admission status: Inpatient.   Vada Garibaldi MD [facility]

## 2024-03-20 NOTE — ED Provider Notes (Signed)
   ED Course / MDM   Clinical Course as of 03/20/24 1737  Sat Mar 20, 2024  1511 Signed out to Dr Isaiah Marc [RP]  1512 Received sign out from Dr. Efraim Grange, presenting with shaking episode, shortness of breath. Pending CXR/MRI. Received medication and steroids.  [WS]  1513 Discussed with Dr Doretta Gant from neurology who recommends MRI and spot EEG.  [RP]  1736 MRI, chest x-ray negative.  Discussed with Dr. Shereen Dike who will admit [WS]    Clinical Course User Index [RP] Ninetta Basket, MD [WS] Mordecai Applebaum, MD   Medical Decision Making Amount and/or Complexity of Data Reviewed Labs: ordered. Radiology: ordered.  Risk Prescription drug management. Decision regarding hospitalization.         Mordecai Applebaum, MD 03/20/24 [id number]

## 2024-03-20 NOTE — Progress Notes (Signed)
 EEG complete. Results are pending.

## 2024-03-20 NOTE — Procedures (Signed)
 History: 48 yo M with shaking concecrning for seizure  Sedation: none  Patient State: Awake and drowsy  Technique: This EEG was acquired with electrodes placed according to the International 10-20 electrode system (including Fp1, Fp2, F3, F4, C3, C4, P3, P4, O1, O2, T3, T4, T5, T6, A1, A2, Fz, Cz, Pz). The following electrodes were missing or displaced: none.   Background: The background consists of intermixed alpha and beta activities. There is a well defined posterior dominant rhythm of 9 Hz that attenuates with eye opening. There is mild anterior shifting of PDR with drowsiness.   Photic stimulation: Physiologic driving is present  EEG Abnormalities: none  Clinical Interpretation: This normal EEG is recorded in the waking and drowsy state. There was no seizure or seizure predisposition recorded on this study. Please note that lack of epileptiform activity on EEG does not preclude the possibility of epilepsy.   Ann Keto, MD [facility]   If 7pm- 7am, please page neurology on call as listed in AMION.

## 2024-03-20 NOTE — Consult Note (Signed)
 NEUROLOGY CONSULT NOTE   Date of service: March 20, 2024 Patient Name: Sean Foster MRN:  [id number] DOB:  12-15-1975 Chief Complaint: "spell concerning for seizures" Requesting Provider: Vada Garibaldi, MD  History of Present Illness  Sean Foster is a 48 y.o. male with hx of DM2, HTN, retinal detachment, HTN, HLD, mitral regurg, liver cirrhosis   has a past medical history of Diabetes mellitus without complication (HCC) and Hypertension.   LKW: *** Modified rankin score: {Modified Rankin Scale:21264} IV Thrombolysis: ***Yes, *** No (reason) EVT: ***Yes, *** No (reason) ICH Score:***  NIHSS components Score: Comment  1a Level of Conscious 0[]  1[]  2[]  3[]      1b LOC Questions 0[]  1[]  2[]       1c LOC Commands 0[]  1[]  2[]       2 Best Gaze 0[]  1[]  2[]       3 Visual 0[]  1[]  2[]  3[]      4 Facial Palsy 0[]  1[]  2[]  3[]      5a Motor Arm - left 0[]  1[]  2[]  3[]  4[]  UN[]    5b Motor Arm - Right 0[]  1[]  2[]  3[]  4[]  UN[]    6a Motor Leg - Left 0[]  1[]  2[]  3[]  4[]  UN[]    6b Motor Leg - Right 0[]  1[]  2[]  3[]  4[]  UN[]    7 Limb Ataxia 0[]  1[]  2[]  UN[]      8 Sensory 0[]  1[]  2[]  UN[]      9 Best Language 0[]  1[]  2[]  3[]      10 Dysarthria 0[]  1[]  2[]  UN[]      11 Extinct. and Inattention 0[]  1[]  2[]       TOTAL:       ROS  ***Comprehensive ROS performed and pertinent positives documented in HPI  ***Unable to ascertain due to ***  Past History   Past Medical History:  Diagnosis Date   Diabetes mellitus without complication (HCC)    Hypertension     Past Surgical History:  Procedure Laterality Date   INCISION AND DRAINAGE ABSCESS Left 04/16/2020   Procedure: INCISION AND DRAINAGE BUTTOCK ABSCESS;  Surgeon: Oralee Billow, MD;  Location: [facility];  Service: General;  Laterality: Left;   RETINAL DETACHMENT SURGERY Right 03/08/2024    Family History: History reviewed. No pertinent family history.  Social History  reports that he has never smoked. He has never used smokeless tobacco. He  reports that he does not currently use alcohol. He reports that he does not currently use drugs.  Allergies  Allergen Reactions   Chloroquine Itching   Chlorothen Hives, Itching and Other (See Comments)    "Chlorothen (trade name Forensic scientist) is an antihistamine and anticholinergic"    Medications   Current Facility-Administered Medications:    doxazosin  (CARDURA ) tablet 2 mg, 2 mg, Oral, QHS, Ghimire, Kuber, MD, 2 mg at 03/20/24 2140   hydrochlorothiazide  (HYDRODIURIL ) tablet 25 mg, 25 mg, Oral, Daily, Ghimire, Kuber, MD, 25 mg at 03/20/24 2141   insulin  aspart (novoLOG ) injection 0-5 Units, 0-5 Units, Subcutaneous, QHS, Ghimire, Kuber, MD, 5 Units at 03/20/24 2141   [START ON 03/21/2024] insulin  aspart (novoLOG ) injection 0-9 Units, 0-9 Units, Subcutaneous, TID WC, Ghimire, Kuber, MD   lactulose  (CHRONULAC ) 10 GM/15ML solution 20 g, 20 g, Oral, BID, Vada Garibaldi, MD, 20 g at 03/20/24 2140   ondansetron  (ZOFRAN ) tablet 4 mg, 4 mg, Oral, Q6H PRN **OR** ondansetron  (ZOFRAN ) injection 4 mg, 4 mg, Intravenous, Q6H PRN, Ghimire, Kuber, MD   oxyCODONE  (Oxy IR/ROXICODONE ) immediate release tablet 5 mg, 5 mg, Oral, Q4H PRN, Ghimire, Kuber, MD  Vitals  Vitals:   03/20/24 1448 03/20/24 1750 03/20/24 1757 03/20/24 2014  BP: (!) 177/98  (!) 155/91 (!) 142/82  Pulse:   64 73  Resp: 14  16 18   Temp:  98.2 F (36.8 C)  98.2 F (36.8 C)  TempSrc:  Oral  Oral  SpO2:   100% 100%  Weight:      Height:        Body mass index is 32.27 kg/m.  Physical Exam   Constitutional: Appears well-developed and well-nourished. *** Psych: Affect appropriate to situation. *** Eyes: No scleral injection. *** HENT: No OP obstruction. *** Head: Normocephalic. *** Cardiovascular: Normal rate and regular rhythm. *** Respiratory: Effort normal, non-labored breathing. *** GI: Soft.  No distension. There is no tenderness. *** Skin: WDI. ***  Neurologic Examination   ***  Labs/Imaging/Neurodiagnostic  studies   CBC:  Recent Labs  Lab 03-21-24 0551 03/20/24 1317  WBC 5.5 5.4  NEUTROABS  --  2.5  HGB 9.8* 10.4*  HCT 27.5* 29.9*  MCV 65.9* 66.6*  PLT 134* 159   Basic Metabolic Panel:  Lab Results  Component Value Date   NA 133 (L) 03/20/2024   K 3.4 (L) 03/20/2024   CO2 18 (L) 03/20/2024   GLUCOSE 142 (H) 03/20/2024   BUN 22 (H) 03/20/2024   CREATININE 1.38 (H) 03/20/2024   CALCIUM 8.3 (L) 03/20/2024   GFRNONAA >60 03/20/2024   GFRAA >60 04/17/2020   Lipid Panel: No results found for: "LDLCALC" HgbA1c:  Lab Results  Component Value Date   HGBA1C 13.1 (H) 04/17/2020   Urine Drug Screen:     Component Value Date/Time   LABOPIA NONE DETECTED 03/14/2024 1653   COCAINSCRNUR NONE DETECTED 03/14/2024 1653   LABBENZ NONE DETECTED 03/14/2024 1653   AMPHETMU NONE DETECTED 03/14/2024 1653   THCU NONE DETECTED 03/14/2024 1653   LABBARB NONE DETECTED 03/14/2024 1653    Alcohol Level     Component Value Date/Time   ETH <15 03/20/2024 1317   INR  Lab Results  Component Value Date   INR 1.6 (H) 03/20/2024   APTT No results found for: "APTT" AED levels: No results found for: "PHENYTOIN", "ZONISAMIDE", "LAMOTRIGINE", "LEVETIRACETA"  CT Head without contrast(Personally reviewed): ***  CT angio Head and Neck with contrast(Personally reviewed): ***  MR Angio head without contrast and Carotid Duplex BL(Personally reviewed): ***  MRI Brain(Personally reviewed): ***  Neurodiagnostics rEEG:  ***  ASSESSMENT   [name] is a 48 y.o. male ***  RECOMMENDATIONS  *** ______________________________________________________________________    Jearldine Mina, MD [facility]

## 2024-03-21 ENCOUNTER — Inpatient Hospital Stay (HOSPITAL_COMMUNITY)

## 2024-03-21 DIAGNOSIS — K7200 Acute and subacute hepatic failure without coma: Secondary | ICD-10-CM | POA: Diagnosis not present

## 2024-03-21 LAB — ALBUMIN, PLEURAL OR PERITONEAL FLUID: Albumin, Fluid: <1.5 g/dL

## 2024-03-21 LAB — BODY FLUID CELL COUNT WITH DIFFERENTIAL
Eos, Fluid: 0 %
Lymphs, Fluid: 37 %
Monocyte-Macrophage-Serous Fluid: 58 % (ref 50–90)
Neutrophil Count, Fluid: 5 % (ref 0–25)
Total Nucleated Cell Count, Fluid: 38 uL (ref 0–1000)

## 2024-03-21 LAB — CBC
HCT: 28.3 % — ABNORMAL LOW (ref 39.0–52.0)
Hemoglobin: 10.1 g/dL — ABNORMAL LOW (ref 13.0–17.0)
MCH: 23.4 pg — ABNORMAL LOW (ref 26.0–34.0)
MCHC: 35.7 g/dL (ref 30.0–36.0)
MCV: 65.5 fL — ABNORMAL LOW (ref 80.0–100.0)
Platelets: 156 10*3/uL (ref 150–400)
RBC: 4.32 MIL/uL (ref 4.22–5.81)
RDW: 23.4 % — ABNORMAL HIGH (ref 11.5–15.5)
WBC: 10.3 10*3/uL (ref 4.0–10.5)
nRBC: 0 % (ref 0.0–0.2)

## 2024-03-21 LAB — COMPREHENSIVE METABOLIC PANEL WITH GFR
ALT: 37 U/L (ref 0–44)
AST: 247 U/L — ABNORMAL HIGH (ref 15–41)
Albumin: 1.6 g/dL — ABNORMAL LOW (ref 3.5–5.0)
Alkaline Phosphatase: 133 U/L — ABNORMAL HIGH (ref 38–126)
Anion gap: 6 (ref 5–15)
BUN: 30 mg/dL — ABNORMAL HIGH (ref 6–20)
CO2: 16 mmol/L — ABNORMAL LOW (ref 22–32)
Calcium: 8.1 mg/dL — ABNORMAL LOW (ref 8.9–10.3)
Chloride: 109 mmol/L (ref 98–111)
Creatinine, Ser: 1.56 mg/dL — ABNORMAL HIGH (ref 0.61–1.24)
GFR, Estimated: 55 mL/min — ABNORMAL LOW
Glucose, Bld: 258 mg/dL — ABNORMAL HIGH (ref 70–99)
Potassium: 3.7 mmol/L (ref 3.5–5.1)
Sodium: 131 mmol/L — ABNORMAL LOW (ref 135–145)
Total Bilirubin: 18 mg/dL — ABNORMAL HIGH (ref 0.0–1.2)
Total Protein: 7 g/dL (ref 6.5–8.1)

## 2024-03-21 LAB — PROTEIN, PLEURAL OR PERITONEAL FLUID: Total protein, fluid: <3 g/dL

## 2024-03-21 LAB — LACTATE DEHYDROGENASE, PLEURAL OR PERITONEAL FLUID: LD, Fluid: 27 U/L — ABNORMAL HIGH (ref 3–23)

## 2024-03-21 LAB — PROTIME-INR
INR: 1.7 — ABNORMAL HIGH (ref 0.8–1.2)
Prothrombin Time: 20.1 s — ABNORMAL HIGH (ref 11.4–15.2)

## 2024-03-21 LAB — GLUCOSE, CAPILLARY
Glucose-Capillary: 239 mg/dL — ABNORMAL HIGH (ref 70–99)
Glucose-Capillary: 290 mg/dL — ABNORMAL HIGH (ref 70–99)
Glucose-Capillary: 277 mg/dL — ABNORMAL HIGH (ref 70–99)
Glucose-Capillary: 249 mg/dL — ABNORMAL HIGH (ref 70–99)

## 2024-03-21 LAB — GRAM STAIN: Gram Stain: NONE SEEN

## 2024-03-21 LAB — PHOSPHORUS: Phosphorus: 3.4 mg/dL (ref 2.5–4.6)

## 2024-03-21 LAB — AMMONIA: Ammonia: 86 umol/L — ABNORMAL HIGH (ref 9–35)

## 2024-03-21 LAB — MAGNESIUM: Magnesium: 1.9 mg/dL (ref 1.7–2.4)

## 2024-03-21 MED ORDER — LIDOCAINE HCL (PF) 1 % IJ SOLN
INTRAMUSCULAR | Status: AC
  Filled 2024-03-21: qty 30

## 2024-03-21 MED ORDER — ALBUMIN HUMAN 25 % IV SOLN
12.5000 g | Freq: Four times a day (QID) | INTRAVENOUS | Status: AC
  Administered 2024-03-21 – 2024-03-22 (×4): 12.5 g via INTRAVENOUS
  Filled 2024-03-21 (×4): qty 50

## 2024-03-21 MED ORDER — LIDOCAINE HCL (PF) 1 % IJ SOLN: 10.0000 mL | Freq: Once | INTRAMUSCULAR | Status: DC

## 2024-03-21 MED ORDER — FUROSEMIDE 10 MG/ML IJ SOLN
40.0000 mg | Freq: Two times a day (BID) | INTRAMUSCULAR | Status: DC
Start: 2024-03-21 — End: 2024-03-22
  Administered 2024-03-21: 40 mg via INTRAVENOUS
  Filled 2024-03-21: qty 4

## 2024-03-21 NOTE — Plan of Care (Signed)
  Problem: Nutritional: Goal: Maintenance of adequate nutrition will improve Outcome: Progressing Goal: Progress toward achieving an optimal weight will improve Outcome: Progressing   Problem: Skin Integrity: Goal: Risk for impaired skin integrity will decrease Outcome: Progressing   Problem: Tissue Perfusion: Goal: Adequacy of tissue perfusion will improve Outcome: Progressing   Problem: Clinical Measurements: Goal: Ability to maintain clinical measurements within normal limits will improve Outcome: Progressing Goal: Will remain free from infection Outcome: Progressing Goal: Diagnostic test results will improve Outcome: Progressing Goal: Respiratory complications will improve Outcome: Progressing Goal: Cardiovascular complication will be avoided Outcome: Progressing   Problem: Activity: Goal: Risk for activity intolerance will decrease Outcome: Progressing   Problem: Elimination: Goal: Will not experience complications related to bowel motility Outcome: Progressing Goal: Will not experience complications related to urinary retention Outcome: Progressing   Problem: Pain Managment: Goal: General experience of comfort will improve and/or be controlled Outcome: Progressing   Problem: Safety: Goal: Ability to remain free from injury will improve Outcome: Progressing

## 2024-03-21 NOTE — Progress Notes (Signed)
 PROGRESS NOTE    Sean Foster  MWU:[id number] DOB: 1976-10-26 DOA: 03/20/2024 PCP: [name], PA-C    Brief Narrative:  48 y.o. male with medical history significant of right-sided retinal detachment followed by ophthalmology, essential hypertension, hyperlipidemia, type 2 diabetes with recently known A1c of 6.6 on glipizide, chronic combined heart failure with known ejection fraction 40 to 45%, moderate to severe mitral regurgitation, cirrhosis of liver thought to be chronic hepatitis B with recent multiple investigations, recent admission to the hospital with nausea vomiting and right upper quadrant abdominal pain and discharged on 4/22 with suggested outpatient follow-up presented back to the emergency room with ongoing abdominal discomfort, right upper quadrant discomfort ongoing and worsening for last few days, progressively worsening abdominal distention that he thinks he is retaining fluid, poor appetite.    ED Course: Patient neurologically stable.  Alert awake and oriented.  Vitals are stable. Renal function test, slight elevated creatinine of 1.38. LFTs with AST/ALT 427/58 at about his chronic levels Bilirubin 20, it was 15 about a week ago. No evidence of tremors or asterixis. Case discussed with neurology by ER physician.  Spot EEG was without any abnormalities.  Admitted for management of cirrhosis and ascites and abdominal pain. Patient complained of shortness of breath and he was given a dose of Solu-Medrol and nebulizer.  Subjective: Patient seen and examined.  Did not sleep well last night.  Tired.  600 mL removed from his abdomen.  Feels exhausted.  Still feels bloated.  No other overnight events.  Denies any nausea or vomiting.   Assessment & Plan:   Subacute liver failure, decompensated with ascites causing abdominal pain and discomfort: Extensively investigated in the past.  Supposed to follow-up with hepatology at [facility].  Currently does not have  any evidence of fulminant hepatic failure. Status post paracentesis, labs pending.  Transudate on initial exam.  No WBC count. Continue lactulose . Ammonia levels are normal.  PT/INR 1.7. Will start patient on IV Lasix  along with albumin  infusion to see if we can achieve some symptom control. Recheck tomorrow morning.  Suspected seizure: Atypical presentation.  EEG without evidence of seizure activities.  Seizure precautions.  Less likely seizure episodes.   Type 2 diabetes: Well-controlled.  Currently off medications at home.  Will keep on sliding scale insulin .   Essential hypertension blood pressure is stable.  Resume doxazosin .  Hold hydrochlorothiazide .  Electrolytes adequate today.   Acute kidney injury: Likely multifactorial.  Rule out hepatorenal syndrome.  Albumin  and Lasix  today.  Holding diuretics.  Recheck tomorrow morning.   DVT prophylaxis: SCDs Start: 03/20/24 2038   Code Status: Full code Family Communication: None at the bedside Disposition Plan: Status is: Inpatient Remains inpatient appropriate because: Significant symptoms, IV diuresis     Consultants:  None  Procedures:  Paracentesis  Antimicrobials:  None     Objective: Vitals:   03/21/24 0027 03/21/24 0435 03/21/24 0812 03/21/24 1136  BP: (!) 148/79 (!) 161/94 (!) 151/84 (!) 168/86  Pulse: 60 61 67 62  Resp: 18 18 18 18   Temp: 97.8 F (36.6 C) 97.7 F (36.5 C) 98.3 F (36.8 C) 97.9 F (36.6 C)  TempSrc: Oral Oral Oral Oral  SpO2: 100% 94% 99% 100%  Weight:      Height:        Intake/Output Summary (Last 24 hours) at 03/21/2024 1306 Last data filed at 03/21/2024 0500 Gross per 24 hour  Intake --  Output 400 ml  Net -400 ml   Sean Foster  Weights   03/20/24 1312  Weight: 102 kg    Examination:  General exam: Appears calm and comfortable  Alert awake and oriented.  Interactive.  Deeply icteric. Respiratory system: Clear to auscultation. Respiratory effort normal. Cardiovascular system:  S1 & S2 heard, RRR.  Gastrointestinal system: Mildly distended.  No fluid thrill.  Bowel sounds present.  Nontender. Central nervous system: Alert and oriented. No focal neurological deficits. Extremities: Symmetric 5 x 5 power.    Data Reviewed: I have personally reviewed following labs and imaging studies  CBC: Recent Labs  Lab 03/14/24 1408 03/15/24 0525 03/16/24 0551 03/20/24 1317 03/21/24 0744  WBC 6.4 6.3 5.5 5.4 10.3  NEUTROABS  --   --   --  2.5  --   HGB 11.2* 9.5* 9.8* 10.4* 10.1*  HCT 32.5* 27.2* 27.5* 29.9* 28.3*  MCV 67.4* 67.2* 65.9* 66.6* 65.5*  PLT 161 135* 134* 159 156   Basic Metabolic Panel: Recent Labs  Lab 03/14/24 1408 03/15/24 0525 03/16/24 0551 03/20/24 1317 03/21/24 0744  NA 133* 133* 131* 133* 131*  K 3.7 3.7 3.2* 3.4* 3.7  CL 107 109 105 107 109  CO2 20* 21* 21* 18* 16*  GLUCOSE 146* 90 81 142* 258*  BUN 17 19 16  22* 30*  CREATININE 0.96 1.16 0.93 1.38* 1.56*  CALCIUM 8.0* 7.8* 7.8* 8.3* 8.1*  MG  --  1.9 1.9 2.0 1.9  PHOS  --  2.6 2.8  --  3.4   GFR: Estimated Creatinine Clearance: 70 mL/min (A) (by C-G formula based on SCr of 1.56 mg/dL (H)). Liver Function Tests: Recent Labs  Lab 03/14/24 1408 03/15/24 0525 03/20/24 1317 03/21/24 0744  AST 449* 364* 427* 247*  ALT 60* 46* 58* 37  ALKPHOS 194* 164* 149* 133*  BILITOT 15.4* 14.7* 20.1* 18.0*  PROT 6.7 6.3* 7.1 7.0  ALBUMIN  1.9* 1.9* 1.9* 1.6*   Recent Labs  Lab 03/14/24 1408  LIPASE 29   Recent Labs  Lab 03/20/24 1317 03/21/24 0744  AMMONIA 42* 86*   Coagulation Profile: Recent Labs  Lab 03/14/24 1705 03/15/24 1558 03/20/24 1317 03/21/24 0744  INR 1.4* 1.4* 1.6* 1.7*   Cardiac Enzymes: No results for input(s): "CKTOTAL", "CKMB", "CKMBINDEX", "TROPONINI" in the last 168 hours. BNP (last 3 results) No results for input(s): "PROBNP" in the last 8760 hours. HbA1C: No results for input(s): "HGBA1C" in the last 72 hours. CBG: Recent Labs  Lab 03/15/24 1648  03/20/24 1346 03/20/24 2119 03/21/24 0621 03/21/24 1137  GLUCAP 116* 128* 372* 239* 290*   Lipid Profile: No results for input(s): "CHOL", "HDL", "LDLCALC", "TRIG", "CHOLHDL", "LDLDIRECT" in the last 72 hours. Thyroid Function Tests: No results for input(s): "TSH", "T4TOTAL", "FREET4", "T3FREE", "THYROIDAB" in the last 72 hours. Anemia Panel: No results for input(s): "VITAMINB12", "FOLATE", "FERRITIN", "TIBC", "IRON", "RETICCTPCT" in the last 72 hours. Sepsis Labs: No results for input(s): "PROCALCITON", "LATICACIDVEN" in the last 168 hours.  No results found for this or any previous visit (from the past 240 hours).       Radiology Studies: US  Paracentesis Result Date: 03/21/2024 INDICATION: [id number] Ascites [id number] 48 year old male with new diagnosis of hepatic cirrhosis, with ascites and jaundice. IR was requested for diagnostic and therapeutic paracentesis. EXAM: ULTRASOUND GUIDED DIAGNOSTIC AND THERAPEUTIC PARACENTESIS MEDICATIONS: 7 cc of 1% lidocaine  COMPLICATIONS: None immediate. PROCEDURE: Informed written consent was obtained from the patient after a discussion of the risks, benefits and alternatives to treatment. A timeout was performed prior to the initiation of the procedure. Initial  ultrasound scanning demonstrates a small amount of ascites within the right lower abdominal quadrant. The right lower abdomen was prepped and draped in the usual sterile fashion. 1% lidocaine  was used for local anesthesia. Following this, a 6 Fr Safe-T-Centesis catheter was introduced. An ultrasound image was saved for documentation purposes. The paracentesis was performed. The catheter was removed and a dressing was applied. The patient tolerated the procedure well without immediate post procedural complication. FINDINGS: A total of approximately 600 mL of clear, golden yellow peritoneal fluid was removed. Samples were sent to the laboratory as requested by the clinical team. IMPRESSION: Successful  ultrasound-guided diagnostic and therapeutic paracentesis yielding 600 mL of peritoneal fluid. Procedure performed by Lambert Pillion, PA-C PLAN: If the patient eventually requires >/=2 paracenteses in a 30 day period, candidacy for formal evaluation by the [facility] will be assessed. Art Largo, MD [facility] Radiology Electronically Signed   By: Art Largo M.D.   On: 03/21/2024 11:42   EEG adult Result Date: 03/20/2024 Augustin Leber, MD     03/20/2024  5:47 PM History: 48 yo M with shaking concecrning for seizure Sedation: none Patient State: Awake and drowsy Technique: This EEG was acquired with electrodes placed according to the International 10-20 electrode system (including Fp1, Fp2, F3, F4, C3, C4, P3, P4, O1, O2, T3, T4, T5, T6, A1, A2, Fz, Cz, Pz). The following electrodes were missing or displaced: none. Background: The background consists of intermixed alpha and beta activities. There is a well defined posterior dominant rhythm of 9 Hz that attenuates with eye opening. There is mild anterior shifting of PDR with drowsiness. Photic stimulation: Physiologic driving is present EEG Abnormalities: none Clinical Interpretation: This normal EEG is recorded in the waking and drowsy state. There was no seizure or seizure predisposition recorded on this study. Please note that lack of epileptiform activity on EEG does not preclude the possibility of epilepsy. Ann Keto, MD [facility] If 7pm- 7am, please page neurology on call as listed in AMION.  DG Chest 2 View Result Date: 03/20/2024 CLINICAL DATA:  Shortness of breath.  Seizure. EXAM: CHEST - 2 VIEW COMPARISON:  Chest radiograph July 18, 2022 FINDINGS: Stable cardiomegaly. Mild elevation right hemidiaphragm. No large area pulmonary consolidation. No pleural effusion or pneumothorax. Osseous structures unremarkable.  IMPRESSION: Cardiomegaly. No acute cardiopulmonary process. Electronically Signed   By: Jone Neither M.D.   On: 03/20/2024 17:29   MR Brain W and Wo Contrast Result Date: 03/20/2024 CLINICAL DATA:  Seizure. EXAM: MRI HEAD WITHOUT AND WITH CONTRAST TECHNIQUE: Multiplanar, multiecho pulse sequences of the brain and surrounding structures were obtained without and with intravenous contrast. CONTRAST:  10mL GADAVIST  GADOBUTROL  1 MMOL/ML IV SOLN COMPARISON:  MR head without contrast 02/17/2024 FINDINGS: Brain: No acute infarct, hemorrhage, or mass lesion is present. Periventricular and subcortical T2 hyperintensities bilaterally are moderately advanced for age. The ventricles are of normal size. Deep brain nuclei are within normal limits. The brainstem and cerebellum are within normal limits. The internal auditory canals are within normal limits. Midline structures are within normal limits. Postcontrast images demonstrate no pathologic enhancement. Vascular: Flow is present in the major intracranial arteries. Skull and upper cervical spine: The craniocervical junction is normal. Upper cervical spine is within normal limits. Marrow signal is unremarkable. Sinuses/Orbits: Polyps or mucous retention cysts are again noted in the right maxillary sinus. The paranasal sinuses and mastoid air cells are otherwise clear. The globes and orbits are  within normal limits. IMPRESSION: 1. No acute intracranial abnormality or significant interval change. 2. Periventricular and subcortical T2 hyperintensities bilaterally are moderately advanced for age. The finding is nonspecific but can be seen in the setting of chronic microvascular ischemia, a demyelinating process such as multiple sclerosis, vasculitis, complicated migraine headaches, or as the sequelae of a prior infectious or inflammatory process. Electronically Signed   By: Audree Leas M.D.   On: 03/20/2024 17:18        Scheduled Meds:  doxazosin   2 mg Oral QHS    furosemide   40 mg Intravenous BID   hydrochlorothiazide   25 mg Oral Daily   insulin  aspart  0-5 Units Subcutaneous QHS   insulin  aspart  0-9 Units Subcutaneous TID WC   lactulose   20 g Oral BID   lidocaine  (PF)  10 mL Intradermal Once   Continuous Infusions:  albumin  human       LOS: 1 day    Time spent: 50 minutes    Vada Garibaldi, MD [facility]

## 2024-03-21 NOTE — Procedures (Signed)
 PROCEDURE SUMMARY:  Successful image-guided paracentesis from the right abdomen.  Yielded 0.6 liters of clear, golden yellow fluid.  No immediate complications.  EBL: zero Patient tolerated well.   Specimen was sent for labs.  Please see imaging section of Epic for full dictation.  Gordy Lauber Lark Langenfeld PA-C 03/21/2024 11:27 AM

## 2024-03-21 NOTE — Plan of Care (Signed)

## 2024-03-22 DIAGNOSIS — K7200 Acute and subacute hepatic failure without coma: Secondary | ICD-10-CM | POA: Diagnosis not present

## 2024-03-22 LAB — GLUCOSE, CAPILLARY
Glucose-Capillary: 192 mg/dL — ABNORMAL HIGH (ref 70–99)
Glucose-Capillary: 209 mg/dL — ABNORMAL HIGH (ref 70–99)
Glucose-Capillary: 174 mg/dL — ABNORMAL HIGH (ref 70–99)
Glucose-Capillary: 315 mg/dL — ABNORMAL HIGH (ref 70–99)

## 2024-03-22 LAB — COMPREHENSIVE METABOLIC PANEL WITH GFR
ALT: 26 U/L (ref 0–44)
AST: 113 U/L — ABNORMAL HIGH (ref 15–41)
Albumin: 1.9 g/dL — ABNORMAL LOW (ref 3.5–5.0)
Alkaline Phosphatase: 118 U/L (ref 38–126)
Anion gap: 7 (ref 5–15)
BUN: 37 mg/dL — ABNORMAL HIGH (ref 6–20)
CO2: 18 mmol/L — ABNORMAL LOW (ref 22–32)
Calcium: 8.5 mg/dL — ABNORMAL LOW (ref 8.9–10.3)
Chloride: 109 mmol/L (ref 98–111)
Creatinine, Ser: 1.91 mg/dL — ABNORMAL HIGH (ref 0.61–1.24)
GFR, Estimated: 43 mL/min — ABNORMAL LOW (ref >60)
Glucose, Bld: 193 mg/dL — ABNORMAL HIGH (ref 70–99)
Potassium: 3.5 mmol/L (ref 3.5–5.1)
Sodium: 134 mmol/L — ABNORMAL LOW (ref 135–145)
Total Bilirubin: 15.4 mg/dL — ABNORMAL HIGH (ref 0.0–1.2)
Total Protein: 6.4 g/dL — ABNORMAL LOW (ref 6.5–8.1)

## 2024-03-22 LAB — CREATININE, URINE, RANDOM: Creatinine, Urine: 131 mg/dL

## 2024-03-22 LAB — PH, BODY FLUID: pH, Body Fluid: 7.6

## 2024-03-22 LAB — SODIUM, URINE, RANDOM: Sodium, Ur: 20 mmol/L

## 2024-03-22 NOTE — Progress Notes (Signed)
 PROGRESS NOTE    Sean Foster  WUJ:[id number] DOB: [birth year] DOA: 03/20/2024 PCP: [name], PA-C    Brief Narrative:  48 y.o. male with medical history significant of right-sided retinal detachment followed by ophthalmology, essential hypertension, hyperlipidemia, type 2 diabetes with recently known A1c of 6.6 on glipizide, chronic combined heart failure with known ejection fraction 40 to 45%, moderate to severe mitral regurgitation, cirrhosis of liver thought to be chronic hepatitis B with recent multiple investigations, recent admission to the hospital with nausea vomiting and right upper quadrant abdominal pain and discharged on 4/22 with suggested outpatient follow-up presented back to the emergency room with ongoing abdominal discomfort, right upper quadrant discomfort ongoing and worsening for last few days, progressively worsening abdominal distention that he thinks he is retaining fluid, poor appetite.    ED Course: Patient neurologically stable.  Alert awake and oriented.  Vitals are stable. Renal function test, slight elevated creatinine of 1.38. LFTs with AST/ALT 427/58 at about his chronic levels Bilirubin 20, it was 15 about a week ago. No evidence of tremors or asterixis. Case discussed with neurology by ER physician.  Spot EEG was without any abnormalities.  Admitted for management of cirrhosis and ascites and abdominal pain. Patient complained of shortness of breath and he was given a dose of Solu-Medrol and nebulizer.  Subjective:  Patient seen and examined.  Subjectively feels better.  Abdominal pain has improved.  He thinks his urine is clearing up. Urine output is not measured. Patient was given IV Lasix  40 mg x 2 and albumin  12.5 mg x 4 yesterday.  Creatinine is 1.92. He is defecating too much, x 5 last night.  Will discontinue lactulose .  Will avoid IV hydration.  Case discussed with nephrology further recommendation.   Assessment & Plan:   Subacute  liver failure, decompensated with ascites causing abdominal pain and discomfort: Extensively investigated in the past.  Supposed to follow-up with hepatology at [facility].  Currently does not have any evidence of fulminant hepatic failure. Status post paracentesis,  transudate.  No evidence of infection.. Holding lactulose  today, very frequent bowel movements. Ammonia levels are normal.  PT/INR 1.7. Holding diuretics today to allow volume expansion. Advised patient to call his hepatologist to schedule an appointment.  Suspected seizure: Ruled out.  Seen by neurology.  Atypical presentation.  EEG without evidence of seizure activities.  Seizure precautions.  Less likely seizure episodes.   Type 2 diabetes: Poorly controlled.  Patient is on glipizide and metformin  at home.  Currently on sliding scale insulin .   Essential hypertension blood pressure is stable.  Resume doxazosin .  Hold hydrochlorothiazide .  Electrolytes adequate today.   Acute kidney injury: Likely multifactorial.  Most likely volume depletion.  Rule out hepatorenal syndrome.  Encourage oral intake.  Holding diuretics.  Recheck tomorrow morning. Discontinue lactulose .  Holding Lasix  and hydrochlorothiazide . No evidence of urinary retention on recent scan. Strict intake output measurement.  Hold diuretics.  Encourage oral intake.  Recheck tomorrow morning.   Continue to mobilize.  Monitor renal functions.  Anticipate home tomorrow.   DVT prophylaxis: SCDs Start: 03/20/24 2038   Code Status: Full code Family Communication: None at the bedside Disposition Plan: Status is: Inpatient Remains inpatient appropriate because: Significant symptoms, abnormal renal functions.     Consultants:  Nephrology  Procedures:  Paracentesis  Antimicrobials:  None     Objective: Vitals:   03/21/24 1958 03/22/24 0011 03/22/24 0345 03/22/24 0822  BP: (!) 162/88 138/67 132/69 (!) 158/94  Pulse: 70 (!)  57 63 (!) 57  Resp: 18  18 18 19   Temp: 98 F (36.7 C) 98.3 F (36.8 C) 98 F (36.7 C) 98.1 F (36.7 C)  TempSrc: Oral Oral Oral Oral  SpO2: 100% 99% 99% 100%  Weight:      Height:        Intake/Output Summary (Last 24 hours) at 03/22/2024 1045 Last data filed at 03/22/2024 0600 Gross per 24 hour  Intake 350.79 ml  Output --  Net 350.79 ml   Filed Weights   03/20/24 1312  Weight: 102 kg    Examination:  General exam: Appears calm and comfortable.  Alert awake oriented.  Eating breakfast. Respiratory system: Clear to auscultation. Respiratory effort normal. Cardiovascular system: S1 & S2 heard, RRR.  Gastrointestinal system: Nondistended..  No fluid thrill.  Bowel sounds present.  Nontender. Central nervous system: Alert and oriented. No focal neurological deficits. Extremities: Symmetric 5 x 5 power.    Data Reviewed: I have personally reviewed following labs and imaging studies  CBC: Recent Labs  Lab 03/16/24 0551 03/20/24 1317 03/21/24 0744  WBC 5.5 5.4 10.3  NEUTROABS  --  2.5  --   HGB 9.8* 10.4* 10.1*  HCT 27.5* 29.9* 28.3*  MCV 65.9* 66.6* 65.5*  PLT 134* 159 156   Basic Metabolic Panel: Recent Labs  Lab 03/16/24 0551 03/20/24 1317 03/21/24 0744 03/22/24 0615  NA 131* 133* 131* 134*  K 3.2* 3.4* 3.7 3.5  CL 105 107 109 109  CO2 21* 18* 16* 18*  GLUCOSE 81 142* 258* 193*  BUN 16 22* 30* 37*  CREATININE 0.93 1.38* 1.56* 1.91*  CALCIUM 7.8* 8.3* 8.1* 8.5*  MG 1.9 2.0 1.9  --   PHOS 2.8  --  3.4  --    GFR: Estimated Creatinine Clearance: 57.2 mL/min (A) (by C-G formula based on SCr of 1.91 mg/dL (H)). Liver Function Tests: Recent Labs  Lab 03/20/24 1317 03/21/24 0744 03/22/24 0615  AST 427* 247* 113*  ALT 58* 37 26  ALKPHOS 149* 133* 118  BILITOT 20.1* 18.0* 15.4*  PROT 7.1 7.0 6.4*  ALBUMIN  1.9* 1.6* 1.9*   No results for input(s): "LIPASE", "AMYLASE" in the last 168 hours.  Recent Labs  Lab 03/20/24 1317 03/21/24 0744  AMMONIA 42* 86*    Coagulation Profile: Recent Labs  Lab 03/15/24 1558 03/20/24 1317 03/21/24 0744  INR 1.4* 1.6* 1.7*   Cardiac Enzymes: No results for input(s): "CKTOTAL", "CKMB", "CKMBINDEX", "TROPONINI" in the last 168 hours. BNP (last 3 results) No results for input(s): "PROBNP" in the last 8760 hours. HbA1C: No results for input(s): "HGBA1C" in the last 72 hours. CBG: Recent Labs  Lab 03/21/24 0621 03/21/24 1137 03/21/24 1615 03/21/24 2128 03/22/24 0614  GLUCAP 239* 290* 277* 249* 192*   Lipid Profile: No results for input(s): "CHOL", "HDL", "LDLCALC", "TRIG", "CHOLHDL", "LDLDIRECT" in the last 72 hours. Thyroid Function Tests: No results for input(s): "TSH", "T4TOTAL", "FREET4", "T3FREE", "THYROIDAB" in the last 72 hours. Anemia Panel: No results for input(s): "VITAMINB12", "FOLATE", "FERRITIN", "TIBC", "IRON", "RETICCTPCT" in the last 72 hours. Sepsis Labs: No results for input(s): "PROCALCITON", "LATICACIDVEN" in the last 168 hours.  Recent Results (from the past 240 hours)  Culture, body fluid w Gram Stain-bottle     Status: None (Preliminary result)   Collection Time: 03/21/24 10:23 AM   Specimen: Peritoneal Washings  Result Value Ref Range Status   Specimen Description PERITONEAL  Final   Special Requests NONE  Final   Culture  Final    NO GROWTH < 24 HOURS Performed at [facility], [address] [place], [place] [address]    Report Status PENDING  Incomplete  Gram stain     Status: None   Collection Time: 03/21/24 10:23 AM   Specimen: Peritoneal Washings  Result Value Ref Range Status   Specimen Description PERITONEAL  Final   Special Requests NONE  Final   Gram Stain   Final    NO WBC SEEN NO ORGANISMS SEEN Performed at [facility], [address] [place], [place] [address]    Report Status 03/21/2024 FINAL  Final         Radiology Studies: US  Paracentesis Result Date: 03/21/2024 INDICATION: [id number] Ascites [id number] 48 year old male with  new diagnosis of hepatic cirrhosis, with ascites and jaundice. IR was requested for diagnostic and therapeutic paracentesis. EXAM: ULTRASOUND GUIDED DIAGNOSTIC AND THERAPEUTIC PARACENTESIS MEDICATIONS: 7 cc of 1% lidocaine  COMPLICATIONS: None immediate. PROCEDURE: Informed written consent was obtained from the patient after a discussion of the risks, benefits and alternatives to treatment. A timeout was performed prior to the initiation of the procedure. Initial ultrasound scanning demonstrates a small amount of ascites within the right lower abdominal quadrant. The right lower abdomen was prepped and draped in the usual sterile fashion. 1% lidocaine  was used for local anesthesia. Following this, a 6 Fr Safe-T-Centesis catheter was introduced. An ultrasound image was saved for documentation purposes. The paracentesis was performed. The catheter was removed and a dressing was applied. The patient tolerated the procedure well without immediate post procedural complication. FINDINGS: A total of approximately 600 mL of clear, golden yellow peritoneal fluid was removed. Samples were sent to the laboratory as requested by the clinical team. IMPRESSION: Successful ultrasound-guided diagnostic and therapeutic paracentesis yielding 600 mL of peritoneal fluid. Procedure performed by Lambert Pillion, PA-C PLAN: If the patient eventually requires >/=2 paracenteses in a 30 day period, candidacy for formal evaluation by the [facility] will be assessed. Art Largo, MD [facility] Radiology Electronically Signed   By: Art Largo M.D.   On: 03/21/2024 11:42   EEG adult Result Date: 03/20/2024 Augustin Leber, MD     03/20/2024  5:47 PM History: 48 yo M with shaking concecrning for seizure Sedation: none Patient State: Awake and drowsy Technique: This EEG was acquired with electrodes placed according to the International  10-20 electrode system (including Fp1, Fp2, F3, F4, C3, C4, P3, P4, O1, O2, T3, T4, T5, T6, A1, A2, Fz, Cz, Pz). The following electrodes were missing or displaced: none. Background: The background consists of intermixed alpha and beta activities. There is a well defined posterior dominant rhythm of 9 Hz that attenuates with eye opening. There is mild anterior shifting of PDR with drowsiness. Photic stimulation: Physiologic driving is present EEG Abnormalities: none Clinical Interpretation: This normal EEG is recorded in the waking and drowsy state. There was no seizure or seizure predisposition recorded on this study. Please note that lack of epileptiform activity on EEG does not preclude the possibility of epilepsy. Ann Keto, MD [facility] If 7pm- 7am, please page neurology on call as listed in AMION.  DG Chest 2 View Result Date: 03/20/2024 CLINICAL DATA:  Shortness of breath.  Seizure. EXAM: CHEST - 2 VIEW COMPARISON:  Chest radiograph July 18, 2022 FINDINGS: Stable cardiomegaly. Mild elevation right hemidiaphragm. No large area pulmonary consolidation. No pleural effusion or pneumothorax. Osseous structures unremarkable. IMPRESSION: Cardiomegaly.  No acute cardiopulmonary process. Electronically Signed   By: Jone Neither M.D.   On: 03/20/2024 17:29   MR Brain W and Wo Contrast Result Date: 03/20/2024 CLINICAL DATA:  Seizure. EXAM: MRI HEAD WITHOUT AND WITH CONTRAST TECHNIQUE: Multiplanar, multiecho pulse sequences of the brain and surrounding structures were obtained without and with intravenous contrast. CONTRAST:  10mL GADAVIST  GADOBUTROL  1 MMOL/ML IV SOLN COMPARISON:  MR head without contrast 02/17/2024 FINDINGS: Brain: No acute infarct, hemorrhage, or mass lesion is present. Periventricular and subcortical T2 hyperintensities bilaterally are moderately advanced for age. The ventricles are of normal size. Deep brain nuclei are within normal limits. The brainstem and cerebellum  are within normal limits. The internal auditory canals are within normal limits. Midline structures are within normal limits. Postcontrast images demonstrate no pathologic enhancement. Vascular: Flow is present in the major intracranial arteries. Skull and upper cervical spine: The craniocervical junction is normal. Upper cervical spine is within normal limits. Marrow signal is unremarkable. Sinuses/Orbits: Polyps or mucous retention cysts are again noted in the right maxillary sinus. The paranasal sinuses and mastoid air cells are otherwise clear. The globes and orbits are within normal limits. IMPRESSION: 1. No acute intracranial abnormality or significant interval change. 2. Periventricular and subcortical T2 hyperintensities bilaterally are moderately advanced for age. The finding is nonspecific but can be seen in the setting of chronic microvascular ischemia, a demyelinating process such as multiple sclerosis, vasculitis, complicated migraine headaches, or as the sequelae of a prior infectious or inflammatory process. Electronically Signed   By: Audree Leas M.D.   On: 03/20/2024 17:18        Scheduled Meds:  doxazosin   2 mg Oral QHS   insulin  aspart  0-5 Units Subcutaneous QHS   insulin  aspart  0-9 Units Subcutaneous TID WC   lidocaine  (PF)  10 mL Intradermal Once   Continuous Infusions:     LOS: 2 days    Time spent: 50 minutes    Vada Garibaldi, MD [facility]

## 2024-03-22 NOTE — Progress Notes (Signed)
 Discussed with pt that MD ordered Strict I/O's, daily weight, a urine sample and a PVR.  Instructed pt to use the urinal for all voids so that staff could measure urine accurately and to ring for this RN after next void so that he can be bladder scanned.  Pt verbalized understanding, but he is sleepy.  This RN to reinforce all information discussed.

## 2024-03-22 NOTE — Inpatient Diabetes Management (Signed)
 Inpatient Diabetes Program Recommendations  AACE/ADA: New Consensus Statement on Inpatient Glycemic Control (2015)  Target Ranges:  Prepandial:   less than 140 mg/dL      Peak postprandial:   less than 180 mg/dL (1-2 hours)      Critically ill patients:  140 - 180 mg/dL    Latest Reference Range & Units 03/21/24 06:21 03/21/24 11:37 03/21/24 16:15 03/21/24 21:28  Glucose-Capillary 70 - 99 mg/dL 811 (H) 914 (H) 782 (H) 249 (H)  (H): Data is abnormally high  Latest Reference Range & Units 03/22/24 06:14 03/22/24 12:19  Glucose-Capillary 70 - 99 mg/dL 956 (H) 213 (H)  (H): Data is abnormally high       Home Meds: None--Diet Controlled  Current Orders: Novolog  0-9 units TID ac/hs   MD- Note CBGs remain elevated.  No steroids since 04/26.  If CBGs remain >200, please consider adding low dose/weight based basal insulin  for in-hospital glucose control: Semglee  5 units daily (0.05 units/kg)    --Will follow patient during hospitalization--  Langston Pippins RN, MSN, CDCES Diabetes Coordinator Inpatient Glycemic Control Team Team Pager: [phone] (8a-5p)

## 2024-03-22 NOTE — Plan of Care (Signed)

## 2024-03-22 NOTE — Consult Note (Signed)
 Reason for Consult: AKI, possible HRS Referring Physician: Vada Garibaldi, MD  Chief Complaint: Abdominal pain  Assessment/Plan: AKI - Likely multifactorial, most suspicious for prerenal component given decrease PO intake in the past week and excess stool output from lactulose . Possible ATN component given contrast exposure and ?chronic NSAID use. Will obtain urine studies, strict I&Os and daily weights. Bladder scan x1 to obtain PVR but lower concern for obstructive process given normal CTAP. Less likely HRS given other probable etiology. No indication for dialysis at this time. Recommend PO fluid replacement and consider reduction of lactulose  given stool output. Cirrhosis - Symptomatically improved with paracentesis and lactulose . Agree with OP hepatology f/u T2DM - Controlled. Per Primary HTN - Mildly hypertensive. Agree with holding hydrochlorothiazide      HPI: Sean Foster is an 48 y.o. 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  Now s/p paracentesis 0.6L fluid now symptomatically improved with less abdominal distension. Reports persistent BMs, states he has gone at least 6 times by 10AM. States prior to admission he has decreased appetite and was not urinating much at all for a couple days. Denies any SOB.  Baseline SCr between 0.8-1.1, mildly elevated at SCr 1.38 upon admission. UA significant for proteinuria upon admission. Received Lasix  40 mg IV and albumin  given suspicion of underlying hepatorenal syndrome. UOP not well documented, 3 unmeasured voids recorded.  Holding home hydrochlorothiazide , mildly hypertensive. Scr trended up to 1.91, Nephrology consulted.  Denies chronic kidney disease prior to admission, does not see nephrology outpatient.  Family history significant for "jaundice" but denies any relatives on  dialysis.  Reports daily ibuprofen  use, states he takes "3 pills" unsure of dosage.   Recently hospitalized from 4/20 to 4/22 for similar presentation of N/V and abdominal pain. CTAP with contrast on 4/20 without hydronephrosis and unremarkable bladder. 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 Patient planning to f/u with Hepatology OP to discuss transplant.   ROS Pertinent items are noted in HPI.  Chemistry and CBC: Creatinine, Ser  Date/Time Value Ref Range Status  03/22/2024 06:15 AM 1.91 (H) 0.61 - 1.24 mg/dL Final  16/08/9603 54:09 AM 1.56 (H) 0.61 - 1.24 mg/dL Final  81/19/1478 29:56 PM 1.38 (H) 0.61 - 1.24 mg/dL Final    Comment:    ICTERUS AT THIS LEVEL MAY AFFECT RESULT  03/16/2024 05:51 AM 0.93 0.61 - 1.24 mg/dL Final  21/30/8657 84:69 AM 1.16 0.61 - 1.24 mg/dL Final  62/95/2841 32:44 PM 0.96 0.61 - 1.24 mg/dL Final  11/27/7251 66:44 PM 1.27 (H) 0.61 - 1.24 mg/dL Final  03/47/4259 56:38 AM 0.94 0.61 - 1.24 mg/dL Final  75/64/3329 51:88 PM 1.11 0.61 - 1.24 mg/dL Final  41/66/0630 16:01 AM 0.85 0.61 - 1.24 mg/dL Final  09/32/3557 32:20 AM 0.86 0.61 - 1.24 mg/dL Final  25/42/7062 37:62 AM 0.83 0.61 - 1.24 mg/dL Final  83/15/1761 60:73 AM 0.73 0.61 - 1.24 mg/dL Final  71/04/2693 85:46 AM 0.94 0.61 - 1.24 mg/dL Final  27/01/5008 38:18 AM 0.90 0.61 - 1.24 mg/dL Final  29/93/7169 67:89 AM 0.78 0.61 - 1.24 mg/dL Final  38/08/1750 02:58 AM 0.90 0.61 - 1.24 mg/dL Final  52/77/8242 35:36 AM 1.01 0.61 - 1.24 mg/dL Final  14/43/1540 08:67 AM 0.80 0.61 - 1.24 mg/dL Final  61/95/0932 67:12 AM 0.94 0.61 - 1.24 mg/dL Final   Recent Labs  Lab 03/16/24 0551 03/20/24 1317 03/21/24 0744 03/22/24 0615  NA 131* 133* 131* 134*  K 3.2* 3.4* 3.7 3.5  CL 105 107 109 109  CO2 21* 18* 16* 18*  GLUCOSE 81 142* 258* 193*  BUN 16 22* 30* 37*  CREATININE 0.93 1.38* 1.56* 1.91*  CALCIUM 7.8* 8.3* 8.1* 8.5*  PHOS 2.8  --  3.4  --    Recent Labs  Lab 03/16/24 0551 03/20/24 1317  03/21/24 0744  WBC 5.5 5.4 10.3  NEUTROABS  --  2.5  --   HGB 9.8* 10.4* 10.1*  HCT 27.5* 29.9* 28.3*  MCV 65.9* 66.6* 65.5*  PLT 134* 159 156   Liver Function Tests: Recent Labs  Lab 03/20/24 1317 03/21/24 0744 03/22/24 0615  AST 427* 247* 113*  ALT 58* 37 26  ALKPHOS 149* 133* 118  BILITOT 20.1* 18.0* 15.4*  PROT 7.1 7.0 6.4*  ALBUMIN  1.9* 1.6* 1.9*   No results for input(s): "LIPASE", "AMYLASE" in the last 168 hours. Recent Labs  Lab 03/20/24 1317 03/21/24 0744  AMMONIA 42* 86*   Cardiac Enzymes: No results for input(s): "CKTOTAL", "CKMB", "CKMBINDEX", "TROPONINI" in the last 168 hours. Iron Studies: No results for input(s): "IRON", "TIBC", "TRANSFERRIN", "FERRITIN" in the last 72 hours. PT/INR: @LABRCNTIP (inr:5)  Xrays/Other Studies: ) Results for orders placed or performed during the hospital encounter of 03/20/24 (from the past 48 hours)  Comprehensive metabolic panel     Status: Abnormal   Collection Time: 03/20/24  1:17 PM  Result Value Ref Range   Sodium 133 (L) 135 - 145 mmol/L   Potassium 3.4 (L) 3.5 - 5.1 mmol/L   Chloride 107 98 - 111 mmol/L   CO2 18 (L) 22 - 32 mmol/L   Glucose, Bld 142 (H) 70 - 99 mg/dL    Comment: Glucose reference range applies only to samples taken after fasting for at least 8 hours.   BUN 22 (H) 6 - 20 mg/dL   Creatinine, Ser 0.98 (H) 0.61 - 1.24 mg/dL    Comment: ICTERUS AT THIS LEVEL MAY AFFECT RESULT   Calcium 8.3 (L) 8.9 - 10.3 mg/dL   Total Protein 7.1 6.5 - 8.1 g/dL   Albumin  1.9 (L) 3.5 - 5.0 g/dL   AST 119 (H) 15 - 41 U/L   ALT 58 (H) 0 - 44 U/L   Alkaline Phosphatase 149 (H) 38 - 126 U/L   Total Bilirubin 20.1 (HH) 0.0 - 1.2 mg/dL    Comment: CRITICAL RESULT CALLED TO, READ BACK BY AND VERIFIED WITH Feliciana Horn RN , @1425 , 03/20/24, [place],T.   GFR, Estimated >60 >60 mL/min    Comment: (NOTE) Calculated using the CKD-EPI Creatinine Equation (2021)    Anion gap 8 5 - 15    Comment: Performed at [facility]  Lab, [address], [place], [place] [address]  CBC with Differential/Platelet     Status: Abnormal   Collection Time: 03/20/24  1:17 PM  Result Value Ref Range   WBC 5.4 4.0 - 10.5 K/uL   RBC 4.49 4.22 - 5.81 MIL/uL   Hemoglobin 10.4 (L) 13.0 - 17.0 g/dL   HCT 95.6 (L) 21.3 - 08.6 %   MCV 66.6 (L) 80.0 - 100.0 fL   MCH 23.2 (L) 26.0 - 34.0 pg   MCHC 34.8 30.0 - 36.0 g/dL   RDW 57.8 (H) 46.9 - 62.9 %   Platelets 159 150 - 400 K/uL    Comment: REPEATED TO VERIFY   nRBC 0.0 0.0 -  0.2 %   Neutrophils Relative % 45 %   Neutro Abs 2.5 1.7 - 7.7 K/uL   Lymphocytes Relative 41 %   Lymphs Abs 2.2 0.7 - 4.0 K/uL   Monocytes Relative 10 %   Monocytes Absolute 0.5 0.1 - 1.0 K/uL   Eosinophils Relative 2 %   Eosinophils Absolute 0.1 0.0 - 0.5 K/uL   Basophils Relative 1 %   Basophils Absolute 0.0 0.0 - 0.1 K/uL   WBC Morphology MORPHOLOGY UNREMARKABLE    RBC Morphology See Note    Smear Review Normal platelet morphology    Immature Granulocytes 1 %   Abs Immature Granulocytes 0.03 0.00 - 0.07 K/uL   Polychromasia PRESENT    Target Cells PRESENT     Comment: Performed at [facility] Lab, [address], [place], [place] [address]  Magnesium     Status: None   Collection Time: 03/20/24  1:17 PM  Result Value Ref Range   Magnesium 2.0 1.7 - 2.4 mg/dL    Comment: Performed at [facility] Lab, [address], [place], [place] [address]  Protime-INR     Status: Abnormal   Collection Time: 03/20/24  1:17 PM  Result Value Ref Range   Prothrombin Time 18.9 (H) 11.4 - 15.2 seconds   INR 1.6 (H) 0.8 - 1.2    Comment: (NOTE) INR goal varies based on device and disease states. Performed at [facility] Lab, [address], [place], [place] [address]   Ammonia     Status: Abnormal   Collection Time: 03/20/24  1:17 PM  Result Value Ref Range   Ammonia 42 (H) 9 - 35 umol/L    Comment: HEMOLYSIS AT THIS LEVEL MAY AFFECT RESULT Performed at [facility] Lab, [address],  [place], [place] [address]   Ethanol     Status: None   Collection Time: 03/20/24  1:17 PM  Result Value Ref Range   Alcohol, Ethyl (B) <15 <15 mg/dL    Comment: Please note change in reference range. (NOTE) For medical purposes only. Performed at [facility] Lab, [address] [place], [place] [address]   Brain natriuretic peptide     Status: Abnormal   Collection Time: 03/20/24  1:17 PM  Result Value Ref Range   B Natriuretic Peptide 138.1 (H) 0.0 - 100.0 pg/mL    Comment: ICTERUS AT THIS LEVEL MAY AFFECT RESULT Performed at [facility] Lab, [address], [place], [place] [address]   CBG monitoring, ED     Status: Abnormal   Collection Time: 03/20/24  1:46 PM  Result Value Ref Range   Glucose-Capillary 128 (H) 70 - 99 mg/dL    Comment: Glucose reference range applies only to samples taken after fasting for at least 8 hours.  Urinalysis, Routine w reflex microscopic -Urine, Clean Catch     Status: Abnormal   Collection Time: 03/20/24  5:30 PM  Result Value Ref Range   Color, Urine AMBER (A) YELLOW    Comment: BIOCHEMICALS MAY BE AFFECTED BY COLOR   APPearance CLEAR CLEAR   Specific Gravity, Urine 1.029 1.005 - 1.030   pH 5.0 5.0 - 8.0   Glucose, UA NEGATIVE NEGATIVE mg/dL   Hgb urine dipstick SMALL (A) NEGATIVE   Bilirubin Urine MODERATE (A) NEGATIVE   Ketones, ur NEGATIVE NEGATIVE mg/dL   Protein, ur >=578 (A) NEGATIVE mg/dL   Nitrite NEGATIVE NEGATIVE   Leukocytes,Ua NEGATIVE NEGATIVE   RBC / HPF 6-10  0 - 5 RBC/hpf   WBC, UA 0-5 0 - 5 WBC/hpf   Bacteria, UA NONE SEEN NONE SEEN   Squamous Epithelial / HPF 0-5 0 - 5 /HPF   Mucus PRESENT     Comment: Performed at [facility] Lab, [address], [place], [place] [address]  Glucose, capillary     Status: Abnormal   Collection Time: 03/20/24  9:19 PM  Result Value Ref Range   Glucose-Capillary 372 (H) 70 - 99 mg/dL    Comment: Glucose reference range applies only to samples taken after fasting for at least 8  hours.   Comment 1 Notify RN   Glucose, capillary     Status: Abnormal   Collection Time: 03/21/24  6:21 AM  Result Value Ref Range   Glucose-Capillary 239 (H) 70 - 99 mg/dL    Comment: Glucose reference range applies only to samples taken after fasting for at least 8 hours.   Comment 1 Notify RN   Comprehensive metabolic panel     Status: Abnormal   Collection Time: 03/21/24  7:44 AM  Result Value Ref Range   Sodium 131 (L) 135 - 145 mmol/L   Potassium 3.7 3.5 - 5.1 mmol/L   Chloride 109 98 - 111 mmol/L   CO2 16 (L) 22 - 32 mmol/L   Glucose, Bld 258 (H) 70 - 99 mg/dL    Comment: Glucose reference range applies only to samples taken after fasting for at least 8 hours.   BUN 30 (H) 6 - 20 mg/dL   Creatinine, Ser 2.13 (H) 0.61 - 1.24 mg/dL   Calcium 8.1 (L) 8.9 - 10.3 mg/dL   Total Protein 7.0 6.5 - 8.1 g/dL   Albumin  1.6 (L) 3.5 - 5.0 g/dL   AST 086 (H) 15 - 41 U/L   ALT 37 0 - 44 U/L   Alkaline Phosphatase 133 (H) 38 - 126 U/L   Total Bilirubin 18.0 (H) 0.0 - 1.2 mg/dL   GFR, Estimated 55 (L) >60 mL/min    Comment: (NOTE) Calculated using the CKD-EPI Creatinine Equation (2021)    Anion gap 6 5 - 15    Comment: Performed at [facility] Lab, [address], [place], [place] [address]  CBC     Status: Abnormal   Collection Time: 03/21/24  7:44 AM  Result Value Ref Range   WBC 10.3 4.0 - 10.5 K/uL   RBC 4.32 4.22 - 5.81 MIL/uL   Hemoglobin 10.1 (L) 13.0 - 17.0 g/dL   HCT 96.2 (L) 95.2 - 84.1 %   MCV 65.5 (L) 80.0 - 100.0 fL   MCH 23.4 (L) 26.0 - 34.0 pg   MCHC 35.7 30.0 - 36.0 g/dL   RDW 32.4 (H) 40.1 - 02.7 %   Platelets 156 150 - 400 K/uL    Comment: REPEATED TO VERIFY   nRBC 0.0 0.0 - 0.2 %    Comment: Performed at [facility] Lab, [address] [place], [place] [address]  Protime-INR     Status: Abnormal   Collection Time: 03/21/24  7:44 AM  Result Value Ref Range   Prothrombin Time 20.1 (H) 11.4 - 15.2 seconds   INR 1.7 (H) 0.8 - 1.2    Comment:  (NOTE) INR goal varies based on device and disease states. Performed at [facility] Lab, [address], [place], [place] [address]   Ammonia     Status: Abnormal   Collection Time: 03/21/24  7:44 AM  Result  Value Ref Range   Ammonia 86 (H) 9 - 35 umol/L    Comment: Performed at [facility] Lab, [address] [place], [place] [address]  Magnesium     Status: None   Collection Time: 03/21/24  7:44 AM  Result Value Ref Range   Magnesium 1.9 1.7 - 2.4 mg/dL    Comment: Performed at [facility] Lab, [address], [place], [place] [address]  Phosphorus     Status: None   Collection Time: 03/21/24  7:44 AM  Result Value Ref Range   Phosphorus 3.4 2.5 - 4.6 mg/dL    Comment: Performed at [facility] Lab, [address], [place], [place] [address]  Lactate dehydrogenase (pleural or peritoneal fluid)     Status: Abnormal   Collection Time: 03/21/24 10:21 AM  Result Value Ref Range   LD, Fluid 27 (H) 3 - 23 U/L    Comment: (NOTE) Results should be evaluated in conjunction with serum values    Fluid Type-FLDH PERITONEAL FLUID CYTO     Comment: Performed at [facility] Lab, [address] [place], [place] [address] CORRECTED ON 04/27 AT 1044: PREVIOUSLY REPORTED AS ABDOMEN   Body fluid cell count with differential     Status: Abnormal   Collection Time: 03/21/24 10:21 AM  Result Value Ref Range   Fluid Type-FCT PERITONEAL FLUID CYTO     Comment: CORRECTED ON 04/27 AT 1044: PREVIOUSLY REPORTED AS ABDOMEN   Color, Fluid YELLOW YELLOW   Appearance, Fluid CLEAR (A) CLEAR   Total Nucleated Cell Count, Fluid 38 0 - 1,000 cu mm   Neutrophil Count, Fluid 5 0 - 25 %   Lymphs, Fluid 37 %   Monocyte-Macrophage-Serous Fluid 58 50 - 90 %   Eos, Fluid 0 %   Other Cells, Fluid OTHER CELLS IDENTIFIED AS MESOTHELIAL CELLS %    Comment: Performed at [facility] Lab, [address], [place], [place] [address]  Albumin , pleural or peritoneal fluid      Status: None   Collection  Time: 03/21/24 10:21 AM  Result Value Ref Range   Albumin , Fluid <1.5 g/dL    Comment: (NOTE) No normal range established for this test Results should be evaluated in conjunction with serum values    Fluid Type-FALB PERITONEAL FLUID CYTO     Comment: Performed at [facility] Lab, [address] [place], [place] [address] CORRECTED ON 04/27 AT 1044: PREVIOUSLY REPORTED AS ABDOMEN   Protein, pleural or peritoneal fluid     Status: None   Collection Time: 03/21/24 10:21 AM  Result Value Ref Range   Total protein, fluid <3.0 g/dL    Comment: (NOTE) No normal range established for this test Results should be evaluated in conjunction with serum values    Fluid Type-FTP PERITONEAL FLUID CYTO     Comment: Performed at [facility] Lab, [address] [place], [place] [address] CORRECTED ON 04/27 AT 1044: PREVIOUSLY REPORTED AS ABDOMEN   PH, Body Fluid     Status: None   Collection Time: 03/21/24 10:21 AM  Result Value Ref Range   pH, Body Fluid 7.6 Not Estab.    Comment: (NOTE) This test was developed and its performance characteristics determined by [facility]. It has not been cleared or approved by the Food and Drug Administration. The reference interval(s) and other method performance specifications have not been established for this body fluid. The test result must be integrated into the clinical context  for interpretation. Performed At: [facility] [address] [place], [place] [id number] Pearlean Botts MD [id number]    Source PERITONEAL FLUID     Comment: Performed at [facility] Lab, [address], [place], [place] [address]  Culture, body fluid w Gram Stain-bottle     Status: None (Preliminary result)   Collection Time: 03/21/24 10:23 AM   Specimen: Peritoneal Washings  Result Value Ref Range   Specimen Description PERITONEAL    Special Requests NONE    Culture      NO GROWTH < 24 HOURS Performed at [facility] Lab, [address],  [place], [place] [address]    Report Status PENDING   Gram stain     Status: None   Collection Time: 03/21/24 10:23 AM   Specimen: Peritoneal Washings  Result Value Ref Range   Specimen Description PERITONEAL    Special Requests NONE    Gram Stain      NO WBC SEEN NO ORGANISMS SEEN Performed at [facility] Lab, [address] [place], [place] [address]    Report Status 03/21/2024 FINAL   Glucose, capillary     Status: Abnormal   Collection Time: 03/21/24 11:37 AM  Result Value Ref Range   Glucose-Capillary 290 (H) 70 - 99 mg/dL    Comment: Glucose reference range applies only to samples taken after fasting for at least 8 hours.   Comment 1 Notify RN    Comment 2 Document in Chart   Glucose, capillary     Status: Abnormal   Collection Time: 03/21/24  4:15 PM  Result Value Ref Range   Glucose-Capillary 277 (H) 70 - 99 mg/dL    Comment: Glucose reference range applies only to samples taken after fasting for at least 8 hours.   Comment 1 Notify RN    Comment 2 Document in Chart   Glucose, capillary     Status: Abnormal   Collection Time: 03/21/24  9:28 PM  Result Value Ref Range   Glucose-Capillary 249 (H) 70 - 99 mg/dL    Comment: Glucose reference range applies only to samples taken after fasting for at least 8 hours.  Glucose, capillary     Status: Abnormal   Collection Time: 03/22/24  6:14 AM  Result Value Ref Range   Glucose-Capillary 192 (H) 70 - 99 mg/dL    Comment: Glucose reference range applies only to samples taken after fasting for at least 8 hours.   Comment 1 Notify RN    Comment 2 Document in Chart   Comprehensive metabolic panel with GFR     Status: Abnormal   Collection Time: 03/22/24  6:15 AM  Result Value Ref Range   Sodium 134 (L) 135 - 145 mmol/L   Potassium 3.5 3.5 - 5.1 mmol/L   Chloride 109 98 - 111 mmol/L   CO2 18 (L) 22 - 32 mmol/L   Glucose, Bld 193 (H) 70 - 99 mg/dL    Comment: Glucose reference range applies only to samples taken after fasting  for at least 8 hours.   BUN 37 (H) 6 - 20 mg/dL   Creatinine, Ser 3.24 (H) 0.61 - 1.24 mg/dL   Calcium 8.5 (L) 8.9 - 10.3 mg/dL   Total Protein 6.4 (L) 6.5 - 8.1 g/dL   Albumin  1.9 (L) 3.5 - 5.0 g/dL   AST 401 (H) 15 - 41 U/L   ALT 26 0 - 44 U/L   Alkaline Phosphatase 118 38 - 126 U/L  Total Bilirubin 15.4 (H) 0.0 - 1.2 mg/dL   GFR, Estimated 43 (L) >60 mL/min    Comment: (NOTE) Calculated using the CKD-EPI Creatinine Equation (2021)    Anion gap 7 5 - 15    Comment: Performed at [facility] Lab, [address], [place], [place] [address]   US  Paracentesis Result Date: 03/21/2024 INDICATION: [id number] Ascites [id number] 48 year old male with new diagnosis of hepatic cirrhosis, with ascites and jaundice. IR was requested for diagnostic and therapeutic paracentesis. EXAM: ULTRASOUND GUIDED DIAGNOSTIC AND THERAPEUTIC PARACENTESIS MEDICATIONS: 7 cc of 1% lidocaine  COMPLICATIONS: None immediate. PROCEDURE: Informed written consent was obtained from the patient after a discussion of the risks, benefits and alternatives to treatment. A timeout was performed prior to the initiation of the procedure. Initial ultrasound scanning demonstrates a small amount of ascites within the right lower abdominal quadrant. The right lower abdomen was prepped and draped in the usual sterile fashion. 1% lidocaine  was used for local anesthesia. Following this, a 6 Fr Safe-T-Centesis catheter was introduced. An ultrasound image was saved for documentation purposes. The paracentesis was performed. The catheter was removed and a dressing was applied. The patient tolerated the procedure well without immediate post procedural complication. FINDINGS: A total of approximately 600 mL of clear, golden yellow peritoneal fluid was removed. Samples were sent to the laboratory as requested by the clinical team. IMPRESSION: Successful ultrasound-guided diagnostic and therapeutic paracentesis yielding 600 mL of peritoneal fluid. Procedure  performed by Lambert Pillion, PA-C PLAN: If the patient eventually requires >/=2 paracenteses in a 30 day period, candidacy for formal evaluation by the [facility] will be assessed. Art Largo, MD [facility] Radiology Electronically Signed   By: Art Largo M.D.   On: 03/21/2024 11:42   EEG adult Result Date: 03/20/2024 Augustin Leber, MD     03/20/2024  5:47 PM History: 48 yo M with shaking concecrning for seizure Sedation: none Patient State: Awake and drowsy Technique: This EEG was acquired with electrodes placed according to the International 10-20 electrode system (including Fp1, Fp2, F3, F4, C3, C4, P3, P4, O1, O2, T3, T4, T5, T6, A1, A2, Fz, Cz, Pz). The following electrodes were missing or displaced: none. Background: The background consists of intermixed alpha and beta activities. There is a well defined posterior dominant rhythm of 9 Hz that attenuates with eye opening. There is mild anterior shifting of PDR with drowsiness. Photic stimulation: Physiologic driving is present EEG Abnormalities: none Clinical Interpretation: This normal EEG is recorded in the waking and drowsy state. There was no seizure or seizure predisposition recorded on this study. Please note that lack of epileptiform activity on EEG does not preclude the possibility of epilepsy. Ann Keto, MD [facility] If 7pm- 7am, please page neurology on call as listed in AMION.  DG Chest 2 View Result Date: 03/20/2024 CLINICAL DATA:  Shortness of breath.  Seizure. EXAM: CHEST - 2 VIEW COMPARISON:  Chest radiograph July 18, 2022 FINDINGS: Stable cardiomegaly. Mild elevation right hemidiaphragm. No large area pulmonary consolidation. No pleural effusion or pneumothorax. Osseous structures unremarkable. IMPRESSION: Cardiomegaly. No acute cardiopulmonary process. Electronically Signed   By: Jone Neither M.D.   On:  03/20/2024 17:29   MR Brain W and Wo Contrast Result Date: 03/20/2024 CLINICAL DATA:  Seizure. EXAM: MRI HEAD WITHOUT AND WITH CONTRAST TECHNIQUE: Multiplanar, multiecho pulse sequences of the brain and surrounding structures were obtained without and with intravenous contrast. CONTRAST:  10mL GADAVIST  GADOBUTROL   1 MMOL/ML IV SOLN COMPARISON:  MR head without contrast 02/17/2024 FINDINGS: Brain: No acute infarct, hemorrhage, or mass lesion is present. Periventricular and subcortical T2 hyperintensities bilaterally are moderately advanced for age. The ventricles are of normal size. Deep brain nuclei are within normal limits. The brainstem and cerebellum are within normal limits. The internal auditory canals are within normal limits. Midline structures are within normal limits. Postcontrast images demonstrate no pathologic enhancement. Vascular: Flow is present in the major intracranial arteries. Skull and upper cervical spine: The craniocervical junction is normal. Upper cervical spine is within normal limits. Marrow signal is unremarkable. Sinuses/Orbits: Polyps or mucous retention cysts are again noted in the right maxillary sinus. The paranasal sinuses and mastoid air cells are otherwise clear. The globes and orbits are within normal limits. IMPRESSION: 1. No acute intracranial abnormality or significant interval change. 2. Periventricular and subcortical T2 hyperintensities bilaterally are moderately advanced for age. The finding is nonspecific but can be seen in the setting of chronic microvascular ischemia, a demyelinating process such as multiple sclerosis, vasculitis, complicated migraine headaches, or as the sequelae of a prior infectious or inflammatory process. Electronically Signed   By: Audree Leas M.D.   On: 03/20/2024 17:18    PMH:   Past Medical History:  Diagnosis Date   Diabetes mellitus without complication (HCC)    Hypertension     PSH:   Past Surgical History:  Procedure  Laterality Date   INCISION AND DRAINAGE ABSCESS Left 04/16/2020   Procedure: INCISION AND DRAINAGE BUTTOCK ABSCESS;  Surgeon: Oralee Billow, MD;  Location: [facility]S;  Service: General;  Laterality: Left;   RETINAL DETACHMENT SURGERY Right 03/08/2024    Allergies:  Allergies  Allergen Reactions   Chloroquine Itching   Chlorothen Hives, Itching and Other (See Comments)    "Chlorothen (trade name Forensic scientist) is an antihistamine and anticholinergic"    Medications:   Prior to Admission medications   Medication Sig Start Date End Date Taking? Authorizing Provider  atorvastatin (LIPITOR) 40 MG tablet Take 40 mg by mouth daily.   Yes [provider]  doxazosin  (CARDURA ) 2 MG tablet Take 2 mg by mouth at bedtime. 01/14/24  Yes [provider]  hydrALAZINE  (APRESOLINE ) 25 MG tablet Take 25 mg by mouth 3 (three) times daily.   Yes [provider]  hydrochlorothiazide  (HYDRODIURIL ) 25 MG tablet Take 1 tablet (25 mg total) by mouth daily. 02/20/23 03/20/25 Yes Trish Furl, MD  lactulose  (CHRONULAC ) 10 GM/15ML solution Take 15 mLs (10 g total) by mouth daily as needed for mild constipation. 03/16/24  Yes Magdalene School, MD  prednisoLONE acetate (PRED FORTE) 1 % ophthalmic suspension Place 1 drop into the right eye 2 (two) times daily. 03/16/24  Yes [provider]  amLODipine  (NORVASC ) 5 MG tablet Take 1 tablet (5 mg total) by mouth daily. Patient not taking: Reported on 06/16/2019 02/24/18 06/16/19  Bart Born, MD    Discontinued Meds:   Medications Discontinued During This Encounter  Medication Reason   lactulose , encephalopathy, (CHRONULAC ) 10 GM/15ML SOLN Duplicate   hydrochlorothiazide  (HYDRODIURIL ) tablet 25 mg    furosemide  (LASIX ) injection 40 mg    lactulose  (CHRONULAC ) 10 GM/15ML solution 20 g     Social History:  reports that he has never smoked. He has never used smokeless tobacco. He reports that he does not currently use alcohol. He reports that he does  not currently use drugs.  Family History:  History reviewed. No pertinent family history.  Blood pressure (!) 158/94,  pulse (!) 57, temperature 98.1 F (36.7 C), temperature source Oral, resp. rate 19, height 5\' 10"  (1.778 m), weight 102 kg, SpO2 100%. General appearance: alert and cooperative Eyes: positive findings: Icteric sclera Resp: clear to auscultation bilaterally and normal WOB on RA Cardio: regular rate and rhythm, S1, S2 normal, no murmur, click, rub or gallop GI: abnormal findings:  Soft, mild distension, non-tender to palpation. +BS Extremities: Trace peripheral edema     Jonne Netters, DO [facility], PGY-2 03/22/2024, 9:41 AM

## 2024-03-22 NOTE — TOC Initial Note (Signed)
 Transition of Care [facility]) - Initial/Assessment Note    Patient Details  Name: Sean Foster MRN: [id number] Date of Birth: 02-09-1976  [facility]) CM/SW Contact:    Jonathan Neighbor, RN Phone Number: 03/22/2024, 4:22 PM  Clinical Narrative:                  Pt is from home with his son that works during the daytime.  Son provides transportation. Pt manages his own medications and denies issues.  No DME.   SDOH Interventions Today    Flowsheet Row Most Recent Value  SDOH Interventions   Food Insecurity Interventions [id number] Referral, Inpatient TOC  Transportation Interventions Inpatient TOC, [id number] Referral  Utilities Interventions [id number] Referral, Inpatient TOC        Expected Discharge Plan: Home/Self Care Barriers to Discharge: Continued Medical Work up   Patient Goals and CMS Choice            Expected Discharge Plan and Services   Discharge Planning Services: CM Consult   Living arrangements for the past 2 months: Apartment                                      Prior Living Arrangements/Services Living arrangements for the past 2 months: Apartment Lives with:: Adult Children Patient language and need for interpreter reviewed:: Yes Do you feel safe going back to the place where you live?: Yes            Criminal Activity/Legal Involvement Pertinent to Current Situation/Hospitalization: No - Comment as needed  Activities of Daily Living   ADL Screening (condition at time of admission) Independently performs ADLs?: Yes (appropriate for developmental age) Is the patient deaf or have difficulty hearing?: No Does the patient have difficulty seeing, even when wearing glasses/contacts?: No Does the patient have difficulty concentrating, remembering, or making decisions?: No  Permission Sought/Granted                  Emotional Assessment Appearance:: Appears stated age   Affect (typically observed): Flat Orientation:  : Oriented to Self, Oriented to Place, Oriented to  Time, Oriented to Situation   Psych Involvement: No (comment)  Admission diagnosis:  Hepatorenal syndrome (HCC) [K76.7] Shortness of breath [R06.02] Seizure (HCC) [R56.9] Other ascites [R18.8] Seizure-like activity (HCC) [R56.9] Patient Active Problem List   Diagnosis Date Noted   Seizure (HCC) 03/20/2024   Subacute liver failure 03/20/2024   Retinal detachment 03/20/2024   Essential hypertension 03/20/2024   Chronic hepatitis B (HCC) 03/20/2024   Intractable abdominal pain 03/14/2024   Left buttock abscess 04/16/2020   Diabetes (HCC) 04/16/2020   Cellulitis of left buttock 04/16/2020   PCP:  [name], PA-C Pharmacy:   [facility] [phone] Jonette Nestle, [place] - [address] AT [facility] [address] [place] [address] Phone: [phone] Fax: [phone]  [id number] - [place], [place] - [address] AT [facility] [address] [place] [address] Phone: [phone] Fax: [phone]     Social Drivers of Health (SDOH) Social History: SDOH Screenings   Food Insecurity: Food Insecurity Present (03/21/2024)  Housing: High Risk (03/21/2024)  Transportation Needs: Unmet Transportation Needs (03/21/2024)  Utilities: At Risk (03/21/2024)  Tobacco Use: Low Risk  (03/20/2024)  Recent Concern: Tobacco Use - High Risk (03/11/2024)   Received from  [facility]   SDOH Interventions:     Readmission Risk Interventions    03/16/2024   10:24 AM  Readmission Risk Prevention Plan  Transportation Screening Complete  PCP or Specialist Appt within 5-7 Days Complete  Home Care Screening Complete  Medication Review (RN CM) Complete

## 2024-03-22 NOTE — Plan of Care (Signed)
  Problem: Education: Goal: Ability to describe self-care measures that may prevent or decrease complications (Diabetes Survival Skills Education) will improve Outcome: Progressing   Problem: Coping: Goal: Ability to adjust to condition or change in health will improve Outcome: Progressing   Problem: Fluid Volume: Goal: Ability to maintain a balanced intake and output will improve Outcome: Progressing   Problem: Health Behavior/Discharge Planning: Goal: Ability to identify and utilize available resources and services will improve Outcome: Progressing Goal: Ability to manage health-related needs will improve Outcome: Progressing   Problem: Metabolic: Goal: Ability to maintain appropriate glucose levels will improve Outcome: Progressing   Problem: Nutritional: Goal: Maintenance of adequate nutrition will improve Outcome: Progressing   Problem: Skin Integrity: Goal: Risk for impaired skin integrity will decrease Outcome: Progressing   Problem: Tissue Perfusion: Goal: Adequacy of tissue perfusion will improve Outcome: Progressing   Problem: Education: Goal: Knowledge of General Education information will improve Description: Including pain rating scale, medication(s)/side effects and non-pharmacologic comfort measures Outcome: Progressing   Problem: Health Behavior/Discharge Planning: Goal: Ability to manage health-related needs will improve Outcome: Progressing   Problem: Clinical Measurements: Goal: Ability to maintain clinical measurements within normal limits will improve Outcome: Progressing Goal: Will remain free from infection Outcome: Progressing Goal: Diagnostic test results will improve Outcome: Progressing Goal: Cardiovascular complication will be avoided Outcome: Progressing   Problem: Activity: Goal: Risk for activity intolerance will decrease Outcome: Progressing   Problem: Nutrition: Goal: Adequate nutrition will be maintained Outcome:  Progressing   Problem: Coping: Goal: Level of anxiety will decrease Outcome: Progressing   Problem: Elimination: Goal: Will not experience complications related to bowel motility Outcome: Progressing Goal: Will not experience complications related to urinary retention Outcome: Progressing   Problem: Skin Integrity: Goal: Risk for impaired skin integrity will decrease Outcome: Progressing

## 2024-03-22 NOTE — Plan of Care (Signed)
  Problem: Education: Goal: Ability to describe self-care measures that may prevent or decrease complications (Diabetes Survival Skills Education) will improve Outcome: Progressing   Problem: Fluid Volume: Goal: Ability to maintain a balanced intake and output will improve Outcome: Progressing   Problem: Elimination: Goal: Will not experience complications related to bowel motility Outcome: Progressing   Problem: Pain Managment: Goal: General experience of comfort will improve and/or be controlled Outcome: Progressing

## 2024-03-23 ENCOUNTER — Other Ambulatory Visit (HOSPITAL_COMMUNITY): Payer: Self-pay

## 2024-03-23 DIAGNOSIS — K7200 Acute and subacute hepatic failure without coma: Secondary | ICD-10-CM | POA: Diagnosis not present

## 2024-03-23 LAB — GLUCOSE, CAPILLARY
Glucose-Capillary: 99 mg/dL (ref 70–99)
Glucose-Capillary: 153 mg/dL — ABNORMAL HIGH (ref 70–99)

## 2024-03-23 LAB — COMPREHENSIVE METABOLIC PANEL WITH GFR
ALT: 24 U/L (ref 0–44)
AST: 104 U/L — ABNORMAL HIGH (ref 15–41)
Albumin: 1.9 g/dL — ABNORMAL LOW (ref 3.5–5.0)
Alkaline Phosphatase: 129 U/L — ABNORMAL HIGH (ref 38–126)
Anion gap: 7 (ref 5–15)
BUN: 40 mg/dL — ABNORMAL HIGH (ref 6–20)
CO2: 20 mmol/L — ABNORMAL LOW (ref 22–32)
Calcium: 8.2 mg/dL — ABNORMAL LOW (ref 8.9–10.3)
Chloride: 108 mmol/L (ref 98–111)
Creatinine, Ser: 1.67 mg/dL — ABNORMAL HIGH (ref 0.61–1.24)
GFR, Estimated: 50 mL/min — ABNORMAL LOW (ref >60)
Glucose, Bld: 99 mg/dL (ref 70–99)
Potassium: 3.5 mmol/L (ref 3.5–5.1)
Sodium: 135 mmol/L (ref 135–145)
Total Bilirubin: 14.5 mg/dL — ABNORMAL HIGH (ref 0.0–1.2)
Total Protein: 6.2 g/dL — ABNORMAL LOW (ref 6.5–8.1)

## 2024-03-23 MED ORDER — METFORMIN HCL 500 MG PO TABS
500.0000 mg | ORAL_TABLET | Freq: Two times a day (BID) | ORAL | 0 refills | Status: DC
  Filled 2024-03-23: qty 180, 90d supply, fill #0

## 2024-03-23 MED ORDER — ATORVASTATIN CALCIUM 40 MG PO TABS
40.0000 mg | ORAL_TABLET | Freq: Every day | ORAL | 2 refills | Status: AC
  Filled 2024-03-23: qty 30, 30d supply, fill #0

## 2024-03-23 MED ORDER — HYDROCHLOROTHIAZIDE 25 MG PO TABS
25.0000 mg | ORAL_TABLET | Freq: Every day | ORAL | 0 refills | Status: DC
  Filled 2024-03-23: qty 90, 90d supply, fill #0

## 2024-03-23 MED ORDER — DOXAZOSIN MESYLATE 2 MG PO TABS
2.0000 mg | ORAL_TABLET | Freq: Every day | ORAL | 0 refills | Status: AC
  Filled 2024-03-23: qty 90, 90d supply, fill #0

## 2024-03-23 MED ORDER — HYDRALAZINE HCL 25 MG PO TABS
25.0000 mg | ORAL_TABLET | Freq: Three times a day (TID) | ORAL | 0 refills | Status: AC
  Filled 2024-03-23: qty 90, 30d supply, fill #0

## 2024-03-23 NOTE — Progress Notes (Signed)
 Reason for Consult: AKI, possible HRS Referring Physician: Vada Garibaldi, MD   Chief Complaint: Abdominal pain   Assessment/Plan: AKI - Scr improved  (1.91 > 1.67) and good UOP with p.o. intake and d/c of lactulose . Presentation most likely secondary to intravascular depletion, anticipate will continue to improve. No indication for dialysis and nephrology signing off at this time Cirrhosis - Appears to be at baseline volume status. OP hepatology f/u T2DM - Controlled. Per Primary HTN - Mildly hypertensive. Restart hydrochlorothiazide  OP.   HPI: Sean Foster is an 48 y.o. 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  Feels well, good appetite and intake.  Denies any further BM since having lactulose  yesterday.  Reports normal urine output.  Denies shortness of breath or worsening abdominal fullness.  Good UOP with documented and 3 other undocumented voids in the past 24 hours. PVR . FENA 0.2%, indicative of prerenal etiology but likely not useful.  ROS Pertinent items are noted in HPI.  Chemistry and CBC: Creatinine, Ser  Date/Time Value Ref Range Status  03/23/2024 05:28 AM 1.67 (H) 0.61 - 1.24 mg/dL Final  78/46/9629 52:84 AM 1.91 (H) 0.61 - 1.24 mg/dL Final  13/24/4010 27:25 AM 1.56 (H) 0.61 - 1.24 mg/dL Final  36/64/4034 74:25 PM 1.38 (H) 0.61 - 1.24 mg/dL Final    Comment:    ICTERUS AT THIS LEVEL MAY AFFECT RESULT  03/16/2024 05:51 AM 0.93 0.61 - 1.24 mg/dL Final  95/63/8756 43:32 AM 1.16 0.61 - 1.24 mg/dL Final  95/18/8416 60:63 PM 0.96 0.61 - 1.24 mg/dL Final  01/60/1093 23:55 PM 1.27 (H) 0.61 - 1.24 mg/dL Final  73/22/0254 27:06 AM 0.94 0.61 - 1.24 mg/dL Final  23/76/2831 51:76 PM 1.11 0.61 - 1.24 mg/dL Final  16/05/3709 62:69 AM 0.85 0.61 - 1.24 mg/dL Final  48/54/6270 35:00 AM 0.86 0.61 - 1.24 mg/dL Final  93/81/8299 37:16 AM 0.83 0.61 - 1.24 mg/dL Final  96/78/9381 01:75 AM 0.73  0.61 - 1.24 mg/dL Final  09/18/8526 78:24 AM 0.94 0.61 - 1.24 mg/dL Final  23/53/6144 31:54 AM 0.90 0.61 - 1.24 mg/dL Final  00/86/7619 50:93 AM 0.78 0.61 - 1.24 mg/dL Final  26/71/2458 09:98 AM 0.90 0.61 - 1.24 mg/dL Final  33/82/5053 97:67 AM 1.01 0.61 - 1.24 mg/dL Final  34/19/3790 24:09 AM 0.80 0.61 - 1.24 mg/dL Final  73/53/2992 42:68 AM 0.94 0.61 - 1.24 mg/dL Final   Recent Labs  Lab 03/20/24 1317 03/21/24 0744 03/22/24 0615 03/23/24 0528  NA 133* 131* 134* 135  K 3.4* 3.7 3.5 3.5  CL 107 109 109 108  CO2 18* 16* 18* 20*  GLUCOSE 142* 258* 193* 99  BUN 22* 30* 37* 40*  CREATININE 1.38* 1.56* 1.91* 1.67*  CALCIUM 8.3* 8.1* 8.5* 8.2*  PHOS  --  3.4  --   --    Recent Labs  Lab 03/20/24 1317 03/21/24 0744  WBC 5.4 10.3  NEUTROABS 2.5  --   HGB 10.4* 10.1*  HCT 29.9* 28.3*  MCV 66.6* 65.5*  PLT 159 156   Liver Function Tests: Recent Labs  Lab 03/21/24 0744 03/22/24 0615 03/23/24 0528  AST 247* 113* 104*  ALT 37 26 24  ALKPHOS 133* 118 129*  BILITOT 18.0* 15.4* 14.5*  PROT 7.0 6.4* 6.2*  ALBUMIN  1.6* 1.9* 1.9*   No results for input(s): "LIPASE", "AMYLASE" in the last 168 hours. Recent Labs  Lab 03/20/24 1317 03/21/24 0744  AMMONIA 42*  86*   Cardiac Enzymes: No results for input(s): "CKTOTAL", "CKMB", "CKMBINDEX", "TROPONINI" in the last 168 hours. Iron Studies: No results for input(s): "IRON", "TIBC", "TRANSFERRIN", "FERRITIN" in the last 72 hours. PT/INR: @LABRCNTIP (inr:5)  Xrays/Other Studies: ) Results for orders placed or performed during the hospital encounter of 03/20/24 (from the past 48 hours)  Lactate dehydrogenase (pleural or peritoneal fluid)     Status: Abnormal   Collection Time: 03/21/24 10:21 AM  Result Value Ref Range   LD, Fluid 27 (H) 3 - 23 U/L    Comment: (NOTE) Results should be evaluated in conjunction with serum values    Fluid Type-FLDH PERITONEAL FLUID CYTO     Comment: Performed at [facility], [address], [place], [place] [address] CORRECTED ON 04/27 AT 1044: PREVIOUSLY REPORTED AS ABDOMEN   Body fluid cell count with differential     Status: Abnormal   Collection Time: 03/21/24 10:21 AM  Result Value Ref Range   Fluid Type-FCT PERITONEAL FLUID CYTO     Comment: CORRECTED ON 04/27 AT 1044: PREVIOUSLY REPORTED AS ABDOMEN   Color, Fluid YELLOW YELLOW   Appearance, Fluid CLEAR (A) CLEAR   Total Nucleated Cell Count, Fluid 38 0 - 1,000 cu mm   Neutrophil Count, Fluid 5 0 - 25 %   Lymphs, Fluid 37 %   Monocyte-Macrophage-Serous Fluid 58 50 - 90 %   Eos, Fluid 0 %   Other Cells, Fluid OTHER CELLS IDENTIFIED AS MESOTHELIAL CELLS %    Comment: Performed at [facility], [address], [place], [place] [address]  Albumin , pleural or peritoneal fluid      Status: None   Collection Time: 03/21/24 10:21 AM  Result Value Ref Range   Albumin , Fluid <1.5 g/dL    Comment: (NOTE) No normal range established for this test Results should be evaluated in conjunction with serum values    Fluid Type-FALB PERITONEAL FLUID CYTO     Comment: Performed at [facility], [address] [place], [place] [address] CORRECTED ON 04/27 AT 1044: PREVIOUSLY REPORTED AS ABDOMEN   Protein, pleural or peritoneal fluid     Status: None   Collection Time: 03/21/24 10:21 AM  Result Value Ref Range   Total protein, fluid <3.0 g/dL    Comment: (NOTE) No normal range established for this test Results should be evaluated in conjunction with serum values    Fluid Type-FTP PERITONEAL FLUID CYTO     Comment: Performed at [facility], [address], [place], [place] [address] CORRECTED ON 04/27 AT 1044: PREVIOUSLY REPORTED AS ABDOMEN   PH, Body Fluid     Status: None   Collection Time: 03/21/24 10:21 AM  Result Value Ref Range   pH, Body Fluid 7.6 Not Estab.    Comment: (NOTE) This test was developed and its performance characteristics determined by [facility]. It has not been cleared or  approved by the Food and Drug Administration. The reference interval(s) and other method performance specifications have not been established for this body fluid. The test result must be integrated into the clinical context for interpretation. Performed At: [facility] [address] [place], [place] [id number] Pearlean Botts MD LK:[id number]    Source PERITONEAL FLUID     Comment: Performed at [facility], [address] [place], [place] [address]  Culture, body fluid w Gram Stain-bottle     Status: None (Preliminary result)   Collection Time: 03/21/24 10:23 AM  Specimen: Peritoneal Washings  Result Value Ref Range   Specimen Description PERITONEAL    Special Requests NONE    Culture      NO GROWTH 2 DAYS Performed at [facility], [address], [place], [place] [address]    Report Status PENDING   Gram stain     Status: None   Collection Time: 03/21/24 10:23 AM   Specimen: Peritoneal Washings  Result Value Ref Range   Specimen Description PERITONEAL    Special Requests NONE    Gram Stain      NO WBC SEEN NO ORGANISMS SEEN Performed at [facility], [address], [place], [place] [address]    Report Status 03/21/2024 FINAL   Glucose, capillary     Status: Abnormal   Collection Time: 03/21/24 11:37 AM  Result Value Ref Range   Glucose-Capillary 290 (H) 70 - 99 mg/dL    Comment: Glucose reference range applies only to samples taken after fasting for at least 8 hours.   Comment 1 Notify RN    Comment 2 Document in Chart   Glucose, capillary     Status: Abnormal   Collection Time: 03/21/24  4:15 PM  Result Value Ref Range   Glucose-Capillary 277 (H) 70 - 99 mg/dL    Comment: Glucose reference range applies only to samples taken after fasting for at least 8 hours.   Comment 1 Notify RN    Comment 2 Document in Chart   Glucose, capillary     Status: Abnormal   Collection Time: 03/21/24  9:28 PM  Result Value Ref Range   Glucose-Capillary  249 (H) 70 - 99 mg/dL    Comment: Glucose reference range applies only to samples taken after fasting for at least 8 hours.  Glucose, capillary     Status: Abnormal   Collection Time: 03/22/24  6:14 AM  Result Value Ref Range   Glucose-Capillary 192 (H) 70 - 99 mg/dL    Comment: Glucose reference range applies only to samples taken after fasting for at least 8 hours.   Comment 1 Notify RN    Comment 2 Document in Chart   Comprehensive metabolic panel with GFR     Status: Abnormal   Collection Time: 03/22/24  6:15 AM  Result Value Ref Range   Sodium 134 (L) 135 - 145 mmol/L   Potassium 3.5 3.5 - 5.1 mmol/L   Chloride 109 98 - 111 mmol/L   CO2 18 (L) 22 - 32 mmol/L   Glucose, Bld 193 (H) 70 - 99 mg/dL    Comment: Glucose reference range applies only to samples taken after fasting for at least 8 hours.   BUN 37 (H) 6 - 20 mg/dL   Creatinine, Ser 0.09 (H) 0.61 - 1.24 mg/dL   Calcium 8.5 (L) 8.9 - 10.3 mg/dL   Total Protein 6.4 (L) 6.5 - 8.1 g/dL   Albumin  1.9 (L) 3.5 - 5.0 g/dL   AST 381 (H) 15 - 41 U/L   ALT 26 0 - 44 U/L   Alkaline Phosphatase 118 38 - 126 U/L   Total Bilirubin 15.4 (H) 0.0 - 1.2 mg/dL   GFR, Estimated 43 (L) >60 mL/min    Comment: (NOTE) Calculated using the CKD-EPI Creatinine Equation (2021)    Anion gap 7 5 - 15    Comment: Performed at [facility], [address] [place], [place] [address]  Glucose, capillary     Status: Abnormal   Collection  Time: 03/22/24 12:19 PM  Result Value Ref Range   Glucose-Capillary 209 (H) 70 - 99 mg/dL    Comment: Glucose reference range applies only to samples taken after fasting for at least 8 hours.   Comment 1 Notify RN   Sodium, urine, random     Status: None   Collection Time: 03/22/24  3:08 PM  Result Value Ref Range   Sodium, Ur 20 mmol/L    Comment: Performed at [facility], [address], [place], [place] [address]  Creatinine, urine, random     Status: None   Collection Time: 03/22/24  3:08 PM   Result Value Ref Range   Creatinine, Urine 131 mg/dL    Comment: Performed at [facility], [address] [place], [place] [address]  Glucose, capillary     Status: Abnormal   Collection Time: 03/22/24  4:16 PM  Result Value Ref Range   Glucose-Capillary 174 (H) 70 - 99 mg/dL    Comment: Glucose reference range applies only to samples taken after fasting for at least 8 hours.   Comment 1 Notify RN   Glucose, capillary     Status: Abnormal   Collection Time: 03/22/24  9:04 PM  Result Value Ref Range   Glucose-Capillary 315 (H) 70 - 99 mg/dL    Comment: Glucose reference range applies only to samples taken after fasting for at least 8 hours.   Comment 1 Notify RN    Comment 2 Document in Chart   Comprehensive metabolic panel with GFR     Status: Abnormal   Collection Time: 03/23/24  5:28 AM  Result Value Ref Range   Sodium 135 135 - 145 mmol/L   Potassium 3.5 3.5 - 5.1 mmol/L   Chloride 108 98 - 111 mmol/L   CO2 20 (L) 22 - 32 mmol/L   Glucose, Bld 99 70 - 99 mg/dL    Comment: Glucose reference range applies only to samples taken after fasting for at least 8 hours.   BUN 40 (H) 6 - 20 mg/dL   Creatinine, Ser 0.98 (H) 0.61 - 1.24 mg/dL   Calcium 8.2 (L) 8.9 - 10.3 mg/dL   Total Protein 6.2 (L) 6.5 - 8.1 g/dL   Albumin  1.9 (L) 3.5 - 5.0 g/dL   AST 119 (H) 15 - 41 U/L   ALT 24 0 - 44 U/L   Alkaline Phosphatase 129 (H) 38 - 126 U/L   Total Bilirubin 14.5 (H) 0.0 - 1.2 mg/dL   GFR, Estimated 50 (L) >60 mL/min    Comment: (NOTE) Calculated using the CKD-EPI Creatinine Equation (2021)    Anion gap 7 5 - 15    Comment: Performed at [facility], [address], [place], [place] [address]  Glucose, capillary     Status: None   Collection Time: 03/23/24  6:07 AM  Result Value Ref Range   Glucose-Capillary 99 70 - 99 mg/dL    Comment: Glucose reference range applies only to samples taken after fasting for at least 8 hours.   Comment 1 Notify RN    Comment 2 Document  in Chart    US  Paracentesis Result Date: 03/21/2024 INDICATION: [id number] Ascites [id number] 48 year old male with new diagnosis of hepatic cirrhosis, with ascites and jaundice. IR was requested for diagnostic and therapeutic paracentesis. EXAM: ULTRASOUND GUIDED DIAGNOSTIC AND THERAPEUTIC PARACENTESIS MEDICATIONS: 7 cc of 1% lidocaine  COMPLICATIONS: None immediate. PROCEDURE: Informed written consent was obtained from the patient after a discussion of  the risks, benefits and alternatives to treatment. A timeout was performed prior to the initiation of the procedure. Initial ultrasound scanning demonstrates a small amount of ascites within the right lower abdominal quadrant. The right lower abdomen was prepped and draped in the usual sterile fashion. 1% lidocaine  was used for local anesthesia. Following this, a 6 Fr Safe-T-Centesis catheter was introduced. An ultrasound image was saved for documentation purposes. The paracentesis was performed. The catheter was removed and a dressing was applied. The patient tolerated the procedure well without immediate post procedural complication. FINDINGS: A total of approximately 600 mL of clear, golden yellow peritoneal fluid was removed. Samples were sent to the laboratory as requested by the clinical team. IMPRESSION: Successful ultrasound-guided diagnostic and therapeutic paracentesis yielding 600 mL of peritoneal fluid. Procedure performed by Lambert Pillion, PA-C PLAN: If the patient eventually requires >/=2 paracenteses in a 30 day period, candidacy for formal evaluation by the [facility] will be assessed. Art Largo, MD [facility] Radiology Electronically Signed   By: Art Largo M.D.   On: 03/21/2024 11:42    PMH:   Past Medical History:  Diagnosis Date   Diabetes mellitus without complication (HCC)    Hypertension     PSH:   Past Surgical History:  Procedure  Laterality Date   INCISION AND DRAINAGE ABSCESS Left 04/16/2020   Procedure: INCISION AND DRAINAGE BUTTOCK ABSCESS;  Surgeon: Oralee Billow, MD;  Location: [facility]S;  Service: General;  Laterality: Left;   RETINAL DETACHMENT SURGERY Right 03/08/2024    Allergies:  Allergies  Allergen Reactions   Chloroquine Itching   Chlorothen Hives, Itching and Other (See Comments)    "Chlorothen (trade name Forensic scientist) is an antihistamine and anticholinergic"    Medications:   Prior to Admission medications   Medication Sig Start Date End Date Taking? Authorizing Provider  atorvastatin (LIPITOR) 40 MG tablet Take 40 mg by mouth daily.   Yes [provider]  doxazosin  (CARDURA ) 2 MG tablet Take 2 mg by mouth at bedtime. 01/14/24  Yes [provider]  hydrALAZINE  (APRESOLINE ) 25 MG tablet Take 25 mg by mouth 3 (three) times daily.   Yes [provider]  hydrochlorothiazide  (HYDRODIURIL ) 25 MG tablet Take 1 tablet (25 mg total) by mouth daily. 02/20/23 03/20/25 Yes Trish Furl, MD  lactulose  (CHRONULAC ) 10 GM/15ML solution Take 15 mLs (10 g total) by mouth daily as needed for mild constipation. 03/16/24  Yes Magdalene School, MD  prednisoLONE acetate (PRED FORTE) 1 % ophthalmic suspension Place 1 drop into the right eye 2 (two) times daily. 03/16/24  Yes [provider]  amLODipine  (NORVASC ) 5 MG tablet Take 1 tablet (5 mg total) by mouth daily. Patient not taking: Reported on 06/16/2019 02/24/18 06/16/19  Bart Born, MD    Discontinued Meds:   Medications Discontinued During This Encounter  Medication Reason   lactulose , encephalopathy, (CHRONULAC ) 10 GM/15ML SOLN Duplicate   hydrochlorothiazide  (HYDRODIURIL ) tablet 25 mg    furosemide  (LASIX ) injection 40 mg    lactulose  (CHRONULAC ) 10 GM/15ML solution 20 g     Social History:  reports that he has never smoked. He has never used smokeless tobacco. He reports that he does not currently use alcohol. He reports that he does  not currently use drugs.  Family History:  History reviewed. No pertinent family history.  Blood pressure (!) 155/89, pulse 74, temperature 98.9 F (37.2 C), temperature source Oral, resp. rate 13, height 5\' 10"  (1.778  m), weight 102 kg, SpO2 99%. General: Alert, cooperative NAD. Eyes: Scleral icterus bilaterally CV: RRR without murmur RESP: CTAB.  Normal work of breathing on room air. GI: Soft, mild abdominal distention and fullness.  Nontender.  + BS. EXT: Trace peripheral edema   Jonne Netters, DO [facility], PGY-2 03/23/2024, 8:27 AM

## 2024-03-23 NOTE — Discharge Summary (Signed)
 Physician Discharge Summary  Sean Foster NWG:[id number] DOB: 01/05/1976 DOA: 03/20/2024  PCP: Sean Chessman, PA-C  Admit date: 03/20/2024 Discharge date: 03/23/2024  Admitted From: Home Disposition: Home  Recommendations for Outpatient Follow-up:  Follow up with PCP in 1-2 weeks Please obtain CMP/CBC in one week Coordinate with your primary care provider to continue follow-up with hepatology clinic.  Home Health: N/A Equipment/Devices: N/A  Discharge Condition: Stable CODE STATUS: Full code Diet recommendation: Low-salt and low-carb diet  Discharge summary: 48 y.o. male with medical history significant of right-sided retinal detachment followed by ophthalmology, essential hypertension, hyperlipidemia, type 2 diabetes with recently known A1c of 6.6 on glipizide, chronic combined heart failure with known ejection fraction 40 to 45%, moderate to severe mitral regurgitation, cirrhosis of liver thought to be chronic hepatitis B with recent multiple investigations, recent admission to the hospital with nausea vomiting and right upper quadrant abdominal pain and discharged on 4/22 with suggested outpatient follow-up presented back to the emergency room with ongoing abdominal discomfort, right upper quadrant discomfort ongoing and worsening for last few days, progressively worsening abdominal distention that he thinks he is retaining fluid, poor appetite.    Patient neurologically stable.  Alert awake and oriented.  Vitals are stable. Renal function test, slight elevated creatinine of 1.38. LFTs with AST/ALT 427/58 at about his chronic levels Bilirubin 20, it was 15 about a week ago. No evidence of tremors or asterixis. Case discussed with neurology by ER physician.  Spot EEG was without any abnormalities.  Admitted for management of cirrhosis and ascites and abdominal pain.  # Subacute liver failure, decompensated with ascites causing abdominal pain and discomfort: Extensively  investigated in the past.  Supposed to follow-up with hepatology at [facility].  Currently does not have any evidence of fulminant hepatic failure. Status post paracentesis, 600 mL transudate removed.  No evidence of infection.. Ammonia levels are normal.  PT/INR 1.7. Advised patient to call his hepatologist to schedule an appointment. Patient is euvolemic now.  He will just resume hydrochlorothiazide . Abdominal pain has improved.  Have not required any pain medications last 24 hours. Suggested to continue lactulose  to make 2-3 bowel movements a day. Bilirubin 20 on presentation-14.5 today.   Suspected seizure: Ruled out.  Seen by neurology.  Atypical presentation.  EEG without evidence of seizure activities.  Seizure precautions.  Less likely seizure episodes.    Type 2 diabetes: Poorly controlled.  On metformin  at home.  Prescription given.   Essential hypertension blood pressure is stable.  Doxazosin , hydralazine  and hydrochlorothiazide  to resume.  Prescriptions provided.   Acute kidney injury: Likely multifactorial.  Volume depletion.  Already improving.  Recheck in 1 week to ensure is stabilizing.   Patient is medically stabilized.  Multiple times reiterated to follow-up with hepatology clinic.   Discharge Diagnoses:  Principal Problem:   Subacute liver failure Active Problems:   Diabetes (HCC)   Seizure (HCC)   Retinal detachment   Essential hypertension   Chronic hepatitis B (HCC)    Discharge Instructions  Discharge Instructions     Diet - low sodium heart healthy   Complete by: As directed    Discharge instructions   Complete by: As directed    Call and schedule follow-up with your liver doctor, get permission from your primary care doctor.   Increase activity slowly   Complete by: As directed       Allergies as of 03/23/2024       Reactions   Chloroquine Itching   Chlorothen  Hives, Itching, Other (See Comments)   "Chlorothen (trade name Forensic scientist) is  an antihistamine and anticholinergic"        Medication List     TAKE these medications    atorvastatin 40 MG tablet Commonly known as: LIPITOR Take 1 tablet (40 mg total) by mouth daily.   doxazosin  2 MG tablet Commonly known as: CARDURA  Take 1 tablet (2 mg total) by mouth at bedtime.   hydrALAZINE  25 MG tablet Commonly known as: APRESOLINE  Take 1 tablet (25 mg total) by mouth 3 (three) times daily.   hydrochlorothiazide  25 MG tablet Commonly known as: HYDRODIURIL  Take 1 tablet (25 mg total) by mouth daily.   lactulose  10 GM/15ML solution Commonly known as: CHRONULAC  Take 15 mLs (10 g total) by mouth daily as needed for mild constipation.   metFORMIN  500 MG tablet Commonly known as: GLUCOPHAGE  Take 1 tablet (500 mg total) by mouth 2 (two) times daily with a meal.   prednisoLONE acetate 1 % ophthalmic suspension Commonly known as: PRED FORTE Place 1 drop into the right eye 2 (two) times daily.        Allergies  Allergen Reactions   Chloroquine Itching   Chlorothen Hives, Itching and Other (See Comments)    "Chlorothen (trade name Forensic scientist) is an antihistamine and anticholinergic"    Consultations: Nephrology   Procedures/Studies: US  Paracentesis Result Date: 03/21/2024 INDICATION: [id number] Ascites [id number] 48 year old male with new diagnosis of hepatic cirrhosis, with ascites and jaundice. IR was requested for diagnostic and therapeutic paracentesis. EXAM: ULTRASOUND GUIDED DIAGNOSTIC AND THERAPEUTIC PARACENTESIS MEDICATIONS: 7 cc of 1% lidocaine  COMPLICATIONS: None immediate. PROCEDURE: Informed written consent was obtained from the patient after a discussion of the risks, benefits and alternatives to treatment. A timeout was performed prior to the initiation of the procedure. Initial ultrasound scanning demonstrates a small amount of ascites within the right lower abdominal quadrant. The right lower abdomen was prepped and draped in the usual sterile fashion. 1%  lidocaine  was used for local anesthesia. Following this, a 6 Fr Safe-T-Centesis catheter was introduced. An ultrasound image was saved for documentation purposes. The paracentesis was performed. The catheter was removed and a dressing was applied. The patient tolerated the procedure well without immediate post procedural complication. FINDINGS: A total of approximately 600 mL of clear, golden yellow peritoneal fluid was removed. Samples were sent to the laboratory as requested by the clinical team. IMPRESSION: Successful ultrasound-guided diagnostic and therapeutic paracentesis yielding 600 mL of peritoneal fluid. Procedure performed by Lambert Pillion, PA-C PLAN: If the patient eventually requires >/=2 paracenteses in a 30 day period, candidacy for formal evaluation by the [facility]pert[facility] will be assessed. Art Largo, MD [facility] Radiology Electronically Signed   By: Art Largo M.D.   On: 03/21/2024 11:42   EEG adult Result Date: 03/20/2024 Augustin Leber, MD     03/20/2024  5:47 PM History: 48 yo M with shaking concecrning for seizure Sedation: none Patient State: Awake and drowsy Technique: This EEG was acquired with electrodes placed according to the International 10-20 electrode system (including Fp1, Fp2, F3, F4, C3, C4, P3, P4, O1, O2, T3, T4, T5, T6, A1, A2, Fz, Cz, Pz). The following electrodes were missing or displaced: none. Background: The background consists of intermixed alpha and beta activities. There is a well defined posterior dominant rhythm of 9 Hz that attenuates with eye opening. There is mild anterior shifting of PDR with drowsiness. Photic stimulation: Physiologic driving is  present EEG Abnormalities: none Clinical Interpretation: This normal EEG is recorded in the waking and drowsy state. There was no seizure or seizure predisposition recorded on this study. Please note that lack of  epileptiform activity on EEG does not preclude the possibility of epilepsy. Ann Keto, MD [facility] If 7pm- 7am, please page neurology on call as listed in AMION.  DG Chest 2 View Result Date: 03/20/2024 CLINICAL DATA:  Shortness of breath.  Seizure. EXAM: CHEST - 2 VIEW COMPARISON:  Chest radiograph July 18, 2022 FINDINGS: Stable cardiomegaly. Mild elevation right hemidiaphragm. No large area pulmonary consolidation. No pleural effusion or pneumothorax. Osseous structures unremarkable. IMPRESSION: Cardiomegaly. No acute cardiopulmonary process. Electronically Signed   By: Jone Neither M.D.   On: 03/20/2024 17:29   MR Brain W and Wo Contrast Result Date: 03/20/2024 CLINICAL DATA:  Seizure. EXAM: MRI HEAD WITHOUT AND WITH CONTRAST TECHNIQUE: Multiplanar, multiecho pulse sequences of the brain and surrounding structures were obtained without and with intravenous contrast. CONTRAST:  10mL GADAVIST  GADOBUTROL  1 MMOL/ML IV SOLN COMPARISON:  MR head without contrast 02/17/2024 FINDINGS: Brain: No acute infarct, hemorrhage, or mass lesion is present. Periventricular and subcortical T2 hyperintensities bilaterally are moderately advanced for age. The ventricles are of normal size. Deep brain nuclei are within normal limits. The brainstem and cerebellum are within normal limits. The internal auditory canals are within normal limits. Midline structures are within normal limits. Postcontrast images demonstrate no pathologic enhancement. Vascular: Flow is present in the major intracranial arteries. Skull and upper cervical spine: The craniocervical junction is normal. Upper cervical spine is within normal limits. Marrow signal is unremarkable. Sinuses/Orbits: Polyps or mucous retention cysts are again noted in the right maxillary sinus. The paranasal sinuses and mastoid air cells are otherwise clear. The globes and orbits are within normal limits. IMPRESSION: 1. No acute intracranial abnormality  or significant interval change. 2. Periventricular and subcortical T2 hyperintensities bilaterally are moderately advanced for age. The finding is nonspecific but can be seen in the setting of chronic microvascular ischemia, a demyelinating process such as multiple sclerosis, vasculitis, complicated migraine headaches, or as the sequelae of a prior infectious or inflammatory process. Electronically Signed   By: Audree Leas M.D.   On: 03/20/2024 17:18   CT ABDOMEN PELVIS W CONTRAST Result Date: 03/14/2024 CLINICAL DATA:  Hepatitis, RLQ pain. EXAM: CT ABDOMEN AND PELVIS WITH CONTRAST TECHNIQUE: Multidetector CT imaging of the abdomen and pelvis was performed using the standard protocol following bolus administration of intravenous contrast. RADIATION DOSE REDUCTION: This exam was performed according to the departmental dose-optimization program which includes automated exposure control, adjustment of the mA and/or kV according to patient size and/or use of iterative reconstruction technique. CONTRAST:  OMNIPAQUE  IOHEXOL  300 MG/ML  SOLN COMPARISON:  02/16/2024. FINDINGS: Lower chest: Lung bases clear.  No pericardial or pleural effusions. Hepatobiliary: Nodular. The liver consistent with cirrhosis. No focal hepatic parenchymal lesions or biliary ductal dilatation. Gallbladder is distended. Pancreas: No pancreatic parenchymal abnormalities identified. There is fluid in the mesentery and retroperitoneum which is consistent with anasarca. Acute pancreatitis would not be excludable on this examination and correlation with labs is recommended. Spleen: Normal in size without focal abnormality. Adrenals/Urinary Tract: Adrenal glands are unremarkable. 2 cm cyst right kidney. No hydronephrosis or nephrolithiasis. Unremarkable urinary bladder. The cyst does not need to be followed up. Stomach/Bowel: Stomach is within normal limits. Appendix appears normal. No bowel dilatation to suggest obstruction. There is  diffuse colonic wall thickening consistent with colitis. No  fluid collections are seen to suggest abscesses. Vascular/Lymphatic: Aortic atherosclerosis. No enlarged abdominal or pelvic lymph nodes. Reproductive: Prostate is unremarkable. Other: Diffuse subcutaneous edema, skin thickening, mesenteric edema and ascites consistent with anasarca. Musculoskeletal: No acute or significant osseous findings. IMPRESSION: 1. Diffuse colonic wall thickening consistent with colitis. 2. Cirrhosis. 3. Anasarca. 4. Right renal cyst. 5. Aortic atherosclerosis (ICD10-I70.0). Electronically Signed   By: Sydell Eva M.D.   On: 03/14/2024 18:38   (Echo, Carotid, EGD, Colonoscopy, ERCP)    Subjective: Seen in the morning rounds.  Denies any complaints.  No more diarrhea overnight.  Discussed about importance of following up and compliance to medications as well as follow-up.  He is agreeable to call his primary care physician or hepatology clinic to schedule follow-up.   Discharge Exam: Vitals:   03/23/24 0430 03/23/24 0806  BP: (!) 177/91 (!) 155/89  Pulse: 61 74  Resp: 18 13  Temp: 98.8 F (37.1 C) 98.9 F (37.2 C)  SpO2: 100% 99%   Vitals:   03/23/24 0200 03/23/24 0400 03/23/24 0430 03/23/24 0806  BP:   (!) 177/91 (!) 155/89  Pulse:   61 74  Resp: 18 15 18 13   Temp:   98.8 F (37.1 C) 98.9 F (37.2 C)  TempSrc:   Oral Oral  SpO2:   100% 99%  Weight:      Height:        General: Pt is alert, awake, not in acute distress Icteric. Patient has poor vision on both sides, right more than left. Cardiovascular: RRR, S1/S2 +, no rubs, no gallops Respiratory: CTA bilaterally, no wheezing, no rhonchi Abdominal: Soft, NT, ND, bowel sounds + Extremities: no edema, no cyanosis    The results of significant diagnostics from this hospitalization (including imaging, microbiology, ancillary and laboratory) are listed below for reference.     Microbiology: Recent Results (from the past 240 hours)   Culture, body fluid w Gram Stain-bottle     Status: None (Preliminary result)   Collection Time: 03/21/24 10:23 AM   Specimen: Peritoneal Washings  Result Value Ref Range Status   Specimen Description PERITONEAL  Final   Special Requests NONE  Final   Culture   Final    NO GROWTH 2 DAYS Performed at [facility], [address] [place], [place] [address]    Report Status PENDING  Incomplete  Gram stain     Status: None   Collection Time: 03/21/24 10:23 AM   Specimen: Peritoneal Washings  Result Value Ref Range Status   Specimen Description PERITONEAL  Final   Special Requests NONE  Final   Gram Stain   Final    NO WBC SEEN NO ORGANISMS SEEN Performed at [facility], [address], [place], [place] [address]    Report Status 03/21/2024 FINAL  Final     Labs: BNP (last 3 results) Recent Labs    03/20/24 1317  BNP 138.1*   Basic Metabolic Panel: Recent Labs  Lab 03/20/24 1317 03/21/24 0744 03/22/24 0615 03/23/24 0528  NA 133* 131* 134* 135  K 3.4* 3.7 3.5 3.5  CL 107 109 109 108  CO2 18* 16* 18* 20*  GLUCOSE 142* 258* 193* 99  BUN 22* 30* 37* 40*  CREATININE 1.38* 1.56* 1.91* 1.67*  CALCIUM 8.3* 8.1* 8.5* 8.2*  MG 2.0 1.9  --   --   PHOS  --  3.4  --   --    Liver Function Tests: Recent Labs  Lab 03/20/24 1317 03/21/24 0744 03/22/24 0615 03/23/24 0528  AST 427* 247* 113* 104*  ALT 58* 37 26 24  ALKPHOS 149* 133* 118 129*  BILITOT 20.1* 18.0* 15.4* 14.5*  PROT 7.1 7.0 6.4* 6.2*  ALBUMIN  1.9* 1.6* 1.9* 1.9*   No results for input(s): "LIPASE", "AMYLASE" in the last 168 hours. Recent Labs  Lab 03/20/24 1317 03/21/24 0744  AMMONIA 42* 86*   CBC: Recent Labs  Lab 03/20/24 1317 03/21/24 0744  WBC 5.4 10.3  NEUTROABS 2.5  --   HGB 10.4* 10.1*  HCT 29.9* 28.3*  MCV 66.6* 65.5*  PLT 159 156   Cardiac Enzymes: No results for input(s): "CKTOTAL", "CKMB", "CKMBINDEX", "TROPONINI" in the last 168 hours. BNP: Invalid input(s):  "POCBNP" CBG: Recent Labs  Lab 03/22/24 0614 03/22/24 1219 03/22/24 1616 03/22/24 2104 03/23/24 0607  GLUCAP 192* 209* 174* 315* 99   D-Dimer No results for input(s): "DDIMER" in the last 72 hours. Hgb A1c No results for input(s): "HGBA1C" in the last 72 hours. Lipid Profile No results for input(s): "CHOL", "HDL", "LDLCALC", "TRIG", "CHOLHDL", "LDLDIRECT" in the last 72 hours. Thyroid function studies No results for input(s): "TSH", "T4TOTAL", "T3FREE", "THYROIDAB" in the last 72 hours.  Invalid input(s): "FREET3" Anemia work up No results for input(s): "VITAMINB12", "FOLATE", "FERRITIN", "TIBC", "IRON", "RETICCTPCT" in the last 72 hours. Urinalysis    Component Value Date/Time   COLORURINE AMBER (A) 03/20/2024 1730   APPEARANCEUR CLEAR 03/20/2024 1730   LABSPEC 1.029 03/20/2024 1730   PHURINE 5.0 03/20/2024 1730   GLUCOSEU NEGATIVE 03/20/2024 1730   HGBUR SMALL (A) 03/20/2024 1730   BILIRUBINUR MODERATE (A) 03/20/2024 1730   KETONESUR NEGATIVE 03/20/2024 1730   PROTEINUR >=300 (A) 03/20/2024 1730   NITRITE NEGATIVE 03/20/2024 1730   LEUKOCYTESUR NEGATIVE 03/20/2024 1730   Sepsis Labs Recent Labs  Lab 03/20/24 1317 03/21/24 0744  WBC 5.4 10.3   Microbiology Recent Results (from the past 240 hours)  Culture, body fluid w Gram Stain-bottle     Status: None (Preliminary result)   Collection Time: 03/21/24 10:23 AM   Specimen: Peritoneal Washings  Result Value Ref Range Status   Specimen Description PERITONEAL  Final   Special Requests NONE  Final   Culture   Final    NO GROWTH 2 DAYS Performed at [facility], [address] [place], [place] [address]    Report Status PENDING  Incomplete  Gram stain     Status: None   Collection Time: 03/21/24 10:23 AM   Specimen: Peritoneal Washings  Result Value Ref Range Status   Specimen Description PERITONEAL  Final   Special Requests NONE  Final   Gram Stain   Final    NO WBC SEEN NO ORGANISMS  SEEN Performed at [facility], [address] [place], [place] [address]    Report Status 03/21/2024 FINAL  Final     Time coordinating discharge: 32 minutes  SIGNED:   Vada Garibaldi, MD  [facility] 03/23/2024, 11:58 AM

## 2024-03-23 NOTE — TOC Transition Note (Signed)
 Transition of Care [facility]) - Discharge Note   Patient Details  Name: Brian Giustino MRN: [id number] Date of Birth: 09-15-1976  Transition of Care [facility]) CM/SW Contact:  Jonathan Neighbor, RN Phone Number: 03/23/2024, 12:04 PM   Clinical Narrative:     Pt is discharging home with son. No further TOC needs. Referral sent in [facility] for his SDOH needs.   Final next level of care: Home/Self Care Barriers to Discharge: No Barriers Identified   Patient Goals and CMS Choice            Discharge Placement                       Discharge Plan and Services Additional resources added to the After Visit Summary for     Discharge Planning Services: CM Consult                                 Social Drivers of Health (SDOH) Interventions SDOH Screenings   Food Insecurity: Food Insecurity Present (03/21/2024)  Housing: High Risk (03/21/2024)  Transportation Needs: Unmet Transportation Needs (03/21/2024)  Utilities: At Risk (03/21/2024)  Tobacco Use: Low Risk  (03/20/2024)  Recent Concern: Tobacco Use - High Risk (03/11/2024)   Received from [facility]     Readmission Risk Interventions    03/16/2024   10:24 AM  Readmission Risk Prevention Plan  Transportation Screening Complete  PCP or Specialist Appt within 5-7 Days Complete  Home Care Screening Complete  Medication Review (RN CM) Complete

## 2024-03-23 NOTE — Progress Notes (Signed)
 DC instructions, med list, and written info give nand reviewed with pt and son, Tobi.  Both verbalized understanding.  Pt has a F/U appt today with his PCP at 1600 and while this RN was reviewing the paperwork, pt rcd a call from [facility] and an appt has been made for May 6th.This RN reiterated the importance of keeping these appts to get the appropriate follow up care.    Tele and IV removed.  Volunteer services here with a wheelchair for discharge.  They will be stopping at the [facility] pharmacy to pick up new RX's.    Pt left unit via WC without incident

## 2024-03-24 LAB — CYTOLOGY - NON PAP

## 2024-03-25 LAB — LIPASE, FLUID: Lipase-Fluid: 4 U/L

## 2024-03-26 LAB — CULTURE, BODY FLUID W GRAM STAIN -BOTTLE: Culture: NO GROWTH

## 2024-04-16 ENCOUNTER — Other Ambulatory Visit: Payer: Self-pay

## 2024-04-16 ENCOUNTER — Inpatient Hospital Stay (HOSPITAL_COMMUNITY)
Admission: EM | Admit: 2024-04-16 | Discharge: 2024-04-19 | DRG: 432 | Disposition: A | Discharge status: Other Acute Inpatient Hospital | Attending: Family Medicine | Admitting: Family Medicine

## 2024-04-16 ENCOUNTER — Emergency Department (HOSPITAL_COMMUNITY)

## 2024-04-16 ENCOUNTER — Encounter (HOSPITAL_COMMUNITY): Payer: Self-pay

## 2024-04-16 DIAGNOSIS — Z7982 Long term (current) use of aspirin: Secondary | ICD-10-CM

## 2024-04-16 DIAGNOSIS — Z79899 Other long term (current) drug therapy: Secondary | ICD-10-CM

## 2024-04-16 DIAGNOSIS — R188 Other ascites: Secondary | ICD-10-CM | POA: Diagnosis present

## 2024-04-16 DIAGNOSIS — K7682 Hepatic encephalopathy: Secondary | ICD-10-CM | POA: Diagnosis not present

## 2024-04-16 DIAGNOSIS — Z5982 Transportation insecurity: Secondary | ICD-10-CM

## 2024-04-16 DIAGNOSIS — D6959 Other secondary thrombocytopenia: Secondary | ICD-10-CM | POA: Diagnosis present

## 2024-04-16 DIAGNOSIS — I340 Nonrheumatic mitral (valve) insufficiency: Secondary | ICD-10-CM | POA: Diagnosis present

## 2024-04-16 DIAGNOSIS — R5383 Other fatigue: Secondary | ICD-10-CM | POA: Diagnosis present

## 2024-04-16 DIAGNOSIS — R5381 Other malaise: Secondary | ICD-10-CM | POA: Diagnosis present

## 2024-04-16 DIAGNOSIS — Z7984 Long term (current) use of oral hypoglycemic drugs: Secondary | ICD-10-CM

## 2024-04-16 DIAGNOSIS — F17210 Nicotine dependence, cigarettes, uncomplicated: Secondary | ICD-10-CM | POA: Diagnosis present

## 2024-04-16 DIAGNOSIS — K7460 Unspecified cirrhosis of liver: Secondary | ICD-10-CM | POA: Diagnosis present

## 2024-04-16 DIAGNOSIS — D509 Iron deficiency anemia, unspecified: Secondary | ICD-10-CM | POA: Diagnosis present

## 2024-04-16 DIAGNOSIS — R64 Cachexia: Secondary | ICD-10-CM | POA: Diagnosis present

## 2024-04-16 DIAGNOSIS — B181 Chronic viral hepatitis B without delta-agent: Secondary | ICD-10-CM | POA: Diagnosis present

## 2024-04-16 DIAGNOSIS — E1122 Type 2 diabetes mellitus with diabetic chronic kidney disease: Secondary | ICD-10-CM | POA: Diagnosis present

## 2024-04-16 DIAGNOSIS — E785 Hyperlipidemia, unspecified: Secondary | ICD-10-CM | POA: Diagnosis present

## 2024-04-16 DIAGNOSIS — I130 Hypertensive heart and chronic kidney disease with heart failure and stage 1 through stage 4 chronic kidney disease, or unspecified chronic kidney disease: Secondary | ICD-10-CM | POA: Diagnosis present

## 2024-04-16 DIAGNOSIS — N1831 Chronic kidney disease, stage 3a: Secondary | ICD-10-CM | POA: Diagnosis present

## 2024-04-16 DIAGNOSIS — Z888 Allergy status to other drugs, medicaments and biological substances status: Secondary | ICD-10-CM

## 2024-04-16 DIAGNOSIS — N179 Acute kidney failure, unspecified: Secondary | ICD-10-CM

## 2024-04-16 DIAGNOSIS — I5022 Chronic systolic (congestive) heart failure: Secondary | ICD-10-CM | POA: Diagnosis present

## 2024-04-16 DIAGNOSIS — R17 Unspecified jaundice: Secondary | ICD-10-CM

## 2024-04-16 DIAGNOSIS — Z6832 Body mass index (BMI) 32.0-32.9, adult: Secondary | ICD-10-CM

## 2024-04-16 LAB — CBC WITH DIFFERENTIAL/PLATELET
Abs Immature Granulocytes: 0 10*3/uL (ref 0.00–0.07)
Basophils Absolute: 0 10*3/uL (ref 0.0–0.1)
Basophils Relative: 0 %
Eosinophils Absolute: 0.2 10*3/uL (ref 0.0–0.5)
Eosinophils Relative: 2 %
HCT: 25.4 % — ABNORMAL LOW (ref 39.0–52.0)
Hemoglobin: 8.5 g/dL — ABNORMAL LOW (ref 13.0–17.0)
Lymphocytes Relative: 29 %
Lymphs Abs: 2.5 10*3/uL (ref 0.7–4.0)
MCH: 23.1 pg — ABNORMAL LOW (ref 26.0–34.0)
MCHC: 33.5 g/dL (ref 30.0–36.0)
MCV: 69 fL — ABNORMAL LOW (ref 80.0–100.0)
Monocytes Absolute: 0.4 10*3/uL (ref 0.1–1.0)
Monocytes Relative: 5 %
Neutro Abs: 5.4 10*3/uL (ref 1.7–7.7)
Neutrophils Relative %: 64 %
Platelets: 143 10*3/uL — ABNORMAL LOW (ref 150–400)
RBC: 3.68 MIL/uL — ABNORMAL LOW (ref 4.22–5.81)
RDW: 25.8 % — ABNORMAL HIGH (ref 11.5–15.5)
WBC: 8.5 10*3/uL (ref 4.0–10.5)
nRBC: 0 % (ref 0.0–0.2)
nRBC: 1 /100{WBCs} — ABNORMAL HIGH

## 2024-04-16 LAB — COMPREHENSIVE METABOLIC PANEL WITH GFR
ALT: 15 U/L (ref 0–44)
AST: 206 U/L — ABNORMAL HIGH (ref 15–41)
Albumin: 2.6 g/dL — ABNORMAL LOW (ref 3.5–5.0)
Alkaline Phosphatase: 145 U/L — ABNORMAL HIGH (ref 38–126)
Anion gap: 11 (ref 5–15)
BUN: 29 mg/dL — ABNORMAL HIGH (ref 6–20)
CO2: 16 mmol/L — ABNORMAL LOW (ref 22–32)
Calcium: 8.1 mg/dL — ABNORMAL LOW (ref 8.9–10.3)
Chloride: 107 mmol/L (ref 98–111)
Creatinine, Ser: 2.37 mg/dL — ABNORMAL HIGH (ref 0.61–1.24)
GFR, Estimated: 33 mL/min — ABNORMAL LOW (ref >60)
Glucose, Bld: 148 mg/dL — ABNORMAL HIGH (ref 70–99)
Potassium: 4.2 mmol/L (ref 3.5–5.1)
Sodium: 134 mmol/L — ABNORMAL LOW (ref 135–145)
Total Bilirubin: 18.8 mg/dL (ref 0.0–1.2)
Total Protein: 6.2 g/dL — ABNORMAL LOW (ref 6.5–8.1)

## 2024-04-16 LAB — PROTIME-INR
INR: 1.6 — ABNORMAL HIGH (ref 0.8–1.2)
Prothrombin Time: 19.1 s — ABNORMAL HIGH (ref 11.4–15.2)

## 2024-04-16 LAB — APTT: aPTT: 47 s — ABNORMAL HIGH (ref 24–36)

## 2024-04-16 LAB — AMMONIA: Ammonia: 89 umol/L — ABNORMAL HIGH (ref 9–35)

## 2024-04-16 LAB — ETHANOL: Alcohol, Ethyl (B): <15 mg/dL (ref <15)

## 2024-04-16 MED ORDER — SODIUM CHLORIDE 0.9 % IV BOLUS
500.0000 mL | Freq: Once | INTRAVENOUS | Status: AC
  Administered 2024-04-16: 500 mL via INTRAVENOUS

## 2024-04-16 MED ORDER — LACTULOSE 10 GM/15ML PO SOLN
30.0000 g | Freq: Once | ORAL | Status: AC
  Administered 2024-04-16: 30 g via ORAL
  Filled 2024-04-16: qty 60

## 2024-04-16 MED ORDER — LORAZEPAM 2 MG/ML IJ SOLN
INTRAMUSCULAR | Status: AC
  Filled 2024-04-16: qty 1

## 2024-04-16 NOTE — ED Provider Notes (Signed)
 [place] EMERGENCY DEPARTMENT AT [facility] Provider Note   CSN: [id number] Arrival date & time: 04/16/24  2211     History  Chief Complaint  Patient presents with   Altered Mental Status    Sean Foster is a 48 y.o. male.  The history is provided by the patient and medical records.   48 y.o. M with history of hepatitis B, diabetes, hypertension, subacute liver failure currently being evaluated for possible transplant at [facility], presenting to the ED for AMS.  Reportedly collapsed at home and became unresponsive, witnessed by family.  Family reported several episodes of "shaking", history of same but no formal diagnosis of seizure disorder.  Occurred a few times with EMS but never went unresponsive.  Patient is alert on arrival, does admit he has felt "out of it" over the past day or so.  Reportedly had paracentesis yesterday but states abdomen feels "full" again.  No reported vomiting.  No diarrhea.  No fevers.  Home Medications Prior to Admission medications   Medication Sig Start Date End Date Taking? Authorizing Provider  atorvastatin  (LIPITOR) 40 MG tablet Take 1 tablet (40 mg total) by mouth daily. 03/23/24 06/21/24  Vada Garibaldi, MD  doxazosin  (CARDURA ) 2 MG tablet Take 1 tablet (2 mg total) by mouth at bedtime. 03/23/24   Vada Garibaldi, MD  hydrALAZINE  (APRESOLINE ) 25 MG tablet Take 1 tablet (25 mg total) by mouth 3 (three) times daily. 03/23/24 06/21/24  Vada Garibaldi, MD  hydrochlorothiazide  (HYDRODIURIL ) 25 MG tablet Take 1 tablet (25 mg total) by mouth daily. 03/23/24 06/21/24  Vada Garibaldi, MD  lactulose  (CHRONULAC ) 10 GM/15ML solution Take 15 mLs (10 g total) by mouth daily as needed for mild constipation. 03/16/24   Magdalene School, MD  metFORMIN  (GLUCOPHAGE ) 500 MG tablet Take 1 tablet (500 mg total) by mouth 2 (two) times daily with a meal. 03/23/24   Vada Garibaldi, MD  prednisoLONE acetate (PRED FORTE) 1 % ophthalmic suspension Place 1 drop into the right  eye 2 (two) times daily. 03/16/24   [provider]  amLODipine  (NORVASC ) 5 MG tablet Take 1 tablet (5 mg total) by mouth daily. Patient not taking: Reported on 06/16/2019 02/24/18 06/16/19  Bart Born, MD      Allergies    Chloroquine and Chlorothen    Review of Systems   Review of Systems  Unable to perform ROS: Mental status change    Physical Exam Updated Vital Signs BP (!) 160/83   Pulse 64   Temp 98.4 F (36.9 C) (Oral)   Resp 14   Ht 5\' 10"  (1.778 m)   Wt 102 kg   SpO2 100%   BMI 32.27 kg/m   Physical Exam Vitals and nursing note reviewed.  Constitutional:      Appearance: He is well-developed.  HENT:     Head: Normocephalic and atraumatic.  Eyes:     Conjunctiva/sclera: Conjunctivae normal.     Pupils: Pupils are equal, round, and reactive to light.     Comments: Scleral icterus  Cardiovascular:     Rate and Rhythm: Normal rate and regular rhythm.     Heart sounds: Normal heart sounds.  Pulmonary:     Effort: Pulmonary effort is normal.     Breath sounds: Normal breath sounds.  Abdominal:     General: Bowel sounds are normal.     Palpations: Abdomen is soft.     Comments: Protuberant abdomen, seems to have some ascites, no peritoneal signs on exam  Musculoskeletal:  General: Normal range of motion.     Cervical back: Normal range of motion.  Skin:    General: Skin is warm and dry.  Neurological:     Mental Status: He is alert and oriented to person, place, and time.     ED Results / Procedures / Treatments   Labs (all labs ordered are listed, but only abnormal results are displayed) Labs Reviewed  CBC WITH DIFFERENTIAL/PLATELET - Abnormal; Notable for the following components:      Result Value   RBC 3.68 (*)    Hemoglobin 8.5 (*)    HCT 25.4 (*)    MCV 69.0 (*)    MCH 23.1 (*)    RDW 25.8 (*)    Platelets 143 (*)    nRBC 1 (*)    All other components within normal limits  COMPREHENSIVE METABOLIC PANEL WITH GFR - Abnormal;  Notable for the following components:   Sodium 134 (*)    CO2 16 (*)    Glucose, Bld 148 (*)    BUN 29 (*)    Creatinine, Ser 2.37 (*)    Calcium  8.1 (*)    Total Protein 6.2 (*)    Albumin  2.6 (*)    AST 206 (*)    Alkaline Phosphatase 145 (*)    Total Bilirubin 18.8 (*)    GFR, Estimated 33 (*)    All other components within normal limits  PROTIME-INR - Abnormal; Notable for the following components:   Prothrombin Time 19.1 (*)    INR 1.6 (*)    All other components within normal limits  APTT - Abnormal; Notable for the following components:   aPTT 47 (*)    All other components within normal limits  AMMONIA - Abnormal; Notable for the following components:   Ammonia 89 (*)    All other components within normal limits  ETHANOL  RAPID URINE DRUG SCREEN, HOSP PERFORMED  URINALYSIS, W/ REFLEX TO CULTURE (INFECTION SUSPECTED)    EKG None  Radiology CT HEAD WO CONTRAST ( ) Result Date: 04/16/2024 CLINICAL DATA:  Altered mental status. EXAM: CT HEAD WITHOUT CONTRAST TECHNIQUE: Contiguous axial images were obtained from the base of the skull through the vertex without intravenous contrast. RADIATION DOSE REDUCTION: This exam was performed according to the departmental dose-optimization program which includes automated exposure control, adjustment of the mA and/or kV according to patient size and/or use of iterative reconstruction technique. COMPARISON:  February 16, 2024 FINDINGS: Brain: No evidence of acute infarction, hemorrhage, hydrocephalus, extra-axial collection or mass lesion/mass effect. Vascular: No hyperdense vessel or unexpected calcification. Skull: A posterior parietal craniotomy defect is seen along the vertex. Sinuses/Orbits: No acute finding. Other: A 1.7 cm x 2.8 cm well-defined area of low attenuation (approximately 10.93 Hounsfield units is seen within the scalp soft tissues of the posterior fossa, slightly to the right of midline. An ill-defined, 1.2 cm x 1.4 cm area  of low attenuation (approximately 47.65 Hounsfield units) is seen within this region on the prior study. IMPRESSION: 1. No acute intracranial abnormality. 2. Findings which may represent a small scalp soft tissue abscess versus seroma. Correlation with physical examination is recommended. Electronically Signed   By: Virgle Grime M.D.   On: 04/16/2024 23:53   DG Chest Port 1 View Result Date: 04/16/2024 CLINICAL DATA:  AMS EXAM: PORTABLE CHEST 1 VIEW COMPARISON:  None Available. FINDINGS: Prominent cardiac silhouette likely exaggerated by AP portable technique and low lung volumes. The heart and mediastinal contours are grossly unchanged. Low  lung volumes. No focal consolidation. No pulmonary edema. No pleural effusion. No pneumothorax. No acute osseous abnormality. IMPRESSION: Low lung volumes with no active disease. Electronically Signed   By: Morgane  Naveau M.D.   On: 04/16/2024 22:45    Procedures Procedures    CRITICAL CARE Performed by: Coretha Dew   Total critical care time: 60 minutes  Critical care time was exclusive of separately billable procedures and treating other patients.  Critical care was necessary to treat or prevent imminent or life-threatening deterioration.  Critical care was time spent personally by me on the following activities: development of treatment plan with patient and/or surrogate as well as nursing, discussions with consultants, evaluation of patient's response to treatment, examination of patient, obtaining history from patient or surrogate, ordering and performing treatments and interventions, ordering and review of laboratory studies, ordering and review of radiographic studies, pulse oximetry and re-evaluation of patient's condition.   Medications Ordered in ED Medications  lactulose  (CHRONULAC ) 10 GM/15ML solution 30 g (30 g Oral Given 04/16/24 2356)  sodium chloride  0.9 % bolus 500 mL (500 mLs Intravenous New Bag/Given 04/16/24 2356)    ED  Course/ Medical Decision Making/ A&P                                 Medical Decision Making Amount and/or Complexity of Data Reviewed Labs: ordered. Radiology: ordered and independent interpretation performed. ECG/medicine tests: ordered and independent interpretation performed.  Risk Prescription drug management. Decision regarding hospitalization.   48 y.o. 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  Apparently had syncopal episode at home, unclear if any loss of consciousness.  Has had some shaking episodes with EMS but no full loss of consciousness, incontinence, or postictal period.  He is awake and alert on arrival.  Does admit he has been feeling "off".  Has protuberant abdomen, seems to have a little bit of ascites.  Lungs are clear without any wheezes or rhonchi.  Vitals are stable on room air.  I suspect he has some hepatic encephalopathy causing his change in mental status.  Workup pursued.  Labs as above--no leukocytosis.  Has anemia but this appears stable when compared with prior.  Does have worsening renal function consistent with AKI today, creatinine 2.37, baseline is around 1.3.  Bili back up to 18.8.  Ammonia 89.  CXR is clear.   Appears to indeed have hepatic encephalopathy and decompensated cirrhosis.  MELD score up to 32 which is higher than previous.  11:30 PM Family now at bedside.  Has not been taking his lactulose .  He has been eating fruits and drinking limited water , did have some ice cream today.  Vomited prior to EMS arrival.  He was referred [facility] for possible transplant evaluation, has not yet had any preliminary testing.  This is scheduled for later in June.  CT head negative.  Findings of possible seroma occipital scalp.  Similar to prior.  Given small bolus and dose of lactulose  here.  Will require admission.  1:18 AM Spoke to hospitalist, Dr. Michell Ahumada-- uncomfortable admitting patient here.  Feels he needs transfer to tertiary  center with hepatology services available.  Call out to [facility] where patient has had prior work-up for same.  1:56 AM Spoke to admitter at [facility]-- unfortunately no bed availability at this time.  He has been placed on wait list, hopeful to have bed available in the  AM.    Patient will need to be admitted here for now, hopeful for lateral transfer in the AM.  I have messaged Dr. Michell Ahumada regarding same.  2:40 AM Spoke with Dr. Michell Ahumada again-- still refusing admission at this time.  States day time team can provide consultation or discuss with administration.  I do not feel this is in patient's best interest as he needs closer monitoring and admission as opposed to lingering in the ER.  Again reminded that he is on WAIT LIST and admission here would only be temporary, although he has been admitted here previously with similar numbers. Dr. Michell Ahumada has requested local GI consultation now to see if they can even assist with management while here.    2:48 AM Spoke with on call GI, Dr. Brice Campi-- agrees needs tertiary center, however will put patient on list to eval in the morning if still here/admitted temporarily.  Can consider hepatitis panel, although has known hep B.  May need paracentesis with labs, abdominal imaging, etc.  Recommended to add UA with culture, urine sodium, urine creatinine to eval for hepatorenal syndrome.  Also requested direct bili.  These additional tests have been ordered.  2:59 AM Have updated family-- aware of delays in care and need for tertiary care center.  Discussed that we can consider [facility] and [facility] as well.  Patient has been seen at [facility] for this, he has several specialists (GI, opthalmology, etc) he is already seeing at [facility] so they would prefer to stay within that system.  This seems reasonable.    Currently patient is awake/alert.  He is drinking water  and remains HD stable.  Hospitalist updated once again and notified GI will assist while patient is still in our  facility.    6:25 AM Remains HD stable overnight.  Awaiting temporary admission here if bed still not available this AM at [facility].    Final Clinical Impression(s) / ED Diagnoses Final diagnoses:  Hepatic encephalopathy (HCC)  Elevated bilirubin  AKI (acute kidney injury) [facility])    Rx / DC Orders ED Discharge Orders     None         Coretha Dew, PA-C 04/17/24 1610    Rolinda Climes, DO 04/17/24 1529

## 2024-04-16 NOTE — ED Triage Notes (Signed)
 Pt BIB [facility] from home after he collapsed and became unresponsive with family. Pt seen previously for similar episodes. Family states that these are seizures but no formal diagnosis. Pt had 4-5 witnessed "episodes" with EMS and had to be put on NRB and jaw thrust to maintain airway.  ABD pain with palpation 160/p 63HR 23RR 100% RA

## 2024-04-17 ENCOUNTER — Inpatient Hospital Stay (HOSPITAL_COMMUNITY)

## 2024-04-17 ENCOUNTER — Emergency Department (HOSPITAL_COMMUNITY)

## 2024-04-17 DIAGNOSIS — F17210 Nicotine dependence, cigarettes, uncomplicated: Secondary | ICD-10-CM | POA: Diagnosis present

## 2024-04-17 DIAGNOSIS — R64 Cachexia: Secondary | ICD-10-CM | POA: Diagnosis present

## 2024-04-17 DIAGNOSIS — Z5982 Transportation insecurity: Secondary | ICD-10-CM | POA: Diagnosis not present

## 2024-04-17 DIAGNOSIS — E785 Hyperlipidemia, unspecified: Secondary | ICD-10-CM | POA: Diagnosis present

## 2024-04-17 DIAGNOSIS — I130 Hypertensive heart and chronic kidney disease with heart failure and stage 1 through stage 4 chronic kidney disease, or unspecified chronic kidney disease: Secondary | ICD-10-CM | POA: Diagnosis present

## 2024-04-17 DIAGNOSIS — Z7984 Long term (current) use of oral hypoglycemic drugs: Secondary | ICD-10-CM | POA: Diagnosis not present

## 2024-04-17 DIAGNOSIS — Z888 Allergy status to other drugs, medicaments and biological substances status: Secondary | ICD-10-CM | POA: Diagnosis not present

## 2024-04-17 DIAGNOSIS — D6959 Other secondary thrombocytopenia: Secondary | ICD-10-CM | POA: Diagnosis present

## 2024-04-17 DIAGNOSIS — K7682 Hepatic encephalopathy: Principal | ICD-10-CM | POA: Diagnosis present

## 2024-04-17 DIAGNOSIS — Z79899 Other long term (current) drug therapy: Secondary | ICD-10-CM

## 2024-04-17 DIAGNOSIS — B181 Chronic viral hepatitis B without delta-agent: Secondary | ICD-10-CM | POA: Diagnosis present

## 2024-04-17 DIAGNOSIS — K7460 Unspecified cirrhosis of liver: Secondary | ICD-10-CM | POA: Diagnosis present

## 2024-04-17 DIAGNOSIS — N1831 Chronic kidney disease, stage 3a: Secondary | ICD-10-CM | POA: Diagnosis present

## 2024-04-17 DIAGNOSIS — D509 Iron deficiency anemia, unspecified: Secondary | ICD-10-CM | POA: Diagnosis present

## 2024-04-17 DIAGNOSIS — R5383 Other fatigue: Secondary | ICD-10-CM | POA: Diagnosis present

## 2024-04-17 DIAGNOSIS — R188 Other ascites: Secondary | ICD-10-CM | POA: Diagnosis present

## 2024-04-17 DIAGNOSIS — N179 Acute kidney failure, unspecified: Secondary | ICD-10-CM | POA: Diagnosis present

## 2024-04-17 DIAGNOSIS — R5381 Other malaise: Secondary | ICD-10-CM | POA: Diagnosis present

## 2024-04-17 DIAGNOSIS — Z7982 Long term (current) use of aspirin: Secondary | ICD-10-CM | POA: Diagnosis not present

## 2024-04-17 DIAGNOSIS — I340 Nonrheumatic mitral (valve) insufficiency: Secondary | ICD-10-CM | POA: Diagnosis present

## 2024-04-17 DIAGNOSIS — Z6832 Body mass index (BMI) 32.0-32.9, adult: Secondary | ICD-10-CM

## 2024-04-17 DIAGNOSIS — K767 Hepatorenal syndrome: Secondary | ICD-10-CM | POA: Diagnosis present

## 2024-04-17 DIAGNOSIS — I5022 Chronic systolic (congestive) heart failure: Secondary | ICD-10-CM | POA: Diagnosis present

## 2024-04-17 DIAGNOSIS — K7290 Hepatic failure, unspecified without coma: Secondary | ICD-10-CM | POA: Diagnosis not present

## 2024-04-17 DIAGNOSIS — E1122 Type 2 diabetes mellitus with diabetic chronic kidney disease: Secondary | ICD-10-CM | POA: Diagnosis present

## 2024-04-17 DIAGNOSIS — R1084 Generalized abdominal pain: Secondary | ICD-10-CM | POA: Diagnosis not present

## 2024-04-17 DIAGNOSIS — R17 Unspecified jaundice: Secondary | ICD-10-CM | POA: Diagnosis present

## 2024-04-17 DIAGNOSIS — R4182 Altered mental status, unspecified: Secondary | ICD-10-CM

## 2024-04-17 DIAGNOSIS — Z743 Need for continuous supervision: Secondary | ICD-10-CM | POA: Diagnosis not present

## 2024-04-17 DIAGNOSIS — I10 Essential (primary) hypertension: Secondary | ICD-10-CM | POA: Diagnosis not present

## 2024-04-17 LAB — GRAM STAIN

## 2024-04-17 LAB — BODY FLUID CELL COUNT WITH DIFFERENTIAL
Lymphs, Fluid: 50 %
Monocyte-Macrophage-Serous Fluid: 49 % — ABNORMAL LOW (ref 50–90)
Neutrophil Count, Fluid: 1 % (ref 0–25)
Total Nucleated Cell Count, Fluid: 155 uL (ref 0–1000)

## 2024-04-17 LAB — BILIRUBIN, DIRECT: Bilirubin, Direct: 9.9 mg/dL — ABNORMAL HIGH (ref 0.0–0.2)

## 2024-04-17 LAB — GLUCOSE, CAPILLARY
Glucose-Capillary: 105 mg/dL — ABNORMAL HIGH (ref 70–99)
Glucose-Capillary: 152 mg/dL — ABNORMAL HIGH (ref 70–99)
Glucose-Capillary: 158 mg/dL — ABNORMAL HIGH (ref 70–99)

## 2024-04-17 LAB — CBC
HCT: 26 % — ABNORMAL LOW (ref 39.0–52.0)
Hemoglobin: 8.9 g/dL — ABNORMAL LOW (ref 13.0–17.0)
MCH: 23.1 pg — ABNORMAL LOW (ref 26.0–34.0)
MCHC: 34.2 g/dL (ref 30.0–36.0)
MCV: 67.5 fL — ABNORMAL LOW (ref 80.0–100.0)
Platelets: 155 10*3/uL (ref 150–400)
RBC: 3.85 MIL/uL — ABNORMAL LOW (ref 4.22–5.81)
RDW: 25.2 % — ABNORMAL HIGH (ref 11.5–15.5)
WBC: 8.8 10*3/uL (ref 4.0–10.5)
nRBC: 0 % (ref 0.0–0.2)

## 2024-04-17 LAB — HEMOGLOBIN A1C
Hgb A1c MFr Bld: 4.7 % — ABNORMAL LOW (ref 4.8–5.6)
Mean Plasma Glucose: 88.19 mg/dL

## 2024-04-17 LAB — SODIUM, URINE, RANDOM: Sodium, Ur: <10 mmol/L

## 2024-04-17 LAB — IRON AND TIBC
Iron: 117 ug/dL (ref 45–182)
Saturation Ratios: 81 % — ABNORMAL HIGH (ref 17.9–39.5)
TIBC: 144 ug/dL — ABNORMAL LOW (ref 250–450)
UIBC: 27 ug/dL

## 2024-04-17 LAB — GLUCOSE, PLEURAL OR PERITONEAL FLUID: Glucose, Fluid: 108 mg/dL

## 2024-04-17 LAB — PROTEIN, PLEURAL OR PERITONEAL FLUID: Total protein, fluid: <3 g/dL

## 2024-04-17 LAB — CREATININE, SERUM
Creatinine, Ser: 2.26 mg/dL — ABNORMAL HIGH (ref 0.61–1.24)
GFR, Estimated: 35 mL/min — ABNORMAL LOW (ref >60)

## 2024-04-17 LAB — CREATININE, URINE, RANDOM: Creatinine, Urine: 183 mg/dL

## 2024-04-17 MED ORDER — AMLODIPINE BESYLATE 10 MG PO TABS
10.0000 mg | ORAL_TABLET | Freq: Every day | ORAL | Status: DC
  Administered 2024-04-17 – 2024-04-18 (×2): 10 mg via ORAL
  Filled 2024-04-17 (×2): qty 1

## 2024-04-17 MED ORDER — INSULIN ASPART 100 UNIT/ML IJ SOLN
0.0000 [IU] | Freq: Three times a day (TID) | INTRAMUSCULAR | Status: DC
  Administered 2024-04-17: 3 [IU] via SUBCUTANEOUS

## 2024-04-17 MED ORDER — ONDANSETRON HCL 4 MG PO TABS: 4.0000 mg | ORAL_TABLET | Freq: Four times a day (QID) | ORAL | Status: DC | PRN

## 2024-04-17 MED ORDER — ALBUMIN HUMAN 25 % IV SOLN
25.0000 g | Freq: Four times a day (QID) | INTRAVENOUS | Status: DC
Start: 2024-04-17 — End: 2024-04-18
  Administered 2024-04-17 – 2024-04-18 (×3): 25 g via INTRAVENOUS
  Filled 2024-04-17 (×4): qty 100

## 2024-04-17 MED ORDER — INSULIN ASPART 100 UNIT/ML IJ SOLN: 0.0000 [IU] | Freq: Every day | INTRAMUSCULAR | Status: DC

## 2024-04-17 MED ORDER — HEPARIN SODIUM (PORCINE) 5000 UNIT/ML IJ SOLN
5000.0000 [IU] | Freq: Three times a day (TID) | INTRAMUSCULAR | Status: DC
  Administered 2024-04-17 – 2024-04-18 (×5): 5000 [IU] via SUBCUTANEOUS
  Filled 2024-04-17 (×5): qty 1

## 2024-04-17 MED ORDER — HYDRALAZINE HCL 25 MG PO TABS
25.0000 mg | ORAL_TABLET | Freq: Three times a day (TID) | ORAL | Status: DC
  Administered 2024-04-17 (×3): 25 mg via ORAL
  Filled 2024-04-17 (×4): qty 1

## 2024-04-17 MED ORDER — LIDOCAINE HCL (PF) 1 % IJ SOLN
INTRAMUSCULAR | Status: AC
Start: 2024-04-17 — End: ?
  Filled 2024-04-17: qty 30

## 2024-04-17 MED ORDER — ONDANSETRON HCL 4 MG/2ML IJ SOLN: 4.0000 mg | Freq: Four times a day (QID) | INTRAMUSCULAR | Status: DC | PRN

## 2024-04-17 MED ORDER — LIDOCAINE HCL (PF) 1 % IJ SOLN
10.0000 mL | Freq: Once | INTRAMUSCULAR | Status: AC
  Administered 2024-04-17: 10 mL via INTRADERMAL

## 2024-04-17 MED ORDER — LACTULOSE 10 GM/15ML PO SOLN
20.0000 g | Freq: Two times a day (BID) | ORAL | Status: DC
  Administered 2024-04-17 – 2024-04-18 (×2): 20 g via ORAL
  Filled 2024-04-17 (×4): qty 30

## 2024-04-17 NOTE — H&P (Addendum)
 History and Physical    [name]:[id number] DOB: June 08, 1976 DOA: 04/16/2024  PCP: [name], PA-C  Patient coming from: Home  I have personally briefly reviewed patient's old medical records in [facility]  Chief Complaint: Generalized body ache, arthralgia, abdominal distention  HPI: Sean Foster is a 48 y.o. 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  Apparently he collapsed at home per family and had an episode of shaking.  Patient reports since yesterday he was not feeling well, have generalized body pain, joint pain, no energy and noticed abdominal distention.  He had not had bowel movement this morning and reports some discomfort in his abdomen.  He feels heavy overall.  He denies fever, chills, nausea, vomiting, chest pain or shortness of breath.  Of note: Patient was admitted on 03/20/2024 with subacute liver failure decompensated with ascites.  During that hospitalization seizures were ruled out.  He sees hepatologist at [facility].  He was started on Baraclude about 3 weeks ago.  He reports tobacco abuse however denies any alcohol or any illicit drug use.  ED Course: Upon arrival to ED: Afebrile, pulse 64, RR: 14, BP 160/83, maintaining oxygen saturation on room air.  CBC shows WBC of 8.5, H&H 8.5, PLT 143, NA: 134, K: 4.2, BUN 29, creatinine 2.37, AST 206, ALT 15, ALP 145, total bilirubin 18.8, GFR 33, INR 1.2, ammonia 89, direct bilirubin 9.9.  CT head questionable small scalp soft tissue abscess versus seroma.  Chest x-ray negative, UA pending.  Initially plan was to transfer patient to transfer patient to tertiary care hospital at [facility] however unfortunately no beds were  available at that time.  GI consulted-was also recommended transfer to tertiary care.  Patient will be admitted here and will be transferred to [facility] once bed available.  Review of Systems: As per HPI otherwise negative.    Past Medical History:  Diagnosis Date   Diabetes mellitus without complication (HCC)    Hypertension     Past Surgical History:  Procedure Laterality Date   INCISION AND DRAINAGE ABSCESS Left 04/16/2020   Procedure: INCISION AND DRAINAGE BUTTOCK ABSCESS;  Surgeon: Oralee Billow, MD;  Location: [facility] ORS;  Service: General;  Laterality: Left;   RETINAL DETACHMENT SURGERY Right 03/08/2024     reports that he has been smoking cigarettes. He has never used smokeless tobacco. He reports that he does not currently use alcohol. He reports that he does not currently use drugs.  Allergies  Allergen Reactions   Chloroquine Itching   Chlorothen Hives, Itching and Other (See Comments)    "Chlorothen (trade name Forensic scientist) is an antihistamine and anticholinergic"    History reviewed. No pertinent family history.  Prior to Admission medications   Medication Sig Start Date End Date Taking? Authorizing Provider  amLODipine  (NORVASC ) 10 MG tablet Take 10 mg by mouth daily.   Yes [provider]  aspirin EC 81 MG tablet Take 81 mg by mouth daily. Swallow whole.   Yes [provider]  atorvastatin  (LIPITOR) 40 MG tablet Take 1 tablet (40 mg total) by mouth daily. 03/23/24 06/21/24 Yes Ghimire, Letty Raya, MD  doxazosin  (CARDURA ) 2 MG tablet Take 1 tablet (2 mg total) by mouth at bedtime. 03/23/24  Yes Vada Garibaldi, MD  entecavir (BARACLUDE) 1 MG tablet Take 1 mg by mouth daily. 04/01/24  Yes [provider]  hydrALAZINE  (APRESOLINE ) 25 MG tablet Take 1 tablet (25 mg total) by mouth 3 (three) times daily. 03/23/24 06/21/24 Yes Ghimire, Kuber, MD  prednisoLONE acetate (PRED FORTE) 1 % ophthalmic suspension Place 1 drop into the right eye 2 (two) times daily. 03/16/24   Yes [provider]    Physical Exam: Vitals:   04/17/24 0400 04/17/24 0700 04/17/24 0929 04/17/24 0930  BP: (!) 152/92 (!) 149/88 (!) 153/92 (!) 153/92  Pulse: 63  72 71  Resp: 17 17 18    Temp: 98 F (36.7 C)  97.9 F (36.6 C) 97.9 F (36.6 C)  TempSrc: Oral  Oral Oral  SpO2: 100%  100% 100%  Weight:      Height:        Constitutional: NAD, calm, comfortable, on room air, communicating well Eyes: Icteric  ENMT: Mucous membranes are moist. Posterior pharynx clear of any exudate or lesions.Normal dentition.  Neck: normal, supple, no masses, no thyromegaly Respiratory: clear to auscultation bilaterally, no wheezing, no crackles. Normal respiratory effort. No accessory muscle use.  Cardiovascular: Regular rate and rhythm, no murmurs / rubs / gallops. No extremity edema. 2+ pedal pulses. No carotid bruits.  Abdomen: Soft, distended, nontender, no hepatosplenomegaly.,  Bowel sounds present Musculoskeletal: no clubbing / cyanosis. No joint deformity upper and lower extremities. Good ROM, no contractures. Normal muscle tone.  Skin: no rashes, lesions, ulcers. No induration Neurologic: CN 2-12 grossly intact. Sensation intact, DTR normal. Strength 5/5 in all 4.  Psychiatric: Normal judgment and insight. Alert and oriented x 3. Normal mood.    Labs on Admission: I have personally reviewed following labs and imaging studies  CBC: Recent Labs  Lab 04/16/24 2222  WBC 8.5  NEUTROABS 5.4  HGB 8.5*  HCT 25.4*  MCV 69.0*  PLT 143*   Basic Metabolic Panel: Recent Labs  Lab 04/16/24 2222  NA 134*  K 4.2  CL 107  CO2 16*  GLUCOSE 148*  BUN 29*  CREATININE 2.37*  CALCIUM  8.1*   GFR: Estimated Creatinine Clearance: 46.1 mL/min (A) (by C-G formula based on SCr of 2.37 mg/dL (H)). Liver Function Tests: Recent Labs  Lab 04/16/24 2222  AST 206*  ALT 15  ALKPHOS 145*  BILITOT 18.8*  PROT 6.2*  ALBUMIN  2.6*   No results for input(s): "LIPASE", "AMYLASE" in the  last 168 hours. Recent Labs  Lab 04/16/24 2222  AMMONIA 89*   Coagulation Profile: Recent Labs  Lab 04/16/24 2222  INR 1.6*   Cardiac Enzymes: No results for input(s): "CKTOTAL", "CKMB", "CKMBINDEX", "TROPONINI" in the last 168 hours. BNP (last 3 results) No results for input(s): "PROBNP" in the last 8760 hours. HbA1C: No results for input(s): "HGBA1C" in the last 72 hours. CBG: No results for input(s): "GLUCAP" in the last 168 hours. Lipid Profile: No results for input(s): "CHOL", "HDL", "LDLCALC", "TRIG", "CHOLHDL", "LDLDIRECT" in the last 72 hours. Thyroid Function Tests: No results for input(s): "TSH", "T4TOTAL", "FREET4", "T3FREE", "THYROIDAB" in the last 72 hours. Anemia Panel: No results for input(s): "VITAMINB12", "FOLATE", "FERRITIN", "TIBC", "IRON", "RETICCTPCT" in the last 72 hours. Urine analysis:    Component Value Date/Time   COLORURINE AMBER (A) 03/20/2024 1730   APPEARANCEUR CLEAR 03/20/2024 1730   LABSPEC 1.029 03/20/2024 1730   PHURINE 5.0 03/20/2024 1730   GLUCOSEU NEGATIVE 03/20/2024 1730   HGBUR SMALL (A) 03/20/2024 1730   BILIRUBINUR MODERATE (A) 03/20/2024 1730  KETONESUR NEGATIVE 03/20/2024 1730   PROTEINUR >=300 (A) 03/20/2024 1730   NITRITE NEGATIVE 03/20/2024 1730   LEUKOCYTESUR NEGATIVE 03/20/2024 1730    Radiological Exams on Admission: CT HEAD WO CONTRAST ( ) Result Date: 04/16/2024 CLINICAL DATA:  Altered mental status. EXAM: CT HEAD WITHOUT CONTRAST TECHNIQUE: Contiguous axial images were obtained from the base of the skull through the vertex without intravenous contrast. RADIATION DOSE REDUCTION: This exam was performed according to the departmental dose-optimization program which includes automated exposure control, adjustment of the mA and/or kV according to patient size and/or use of iterative reconstruction technique. COMPARISON:  February 16, 2024 FINDINGS: Brain: No evidence of acute infarction, hemorrhage, hydrocephalus, extra-axial  collection or mass lesion/mass effect. Vascular: No hyperdense vessel or unexpected calcification. Skull: A posterior parietal craniotomy defect is seen along the vertex. Sinuses/Orbits: No acute finding. Other: A 1.7 cm x 2.8 cm well-defined area of low attenuation (approximately 10.93 Hounsfield units is seen within the scalp soft tissues of the posterior fossa, slightly to the right of midline. An ill-defined, 1.2 cm x 1.4 cm area of low attenuation (approximately 47.65 Hounsfield units) is seen within this region on the prior study. IMPRESSION: 1. No acute intracranial abnormality. 2. Findings which may represent a small scalp soft tissue abscess versus seroma. Correlation with physical examination is recommended. Electronically Signed   By: Virgle Grime M.D.   On: 04/16/2024 23:53   DG Chest Port 1 View Result Date: 04/16/2024 CLINICAL DATA:  AMS EXAM: PORTABLE CHEST 1 VIEW COMPARISON:  None Available. FINDINGS: Prominent cardiac silhouette likely exaggerated by AP portable technique and low lung volumes. The heart and mediastinal contours are grossly unchanged. Low lung volumes. No focal consolidation. No pulmonary edema. No pleural effusion. No pneumothorax. No acute osseous abnormality. IMPRESSION: Low lung volumes with no active disease. Electronically Signed   By: Morgane  Naveau M.D.   On: 04/16/2024 22:45    Assessment/Plan  Altered mental status - Likely in the setting of hepatic encephalopathy.  Ammonia elevated upon arrival.  CT head negative -Admitted 02/2024.  Workup for seizures negative at that time. - Will continue lactulose  20 g twice a day - GI on board.  Appreciate help  Decompensated cirrhosis Chronic hepatitis B Ascites: Elevated liver enzymes/hyperbilirubinemia -Plan is paracentesis this morning.  IV albumin  every 6-hour x 4-per GI - Check blood culture, urine sodium.  Continue lactulose  - MELD score: 34  -INR 1.2.  Continue to monitor  AKI: - Could be  hepatorenal syndrome? - Not on diuretics/NSAIDs at home.  Monitor renal function closely.  Avoid nephrotoxic medications.  Type 2 diabetes: Check A1c. Start on sliding scale insulin .  Hypertension: - Resume home medications amlodipine  and hydralazine .  Hold on doxazosin .  Monitor blood pressure closely and resume doxazosin  if blood pressure remains elevated.  Hyperlipidemia: Hold on statin at this time due to worsening liver enzymes   Microcytic anemia: - H&H is 8.5.  MCV 69.0.  Baseline H&H between 9-10. - Check iron studies.  Thrombocytopenia likely in the setting of liver cirrhosis. - Continue to monitor  tobacco abuse: Recommend cessation  DVT prophylaxis: Heparin Code Status: Full code Family Communication: None present at bedside.  Plan of care discussed with patient in length and he verbalized understanding and agreed with it. Disposition Plan: To be determined Consults called: GI Admission status: Inpatient   Elester Grim MD [facility]  If 7PM-7AM, please contact night-coverage [email or web address]  04/17/2024, 10:42 AM

## 2024-04-17 NOTE — ED Notes (Signed)
 Pt sitting upright and comfortable. Pt provided water .

## 2024-04-17 NOTE — ED Provider Notes (Signed)
  Physical Exam  BP (!) 152/92 (BP Location: Right Arm)   Pulse 63   Temp 98 F (36.7 C) (Oral)   Resp 17   Ht 5\' 10"  (1.778 m)   Wt 102 kg   SpO2 100%   BMI 32.27 kg/m   Physical Exam  Procedures  Procedures  ED Course / MDM    Medical Decision Making Amount and/or Complexity of Data Reviewed Labs: ordered. Radiology: ordered. ECG/medicine tests: ordered.  Risk Prescription drug management. Decision regarding hospitalization.   Pending admission  At 8:00 hours pending admission I checked with Flow Manager regarding whether or not this patient was assigned to a hospitalist. She confirmed Dr. Lilyan Remedies was to see and admit the patient from the ED.       Mandy Second, PA-C 04/17/24 1610    Rory Collard, MD 04/19/24 [phone]

## 2024-04-17 NOTE — Plan of Care (Signed)

## 2024-04-17 NOTE — ED Notes (Signed)
 Pt says he is unable to provide urine sample at this time, pt given cup of water  and will try again later

## 2024-04-17 NOTE — Consult Note (Signed)
 Consultation  Referring Provider: ER MD/Upstill PA-C Primary Care Physician:  [name], PA-C Primary Gastroenterologist: [facility]/Sarah Marla Sills  Reason for Consultation: Altered mental status  HPI: Sean Foster is a 48 y.o. 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  He was hospitalized here last month.  Presented with abdominal pain and poor appetite, and shakiness with concern for seizures. At that time he was found to have subacute liver failure, and has since been seen by hepatology at [facility].  He was started on Baraclude, about 3 weeks ago. During that admission he was seen by neurology and seizure disorder was ruled out.  Had mild acute kidney injury during that admission thought secondary to volume depletion.  Patient was brought to the ER earlier this morning by family due to concerns with altered mental status.  Per the chart he apparently collapsed at home and became unresponsive, family reported episodes of "shaking during this episode but he does not have diagnosis of seizure disorder.  He was alert and able to communicate on arrival stating that he felt "out of it over the past day or so.  This morning patient is mentating well, does not have any evidence of asterixis.  He says he was feeling well and in his usual state of health until yesterday when he started feeling fatigued, malaise, and noted that all of his joints were hurting.  He did not have any documented fever or chills at home, no nausea or vomiting, no headache.  He says his whole body feels heavy.  He does feel that he has developed some increased fluid in his abdomen and said he was having some lower abdominal discomfort when trying to ambulate yesterday.  Chest x-ray on admission negative CT of the head-previous craniotomy  defect possible small scalp abscess  Labs-pro time 19.1/INR 1.6 Venous ammonia 89 EtOH negative drug screen pending WBC 8.5/hemoglobin 8.5/hematocrit 25.4/MCV 69/platelets 143 Sodium 134/potassium 4.2/BUN 29/creatinine 2.37 albumin  2.6 T. bili 18.8/alk phos 145/AST 206/ALT 15 (no big change over the past month) UA pending  Patient has not been on any regular lactulose  at home, and has not been on recent diuretics.   Past Medical History:  Diagnosis Date   Diabetes mellitus without complication (HCC)    Hypertension     Past Surgical History:  Procedure Laterality Date   INCISION AND DRAINAGE ABSCESS Left 04/16/2020   Procedure: INCISION AND DRAINAGE BUTTOCK ABSCESS;  Surgeon: Oralee Billow, MD;  Location: [facility];  Service: General;  Laterality: Left;   RETINAL DETACHMENT SURGERY Right 03/08/2024    Prior to Admission medications   Medication Sig Start Date End Date Taking? Authorizing Provider  amLODipine  (NORVASC ) 10 MG tablet Take 10 mg by mouth daily.   Yes [provider]  aspirin EC 81 MG tablet Take 81 mg by mouth daily. Swallow whole.   Yes [provider]  atorvastatin  (LIPITOR) 40 MG tablet Take 1 tablet (40 mg total) by mouth daily. 03/23/24 06/21/24 Yes Vada Garibaldi, MD  doxazosin  (CARDURA ) 2 MG tablet Take 1 tablet (2 mg total) by mouth at bedtime. 03/23/24  Yes Ghimire, Kuber, MD  entecavir (BARACLUDE) 1 MG tablet Take 1 mg by mouth daily. 04/01/24  Yes [provider]  hydrALAZINE  (APRESOLINE ) 25 MG tablet Take 1 tablet (25 mg total) by mouth 3 (three) times daily. 03/23/24 06/21/24 Yes  Vada Garibaldi, MD  prednisoLONE acetate (PRED FORTE) 1 % ophthalmic suspension Place 1 drop into the right eye 2 (two) times daily. 03/16/24  Yes [provider]    Current Facility-Administered Medications  Medication Dose Route Frequency Provider Last Rate Last Admin   albumin  human 25 % solution 25 g  25 g Intravenous Q6H Guerline Happ S, PA-C        lactulose  (CHRONULAC ) 10 GM/15ML solution 20 g  20 g Oral BID Jannis Atkins S, PA-C       Current Outpatient Medications  Medication Sig Dispense Refill   amLODipine  (NORVASC ) 10 MG tablet Take 10 mg by mouth daily.     aspirin EC 81 MG tablet Take 81 mg by mouth daily. Swallow whole.     atorvastatin  (LIPITOR) 40 MG tablet Take 1 tablet (40 mg total) by mouth daily. 30 tablet 2   doxazosin  (CARDURA ) 2 MG tablet Take 1 tablet (2 mg total) by mouth at bedtime. 90 tablet 0   entecavir (BARACLUDE) 1 MG tablet Take 1 mg by mouth daily.     hydrALAZINE  (APRESOLINE ) 25 MG tablet Take 1 tablet (25 mg total) by mouth 3 (three) times daily. 90 tablet 0   prednisoLONE acetate (PRED FORTE) 1 % ophthalmic suspension Place 1 drop into the right eye 2 (two) times daily.      Allergies as of 04/16/2024 - Review Complete 04/16/2024  Allergen Reaction Noted   Chloroquine Itching 05/15/2019   Chlorothen Hives, Itching, and Other (See Comments) 06/25/2017    History reviewed. No pertinent family history.  Social History   Socioeconomic History   Marital status: Married    Spouse name: Not on file   Number of children: Not on file   Years of education: Not on file   Highest education level: Not on file  Occupational History   Not on file  Tobacco Use   Smoking status: Every Day    Current packs/day: 0.50    Types: Cigarettes   Smokeless tobacco: Never  Vaping Use   Vaping status: Never Used  Substance and Sexual Activity   Alcohol use: Not Currently    Comment: 1 drink per week   Drug use: Not Currently   Sexual activity: Not Currently  Other Topics Concern   Not on file  Social History Narrative   ** Merged History Encounter **       Social Drivers of Health   Financial Resource Strain: Not on file  Food Insecurity: Low Risk  (04/02/2024)   Received from [facility]   Hunger Vital Sign    Worried About Running Out of Food in the Last Year: Never true    Ran Out of Food in the  Last Year: Never true  Recent Concern: Food Insecurity - Food Insecurity Present (03/21/2024)   Hunger Vital Sign    Worried About Running Out of Food in the Last Year: Never true    Ran Out of Food in the Last Year: Sometimes true  Transportation Needs: No Transportation Needs (04/02/2024)   Received from [facility]    In the past 12 months, has lack of reliable transportation kept you from medical appointments, meetings, work or from getting things needed for daily living? : No  Recent Concern: Transportation Needs - Unmet Transportation Needs (03/21/2024)   PRAPARE - Administrator, Civil Service (Medical): Yes    Lack of Transportation (Non-Medical): Yes  Physical Activity: Not on file  Stress:  Not on file  Social Connections: Not on file  Intimate Partner Violence: Not At Risk (03/15/2024)   Humiliation, Afraid, Rape, and Kick questionnaire    Fear of Current or Ex-Partner: No    Emotionally Abused: No    Physically Abused: No    Sexually Abused: No    Review of Systems: Pertinent positive and negative review of systems were noted in the above HPI section.  All other review of systems was otherwise negative.   Physical Exam: Vital signs in last 24 hours: Temp:  [97.9 F (36.6 C)-98.4 F (36.9 C)] 97.9 F (36.6 C) (05/24 0930) Pulse Rate:  [63-72] 71 (05/24 0930) Resp:  [14-18] 18 (05/24 0929) BP: (130-160)/(83-92) 153/92 (05/24 0930) SpO2:  [100 %] 100 % (05/24 0930) Weight:  [102 kg] 102 kg (05/23 2218)   General:   Alert,  Well-developed, African male pleasant and cooperative in NAD Head:  Normocephalic and atraumatic. Eyes:  Sclera icteric   Conjunctiva pink. Ears:  Normal auditory acuity. Nose:  No deformity, discharge,  or lesions. Mouth:  No deformity or lesions.   Neck:  Supple; no masses or thyromegaly. Lungs:  Clear throughout to auscultation.   No wheezes, crackles, or rhonchi.  Heart:  Regular rate and rhythm; no murmurs,  clicks, rubs,  or gallops. Abdomen: Large protuberant, nontense ascites nontender, no palpable mass or hepatosplenomegaly, bowel sounds are present Rectal:  not done Msk:  Symmetrical without gross deformities. . Pulses:  Normal pulses noted. Extremities:  Without clubbing or edema. Neurologic:  Alert and  oriented x4;  grossly normal neurologically.no asterixis, mentating well currently Skin:  Intact without significant lesions or rashes.. Psych:  Alert and cooperative. Normal mood and affect.  Intake/Output from previous day: No intake/output data recorded. Intake/Output this shift: No intake/output data recorded.  Lab Results: Recent Labs    04/16/24 2222  WBC 8.5  HGB 8.5*  HCT 25.4*  PLT 143*   BMET Recent Labs    04/16/24 2222  NA 134*  K 4.2  CL 107  CO2 16*  GLUCOSE 148*  BUN 29*  CREATININE 2.37*  CALCIUM  8.1*   LFT Recent Labs    04/16/24 2222 04/17/24 0405  PROT 6.2*  --   ALBUMIN  2.6*  --   AST 206*  --   ALT 15  --   ALKPHOS 145*  --   BILITOT 18.8*  --   BILIDIR  --  9.9*   PT/INR Recent Labs    04/16/24 2222  LABPROT 19.1*  INR 1.6*   Hepatitis Panel No results for input(s): "HEPBSAG", "HCVAB", "HEPAIGM", "HEPBIGM" in the last 72 hours.    IMPRESSION:  #77 48 year old [place] male with chronic hepatitis B, recent diagnosis of decompensated cirrhosis.  Patient just started on Baraclude treatment about 3 weeks ago ([facility])  MELD 3.0: 34 at 04/16/2024 10:22 PM MELD-Na: 32 at 04/16/2024 10:22 PM Calculated from: Serum Creatinine: 2.37 mg/dL at 03/03/8118 14:78 PM Serum Sodium: 134 mmol/L at 04/16/2024 10:22 PM Total Bilirubin: 18.8 mg/dL at 2/95/6213 08:65 PM Serum Albumin : 2.6 g/dL at 7/84/6962 95:28 PM INR(ratio): 1.6 at 04/16/2024 10:22 PM Age at listing (hypothetical): 38 years Sex: Male at 04/16/2024 10:22 PM    Brought to the emergency room after patient apparently had a "collapse" at home with shaking.  Had not been  feeling well over the past 24 hours, admits to feeling very weak fatigued and "out of it, complaining of joint pain and lower abdominal discomfort with ambulating.  He had workup last admission about a month ago by neurology and seizure disorder had been ruled out at that time  This episode may have been secondary to hepatic encephalopathy, does have significantly elevated venous ammonia.  He is mentating well however this morning and has no asterixis.  Etiology of acute decline over the past 24 hours with acute illness-need to rule out underlying infection i.e. SBP UA pending, will check blood cultures CXR-ray negative  #2 acute kidney injury-patient not on diuretics Rule out HRS  #3 diabetes mellitus #4.  Hypertension    PLAN: Paracentesis this morning, remove 1 L, cell counts Gram stain ,cultures Okay for 2 g sodium diet post paracentesis We will start IV albumin  every 6 hours x 4 Await urine sodium Check blood cultures Start lactulose  20 g twice daily GI will follow closely with you   Arica Bevilacqua PA-C 04/17/2024, 9:47 AM

## 2024-04-17 NOTE — ED Notes (Signed)
 Unit contacted that pt is inbound, transport acknowledged and at bedside.

## 2024-04-17 NOTE — ED Notes (Signed)
 Pt daughter at bedside called for nursing staff to come to pts room, pt was experiencing episode of jerky body movement. PA at bedside to assess pt, episode lasting roughly 30 secs. Pt alert and oriented following.

## 2024-04-17 NOTE — ED Notes (Signed)
 Pt transported to US . Then transport will take pt upstairs once US  is finished.

## 2024-04-17 NOTE — Procedures (Signed)
 PROCEDURE SUMMARY:  Successful US  guided paracentesis from right lateral abdomen.  Yielded 3 liters of clear gold fluid.  No immediate complications.  Patient tolerated well.  EBL = trace  Specimen was sent for labs.  Deborha Moseley S Reed Eifert PA-C 04/17/2024 11:21 AM

## 2024-04-18 DIAGNOSIS — K7290 Hepatic failure, unspecified without coma: Secondary | ICD-10-CM

## 2024-04-18 DIAGNOSIS — K7460 Unspecified cirrhosis of liver: Secondary | ICD-10-CM

## 2024-04-18 DIAGNOSIS — E119 Type 2 diabetes mellitus without complications: Secondary | ICD-10-CM

## 2024-04-18 DIAGNOSIS — K7682 Hepatic encephalopathy: Secondary | ICD-10-CM | POA: Diagnosis not present

## 2024-04-18 DIAGNOSIS — R5383 Other fatigue: Secondary | ICD-10-CM

## 2024-04-18 DIAGNOSIS — B181 Chronic viral hepatitis B without delta-agent: Secondary | ICD-10-CM

## 2024-04-18 DIAGNOSIS — R188 Other ascites: Secondary | ICD-10-CM

## 2024-04-18 DIAGNOSIS — D696 Thrombocytopenia, unspecified: Secondary | ICD-10-CM

## 2024-04-18 DIAGNOSIS — N179 Acute kidney failure, unspecified: Secondary | ICD-10-CM

## 2024-04-18 DIAGNOSIS — D509 Iron deficiency anemia, unspecified: Secondary | ICD-10-CM | POA: Diagnosis not present

## 2024-04-18 DIAGNOSIS — Z8679 Personal history of other diseases of the circulatory system: Secondary | ICD-10-CM

## 2024-04-18 LAB — COMPREHENSIVE METABOLIC PANEL WITH GFR
ALT: 16 U/L (ref 0–44)
AST: 179 U/L — ABNORMAL HIGH (ref 15–41)
Albumin: 2.8 g/dL — ABNORMAL LOW (ref 3.5–5.0)
Alkaline Phosphatase: 115 U/L (ref 38–126)
Anion gap: 6 (ref 5–15)
BUN: 30 mg/dL — ABNORMAL HIGH (ref 6–20)
CO2: 19 mmol/L — ABNORMAL LOW (ref 22–32)
Calcium: 8.1 mg/dL — ABNORMAL LOW (ref 8.9–10.3)
Chloride: 111 mmol/L (ref 98–111)
Creatinine, Ser: 2.24 mg/dL — ABNORMAL HIGH (ref 0.61–1.24)
GFR, Estimated: 35 mL/min — ABNORMAL LOW (ref >60)
Glucose, Bld: 113 mg/dL — ABNORMAL HIGH (ref 70–99)
Potassium: 3.5 mmol/L (ref 3.5–5.1)
Sodium: 136 mmol/L (ref 135–145)
Total Bilirubin: 17 mg/dL — ABNORMAL HIGH (ref 0.0–1.2)
Total Protein: 6.1 g/dL — ABNORMAL LOW (ref 6.5–8.1)

## 2024-04-18 LAB — CBC WITH DIFFERENTIAL/PLATELET
Abs Immature Granulocytes: 0.02 10*3/uL (ref 0.00–0.07)
Abs Immature Granulocytes: 0 10*3/uL (ref 0.00–0.07)
Basophils Absolute: 0 10*3/uL (ref 0.0–0.1)
Basophils Absolute: 0 10*3/uL (ref 0.0–0.1)
Basophils Relative: 0 %
Basophils Relative: 0 %
Eosinophils Absolute: 0.1 10*3/uL (ref 0.0–0.5)
Eosinophils Absolute: 0.3 10*3/uL (ref 0.0–0.5)
Eosinophils Relative: 2 %
Eosinophils Relative: 3 %
HCT: 21.9 % — ABNORMAL LOW (ref 39.0–52.0)
HCT: 24.9 % — ABNORMAL LOW (ref 39.0–52.0)
Hemoglobin: 7.6 g/dL — ABNORMAL LOW (ref 13.0–17.0)
Hemoglobin: 8.4 g/dL — ABNORMAL LOW (ref 13.0–17.0)
Immature Granulocytes: 0 %
Lymphocytes Relative: 31 %
Lymphocytes Relative: 28 %
Lymphs Abs: 2.7 10*3/uL (ref 0.7–4.0)
Lymphs Abs: 2.7 10*3/uL (ref 0.7–4.0)
MCH: 23.5 pg — ABNORMAL LOW (ref 26.0–34.0)
MCH: 23.5 pg — ABNORMAL LOW (ref 26.0–34.0)
MCHC: 34.7 g/dL (ref 30.0–36.0)
MCHC: 33.7 g/dL (ref 30.0–36.0)
MCV: 67.6 fL — ABNORMAL LOW (ref 80.0–100.0)
MCV: 69.6 fL — ABNORMAL LOW (ref 80.0–100.0)
Monocytes Absolute: 1.5 10*3/uL — ABNORMAL HIGH (ref 0.1–1.0)
Monocytes Absolute: 0.6 10*3/uL (ref 0.1–1.0)
Monocytes Relative: 17 %
Monocytes Relative: 6 %
Neutro Abs: 4.5 10*3/uL (ref 1.7–7.7)
Neutro Abs: 6 10*3/uL (ref 1.7–7.7)
Neutrophils Relative %: 50 %
Neutrophils Relative %: 63 %
Platelets: 143 10*3/uL — ABNORMAL LOW (ref 150–400)
Platelets: 141 10*3/uL — ABNORMAL LOW (ref 150–400)
RBC: 3.24 MIL/uL — ABNORMAL LOW (ref 4.22–5.81)
RBC: 3.58 MIL/uL — ABNORMAL LOW (ref 4.22–5.81)
RDW: 25.1 % — ABNORMAL HIGH (ref 11.5–15.5)
RDW: 25.1 % — ABNORMAL HIGH (ref 11.5–15.5)
WBC: 8.9 10*3/uL (ref 4.0–10.5)
WBC: 9.5 10*3/uL (ref 4.0–10.5)
nRBC: 0.2 % (ref 0.0–0.2)
nRBC: 0 % (ref 0.0–0.2)
nRBC: 0 /100{WBCs}

## 2024-04-18 LAB — GLUCOSE, CAPILLARY
Glucose-Capillary: 108 mg/dL — ABNORMAL HIGH (ref 70–99)
Glucose-Capillary: 112 mg/dL — ABNORMAL HIGH (ref 70–99)
Glucose-Capillary: 155 mg/dL — ABNORMAL HIGH (ref 70–99)
Glucose-Capillary: 182 mg/dL — ABNORMAL HIGH (ref 70–99)

## 2024-04-18 LAB — URINALYSIS, ROUTINE W REFLEX MICROSCOPIC
Glucose, UA: NEGATIVE mg/dL
Ketones, ur: NEGATIVE mg/dL
Leukocytes,Ua: NEGATIVE
Nitrite: NEGATIVE
Protein, ur: >=300 mg/dL — AB
Specific Gravity, Urine: 1.015 (ref 1.005–1.030)
pH: 5 (ref 5.0–8.0)

## 2024-04-18 LAB — RAPID URINE DRUG SCREEN, HOSP PERFORMED
Amphetamines: NOT DETECTED
Barbiturates: NOT DETECTED
Benzodiazepines: NOT DETECTED
Cocaine: NOT DETECTED
Opiates: NOT DETECTED
Tetrahydrocannabinol: NOT DETECTED

## 2024-04-18 LAB — URINALYSIS, W/ REFLEX TO CULTURE (INFECTION SUSPECTED)
Bacteria, UA: NONE SEEN
Glucose, UA: NEGATIVE mg/dL
Ketones, ur: NEGATIVE mg/dL
Leukocytes,Ua: NEGATIVE
Nitrite: NEGATIVE
Protein, ur: >=300 mg/dL — AB
Specific Gravity, Urine: 1.015 (ref 1.005–1.030)
pH: 5 (ref 5.0–8.0)

## 2024-04-18 LAB — AMMONIA: Ammonia: 60 umol/L — ABNORMAL HIGH (ref 9–35)

## 2024-04-18 LAB — PROTIME-INR
INR: 1.7 — ABNORMAL HIGH (ref 0.8–1.2)
Prothrombin Time: 20.3 s — ABNORMAL HIGH (ref 11.4–15.2)

## 2024-04-18 LAB — APTT: aPTT: 55 s — ABNORMAL HIGH (ref 24–36)

## 2024-04-18 LAB — PROTEIN / CREATININE RATIO, URINE
Creatinine, Urine: 131 mg/dL
Protein Creatinine Ratio: 2.18 mg/mg{creat} — ABNORMAL HIGH (ref 0.00–0.15)
Total Protein, Urine: 286 mg/dL

## 2024-04-18 LAB — MAGNESIUM: Magnesium: 2.3 mg/dL (ref 1.7–2.4)

## 2024-04-18 LAB — LACTIC ACID, PLASMA: Lactic Acid, Venous: 1.9 mmol/L (ref 0.5–1.9)

## 2024-04-18 MED ORDER — OXYCODONE HCL 5 MG PO TABS
5.0000 mg | ORAL_TABLET | Freq: Three times a day (TID) | ORAL | Status: DC | PRN
  Administered 2024-04-18 – 2024-04-19 (×2): 5 mg via ORAL
  Filled 2024-04-18 (×2): qty 1

## 2024-04-18 MED ORDER — DARBEPOETIN ALFA 200 MCG/0.4ML IJ SOSY
200.0000 ug | PREFILLED_SYRINGE | INTRAMUSCULAR | Status: DC
  Administered 2024-04-18: 200 ug via SUBCUTANEOUS
  Filled 2024-04-18: qty 0.4

## 2024-04-18 MED ORDER — ALBUMIN HUMAN 25 % IV SOLN
25.0000 g | Freq: Four times a day (QID) | INTRAVENOUS | Status: DC
  Administered 2024-04-18 (×2): 25 g via INTRAVENOUS
  Filled 2024-04-18 (×2): qty 100

## 2024-04-18 MED ORDER — OCTREOTIDE ACETATE 100 MCG/ML IJ SOLN
100.0000 ug | Freq: Two times a day (BID) | INTRAMUSCULAR | Status: DC
  Administered 2024-04-18: 100 ug via SUBCUTANEOUS
  Filled 2024-04-18 (×2): qty 1

## 2024-04-18 NOTE — Progress Notes (Addendum)
 Patient ID: Sean Foster, male   DOB: 1975/12/27, 48 y.o.   MRN: [id number]    Progress Note   Subjective   Day # 2 CC; altered mental status in setting of recent diagnosis of decompensated cirrhosis secondary to chronic hepatitis B  Paracentesis yesterday not suggestive of SBP Blood cultures negative thus far UA today-protein greater than 300/RBC 0-5/WBC 6-10/leukocytes negative Urine drug screen negative Pro time 20.3/INR 1.7 WBC 8.9/hemoglobin 7.6/hematocrit 21.9/MCV 67/platelets 143 Potassium 3.5/sodium 136/BUN 30/creatinine 2.24 T. bili 17/alk phos 115/AST 179/ALT 16 Urine sodium less than 10  Patient sitting up on the side of the bed-does not feel well, still complaining of joint pains primarily hips knees and ankles fairly constant, Also complaining of dysuria today-pain in his lower pelvic area with urination, states his "whole body feels heavy" He is asking about Lasix  Mentating well   Objective   Vital signs in last 24 hours: Temp:  [98.1 F (36.7 C)-99.4 F (37.4 C)] 99.4 F (37.4 C) (05/25 0724) Pulse Rate:  [67-84] 74 (05/25 0724) Resp:  [16-18] 16 (05/25 0724) BP: (150-172)/(74-92) 153/74 (05/25 0724) SpO2:  [98 %-100 %] 100 % (05/25 0724) Last BM Date : 04/16/24 General:    Older African male in NAD Heart:  Regular rate and rhythm; no murmurs Lungs: Respirations even and unlabored, lungs CTA bilaterally Abdomen: Protuberant, nontense ascites some mild tenderness in the suprapubic area, bowel sounds present Extremities:  Without edema. Neurologic:  Alert and oriented,  grossly normal neurologically.  No asterixis Psych:  Cooperative. Normal mood and affect.  Intake/Output from previous day: 05/24 0701 - 05/25 0700 In: 899 [P.O.:590; IV Piggyback:309] Out: 500 [Urine:500] Intake/Output this shift: Total I/O In: -  Out: 300 [Urine:300]  Lab Results: Recent Labs    04/16/24 2222 04/17/24 1249 04/18/24 0450  WBC 8.5 8.8 8.9  HGB 8.5* 8.9* 7.6*  HCT  25.4* 26.0* 21.9*  PLT 143* 155 143*   BMET Recent Labs    04/16/24 2222 04/17/24 1249 04/18/24 0450  NA 134*  --  136  K 4.2  --  3.5  CL 107  --  111  CO2 16*  --  19*  GLUCOSE 148*  --  113*  BUN 29*  --  30*  CREATININE 2.37* 2.26* 2.24*  CALCIUM  8.1*  --  8.1*   LFT Recent Labs    04/17/24 0405 04/18/24 0450  PROT  --  6.1*  ALBUMIN   --  2.8*  AST  --  179*  ALT  --  16  ALKPHOS  --  115  BILITOT  --  17.0*  BILIDIR 9.9*  --    PT/INR Recent Labs    04/16/24 2222 04/18/24 0450  LABPROT 19.1* 20.3*  INR 1.6* 1.7*    Studies/Results: US  Paracentesis Result Date: 04/18/2024 INDICATION: Hepatitis-B with cirrhosis and recurrent ascites. Request for diagnostic and therapeutic paracentesis. EXAM: ULTRASOUND GUIDED PARACENTESIS MEDICATIONS: 1% lidocaine  10 mL COMPLICATIONS: None immediate. PROCEDURE: Informed written consent was obtained from the patient after a discussion of the risks, benefits and alternatives to treatment. A timeout was performed prior to the initiation of the procedure. Initial ultrasound scanning demonstrates a moderate amount of ascites within the right lateral abdomen. The right lateral abdomen was prepped and draped in the usual sterile fashion. 1% lidocaine  was used for local anesthesia. Following this, a 19 gauge, 7-cm, Yueh catheter was introduced. An ultrasound image was saved for documentation purposes. The paracentesis was performed. The catheter was removed and a dressing  was applied. The patient tolerated the procedure well without immediate post procedural complication. FINDINGS: A total of approximately 3 L of clear gold fluid was removed. Samples were sent to the laboratory as requested by the clinical team. IMPRESSION: Successful ultrasound-guided paracentesis yielding 3 liters of peritoneal fluid. Procedure performed by: Nicky Barrack, PA-C Electronically Signed   By: Erica Hau M.D.   On: 04/18/2024 07:58   CT HEAD WO CONTRAST  ( ) Result Date: 04/16/2024 CLINICAL DATA:  Altered mental status. EXAM: CT HEAD WITHOUT CONTRAST TECHNIQUE: Contiguous axial images were obtained from the base of the skull through the vertex without intravenous contrast. RADIATION DOSE REDUCTION: This exam was performed according to the departmental dose-optimization program which includes automated exposure control, adjustment of the mA and/or kV according to patient size and/or use of iterative reconstruction technique. COMPARISON:  February 16, 2024 FINDINGS: Brain: No evidence of acute infarction, hemorrhage, hydrocephalus, extra-axial collection or mass lesion/mass effect. Vascular: No hyperdense vessel or unexpected calcification. Skull: A posterior parietal craniotomy defect is seen along the vertex. Sinuses/Orbits: No acute finding. Other: A 1.7 cm x 2.8 cm well-defined area of low attenuation (approximately 10.93 Hounsfield units is seen within the scalp soft tissues of the posterior fossa, slightly to the right of midline. An ill-defined, 1.2 cm x 1.4 cm area of low attenuation (approximately 47.65 Hounsfield units) is seen within this region on the prior study. IMPRESSION: 1. No acute intracranial abnormality. 2. Findings which may represent a small scalp soft tissue abscess versus seroma. Correlation with physical examination is recommended. Electronically Signed   By: Virgle Grime M.D.   On: 04/16/2024 23:53   DG Chest Port 1 View Result Date: 04/16/2024 CLINICAL DATA:  AMS EXAM: PORTABLE CHEST 1 VIEW COMPARISON:  None Available. FINDINGS: Prominent cardiac silhouette likely exaggerated by AP portable technique and low lung volumes. The heart and mediastinal contours are grossly unchanged. Low lung volumes. No focal consolidation. No pulmonary edema. No pleural effusion. No pneumothorax. No acute osseous abnormality. IMPRESSION: Low lung volumes with no active disease. Electronically Signed   By: Morgane  Naveau M.D.   On: 04/16/2024 22:45        Assessment / Plan:    #62  48 year old [place] male with chronic hepatitis B, recent diagnosis of decompensated cirrhosis, and just initiated Baraclude treatment about 3 weeks ago via [facility]  To the emergency room after family apparently had a "collapse" at home with shaking, had not been feeling well over the past 24 hours admits to feeling very weak, fatigued, and also complaining of joint pain and lower abdominal discomfort with ambulating.  Neuro-up within the past month during hospitalization here seizure disorder ruled out  M ELD 3.0=34  Suspect this episode may have been related to hepatic encephalopathy, did have significantly elevated venous ammonia on admission.  Has had rapid normalization of his mental status on lactulose  here  Thus far no definite infectious diagnosis, Paracentesis-cell counts not consistent with SBP Cultures negative so far Chest x-ray negative  Patient is complaining of dysuria today we will repeat UA  #2 malaise, fatigue, joint pains-this may be related to [facility]  #3-acute kidney injury Urine sodium less than 10-suspect early HRS  Patient has been receiving albumin  IV every 6, 2 additional doses ordered today Hold hydralazine   Not pursue additional paracentesis for comfort in setting of acute kidney injury  #4 hepatic encephalopathy improved-continue current doses of lactulose , start Xifaxan 550 twice daily  Plan;  2 g sodium diet Avoid paracentesis  in setting of acute kidney injury/early HRS Continue albumin  every 6 hours Hold hydralazine  hold Norvasc  Check lactate today-Baraclude can be associated with lactic acidosis Need to consider starting octreotide and midodrine-will discuss Decision has been made not to get him transferred to [facility], however we do not have continued improvement in the next 24 to 48 hours, and unable to get him discharged then he may need to be transferred to [facility]     Principal  Problem:   Cirrhosis of liver (HCC)     LOS: 1 day   Reka Wist PA-C 04/18/2024, 10:15 AM

## 2024-04-18 NOTE — Consult Note (Signed)
 [facility] Renal Consultation Note  Requesting MD: Pahwani Indication for Consultation: subacute renal failure-  concern for HRS  HPI:  Sean Foster is a 48 y.o. 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  Labs show hgb 8.5, AST 206, Bili of 18 but also BUN and crt of 29 and 2.37 and that is the reason for consult.  Regarding kidney function -  see trend below-  crt 0.93 on 03/16/24.  During hosp for paracentesis presenting crt 1.38-  peaked at 1.91 but then was 1.67 on discharge 03/23/24.  Trend since 5/23 has been slow slight improvement-  last crt 2.24.  urinalysis shows protein >300 and large hgb but no cells-  urine sodium less than 10.  Underwent paracentesis today - removed 3 liters   BP has not been low-  UOP seems adequate.  He is just c/o overall achiness, some DOE and discomfort related to distended belly- he is not really edematous  Creatinine, Ser  Date/Time Value Ref Range Status  04/18/2024 04:50 AM 2.24 (H) 0.61 - 1.24 mg/dL Final  16/08/9603 54:09 PM 2.26 (H) 0.61 - 1.24 mg/dL Final  81/19/1478 29:56 PM 2.37 (H) 0.61 - 1.24 mg/dL Final  21/30/8657 84:69 AM 1.67 (H) 0.61 - 1.24 mg/dL Final  62/95/2841 32:44 AM 1.91 (H) 0.61 - 1.24 mg/dL Final  11/27/7251 66:44 AM 1.56 (H) 0.61 - 1.24 mg/dL Final  03/47/4259 56:38 PM 1.38 (H) 0.61 - 1.24 mg/dL Final    Comment:    ICTERUS AT THIS LEVEL MAY AFFECT RESULT  03/16/2024 05:51 AM 0.93 0.61 - 1.24 mg/dL Final  75/64/3329 51:88 AM 1.16 0.61 - 1.24 mg/dL Final  41/66/0630 16:01 PM  0.96 0.61 - 1.24 mg/dL Final  09/32/3557 32:20 PM 1.27 (H) 0.61 - 1.24 mg/dL Final  25/42/7062 37:62 AM 0.94 0.61 - 1.24 mg/dL Final  83/15/1761 60:73 PM 1.11 0.61 - 1.24 mg/dL Final  71/04/2693 85:46 AM 0.85 0.61 - 1.24 mg/dL Final  27/01/5008 38:18 AM 0.86 0.61 - 1.24 mg/dL Final  29/93/7169 67:89 AM 0.83 0.61 - 1.24 mg/dL Final  38/08/1750 02:58 AM 0.73 0.61 - 1.24 mg/dL Final  52/77/8242 35:36 AM 0.94 0.61 - 1.24 mg/dL Final  14/43/1540 08:67 AM 0.90 0.61 - 1.24 mg/dL Final  61/95/0932 67:12 AM 0.78 0.61 - 1.24 mg/dL Final  45/80/9983 38:25 AM 0.90 0.61 - 1.24 mg/dL Final  05/39/7673 41:93 AM 1.01 0.61 - 1.24 mg/dL Final  79/12/4095 35:32 AM 0.80 0.61 - 1.24 mg/dL Final  99/24/2683 41:96 AM 0.94 0.61 - 1.24 mg/dL Final     PMHx:   Past Medical History:  Diagnosis Date   Diabetes mellitus without complication (HCC)    Hypertension     Past Surgical History:  Procedure Laterality Date   INCISION AND DRAINAGE ABSCESS Left 04/16/2020   Procedure: INCISION AND DRAINAGE BUTTOCK ABSCESS;  Surgeon: Oralee Billow, MD;  Location: [facility];  Service: General;  Laterality: Left;   RETINAL DETACHMENT SURGERY Right 03/08/2024    Family Hx: History reviewed. No pertinent family history.  Social History:  reports that he has  been smoking cigarettes. He has never used smokeless tobacco. He reports that he does not currently use alcohol. He reports that he does not currently use drugs.  Allergies:  Allergies  Allergen Reactions   Chloroquine Itching   Chlorothen Hives, Itching and Other (See Comments)    "Chlorothen (trade name Forensic scientist) is an antihistamine and anticholinergic"    Medications: Prior to Admission medications   Medication Sig Start Date End Date Taking? Authorizing Provider  amLODipine  (NORVASC ) 10 MG tablet Take 10 mg by mouth daily.   Yes [provider]  aspirin EC 81 MG tablet Take 81 mg by mouth daily. Swallow whole.   Yes [provider]   atorvastatin  (LIPITOR) 40 MG tablet Take 1 tablet (40 mg total) by mouth daily. 03/23/24 06/21/24 Yes Vada Garibaldi, MD  doxazosin  (CARDURA ) 2 MG tablet Take 1 tablet (2 mg total) by mouth at bedtime. 03/23/24  Yes Ghimire, Kuber, MD  entecavir (BARACLUDE) 1 MG tablet Take 1 mg by mouth daily. 04/01/24  Yes [provider]  hydrALAZINE  (APRESOLINE ) 25 MG tablet Take 1 tablet (25 mg total) by mouth 3 (three) times daily. 03/23/24 06/21/24 Yes Ghimire, Kuber, MD  prednisoLONE acetate (PRED FORTE) 1 % ophthalmic suspension Place 1 drop into the right eye 2 (two) times daily. 03/16/24  Yes [provider]    I have reviewed the patient's current medications.  Labs:  Results for orders placed or performed during the hospital encounter of 04/16/24 (from the past 48 hours)  CBC with Differential     Status: Abnormal   Collection Time: 04/16/24 10:22 PM  Result Value Ref Range   WBC 8.5 4.0 - 10.5 K/uL   RBC 3.68 (L) 4.22 - 5.81 MIL/uL   Hemoglobin 8.5 (L) 13.0 - 17.0 g/dL    Comment: Reticulocyte Hemoglobin testing may be clinically indicated, consider ordering this additional test [id number]    HCT 25.4 (L) 39.0 - 52.0 %   MCV 69.0 (L) 80.0 - 100.0 fL   MCH 23.1 (L) 26.0 - 34.0 pg   MCHC 33.5 30.0 - 36.0 g/dL   RDW 54.0 (H) 98.1 - 19.1 %   Platelets 143 (L) 150 - 400 K/uL    Comment: REPEATED TO VERIFY   nRBC 0.0 0.0 - 0.2 %   Neutrophils Relative % 64 %   Neutro Abs 5.4 1.7 - 7.7 K/uL   Lymphocytes Relative 29 %   Lymphs Abs 2.5 0.7 - 4.0 K/uL   Monocytes Relative 5 %   Monocytes Absolute 0.4 0.1 - 1.0 K/uL   Eosinophils Relative 2 %   Eosinophils Absolute 0.2 0.0 - 0.5 K/uL   Basophils Relative 0 %   Basophils Absolute 0.0 0.0 - 0.1 K/uL   WBC Morphology See Note     Comment: Morphology unremarkable   Smear Review See Note     Comment: Normal Platelet Morphology   nRBC 1 (H) 0 /100 WBC   Abs Immature Granulocytes 0.00 0.00 - 0.07 K/uL   Polychromasia PRESENT     Target Cells PRESENT     Comment: Performed at [facility], [address] [place], [place] [address]  Comprehensive metabolic panel     Status: Abnormal   Collection Time: 04/16/24 10:22 PM  Result Value Ref Range   Sodium 134 (L) 135 - 145 mmol/L   Potassium 4.2 3.5 - 5.1 mmol/L    Comment: HEMOLYSIS AT THIS LEVEL MAY AFFECT RESULT   Chloride 107 98 - 111 mmol/L  CO2 16 (L) 22 - 32 mmol/L   Glucose, Bld 148 (H) 70 - 99 mg/dL    Comment: Glucose reference range applies only to samples taken after fasting for at least 8 hours.   BUN 29 (H) 6 - 20 mg/dL   Creatinine, Ser 1.61 (H) 0.61 - 1.24 mg/dL   Calcium  8.1 (L) 8.9 - 10.3 mg/dL   Total Protein 6.2 (L) 6.5 - 8.1 g/dL   Albumin  2.6 (L) 3.5 - 5.0 g/dL   AST 096 (H) 15 - 41 U/L    Comment: HEMOLYSIS AT THIS LEVEL MAY AFFECT RESULT   ALT 15 0 - 44 U/L    Comment: HEMOLYSIS AT THIS LEVEL MAY AFFECT RESULT   Alkaline Phosphatase 145 (H) 38 - 126 U/L   Total Bilirubin 18.8 (HH) 0.0 - 1.2 mg/dL    Comment: HEMOLYSIS AT THIS LEVEL MAY AFFECT RESULT CRITICAL RESULT CALLED TO, READ BACK BY AND VERIFIED WITH S. GASQUE. RN AT 2315 05.23.25 JLASIGAN    GFR, Estimated 33 (L) >60 mL/min    Comment: (NOTE) Calculated using the CKD-EPI Creatinine Equation (2021)    Anion gap 11 5 - 15    Comment: Performed at [facility], [address], [place], [place] [address]  Protime-INR     Status: Abnormal   Collection Time: 04/16/24 10:22 PM  Result Value Ref Range   Prothrombin Time 19.1 (H) 11.4 - 15.2 seconds   INR 1.6 (H) 0.8 - 1.2    Comment: (NOTE) INR goal varies based on device and disease states. Performed at [facility], [address], [place], [place] [address]   APTT     Status: Abnormal   Collection Time: 04/16/24 10:22 PM  Result Value Ref Range   aPTT 47 (H) 24 - 36 seconds    Comment:        IF BASELINE aPTT IS ELEVATED, SUGGEST PATIENT RISK ASSESSMENT BE USED TO DETERMINE APPROPRIATE ANTICOAGULANT  THERAPY. Performed at [facility], [address], [place], [place] [address]   Ammonia     Status: Abnormal   Collection Time: 04/16/24 10:22 PM  Result Value Ref Range   Ammonia 89 (H) 9 - 35 umol/L    Comment: HEMOLYSIS AT THIS LEVEL MAY AFFECT RESULT Performed at [facility], [address], [place], [place] [address]   Ethanol     Status: None   Collection Time: 04/16/24 10:22 PM  Result Value Ref Range   Alcohol, Ethyl (B) <15 <15 mg/dL    Comment: (NOTE) For medical purposes only. Performed at [facility], [address], [place], [place] [address]   Bilirubin, direct     Status: Abnormal   Collection Time: 04/17/24  4:05 AM  Result Value Ref Range   Bilirubin, Direct 9.9 (H) 0.0 - 0.2 mg/dL    Comment: Performed at [facility], [address] [place], [place] [address]  Sodium, urine, random     Status: None   Collection Time: 04/17/24  7:45 AM  Result Value Ref Range   Sodium, Ur <10 mmol/L    Comment: Performed at [facility], [address] [place], [place] [address]  Creatinine, urine, random     Status: None   Collection Time: 04/17/24  7:45 AM  Result Value Ref Range   Creatinine, Urine 183 mg/dL    Comment: Performed at [facility], [address], [place], [place] [address]  Body fluid cell count with differential     Status: Abnormal   Collection Time: 04/17/24 11:50 AM  Result Value Ref Range   Fluid Type-FCT PERITONEAL FLUID CYTO     Comment: CORRECTED ON 05/24 AT 1219: PREVIOUSLY REPORTED AS ABDOMEN   Color, Fluid YELLOW (A) YELLOW   Appearance, Fluid HAZY (A) CLEAR   Total Nucleated Cell Count, Fluid 155 0 - 1,000 cu mm   Neutrophil Count, Fluid 1 0 - 25 %   Lymphs, Fluid 50 %   Monocyte-Macrophage-Serous Fluid 49 (L) 50 - 90 %    Comment: Performed at [facility], [address] [place], [place] [address]  Protein, pleural or peritoneal fluid     Status: None   Collection Time: 04/17/24 11:50 AM   Result Value Ref Range   Total protein, fluid <3.0 g/dL    Comment: (NOTE) No normal range established for this test Results should be evaluated in conjunction with serum values    Fluid Type-FTP PERITONEAL FLUID CYTO     Comment: Performed at [facility], [address] [place], [place] [address] CORRECTED ON 05/24 AT 1219: PREVIOUSLY REPORTED AS ABDOMEN   Glucose, pleural or peritoneal fluid     Status: None   Collection Time: 04/17/24 11:50 AM  Result Value Ref Range   Glucose, Fluid 108 mg/dL    Comment: (NOTE) No normal range established for this test Results should be evaluated in conjunction with serum values    Fluid Type-FGLU PERITONEAL FLUID CYTO     Comment: Performed at [facility], [address] [place], [place] [address] CORRECTED ON 05/24 AT 1219: PREVIOUSLY REPORTED AS ABDOMEN   Culture, body fluid w Gram Stain-bottle     Status: None (Preliminary result)   Collection Time: 04/17/24 11:52 AM   Specimen: Peritoneal Washings  Result Value Ref Range   Specimen Description PERITONEAL    Special Requests NONE    Culture      NO GROWTH < 24 HOURS Performed at [facility], [address] [place], [place] [address]    Report Status PENDING   Gram stain     Status: None   Collection Time: 04/17/24 11:52 AM   Specimen: Peritoneal Washings  Result Value Ref Range   Specimen Description PERITONEAL    Special Requests NONE    Gram Stain      WBC PRESENT, PREDOMINANTLY MONONUCLEAR NO ORGANISMS SEEN CYTOSPIN SMEAR Performed at [facility], [address] [place], [place] [address]    Report Status 04/17/2024 FINAL   Glucose, capillary     Status: Abnormal   Collection Time: 04/17/24 12:47 PM  Result Value Ref Range   Glucose-Capillary 105 (H) 70 - 99 mg/dL    Comment: Glucose reference range applies only to samples taken after fasting for at least 8 hours.   Comment 1 Notify RN   Culture, blood (Routine X 2) w Reflex to ID Panel      Status: None (Preliminary result)   Collection Time: 04/17/24 12:49 PM   Specimen: BLOOD LEFT HAND  Result Value Ref Range   Specimen Description BLOOD LEFT HAND    Special Requests      BOTTLES DRAWN AEROBIC AND ANAEROBIC Blood Culture adequate volume   Culture      NO GROWTH < 24 HOURS Performed at [facility], [address] [place], [place] [address]    Report Status PENDING   Culture,  blood (Routine X 2) w Reflex to ID Panel     Status: None (Preliminary result)   Collection Time: 04/17/24 12:49 PM   Specimen: BLOOD RIGHT HAND  Result Value Ref Range   Specimen Description BLOOD RIGHT HAND    Special Requests      BOTTLES DRAWN AEROBIC AND ANAEROBIC Blood Culture adequate volume   Culture      NO GROWTH < 24 HOURS Performed at [facility], [address] [place], [place] [address]    Report Status PENDING   CBC     Status: Abnormal   Collection Time: 04/17/24 12:49 PM  Result Value Ref Range   WBC 8.8 4.0 - 10.5 K/uL   RBC 3.85 (L) 4.22 - 5.81 MIL/uL   Hemoglobin 8.9 (L) 13.0 - 17.0 g/dL    Comment: Reticulocyte Hemoglobin testing may be clinically indicated, consider ordering this additional test [id number]    HCT 26.0 (L) 39.0 - 52.0 %   MCV 67.5 (L) 80.0 - 100.0 fL   MCH 23.1 (L) 26.0 - 34.0 pg   MCHC 34.2 30.0 - 36.0 g/dL   RDW 84.6 (H) 96.2 - 95.2 %   Platelets 155 150 - 400 K/uL    Comment: REPEATED TO VERIFY PLATELET COUNT CONFIRMED BY SMEAR    nRBC 0.0 0.0 - 0.2 %    Comment: Performed at [facility], [address], [place], [place] [address]  Creatinine, serum     Status: Abnormal   Collection Time: 04/17/24 12:49 PM  Result Value Ref Range   Creatinine, Ser 2.26 (H) 0.61 - 1.24 mg/dL   GFR, Estimated 35 (L) >60 mL/min    Comment: (NOTE) Calculated using the CKD-EPI Creatinine Equation (2021) Performed at [facility], [address], [place], [place] [address]   Hemoglobin A1c     Status: Abnormal   Collection  Time: 04/17/24 12:49 PM  Result Value Ref Range   Hgb A1c MFr Bld 4.7 (L) 4.8 - 5.6 %    Comment: (NOTE) Pre diabetes:          5.7%-6.4%  Diabetes:              >6.4%  Glycemic control for   <7.0% adults with diabetes    Mean Plasma Glucose 88.19 mg/dL    Comment: Performed at [facility], [address] [place], [place] [address]  Iron and TIBC     Status: Abnormal   Collection Time: 04/17/24 12:49 PM  Result Value Ref Range   Iron 117 45 - 182 ug/dL   TIBC 664 (L) 403 - 474 ug/dL   Saturation Ratios 81 (H) 17.9 - 39.5 %   UIBC 27 ug/dL    Comment: Performed at [facility], [address] [place], [place] [address]  Glucose, capillary     Status: Abnormal   Collection Time: 04/17/24  4:36 PM  Result Value Ref Range   Glucose-Capillary 152 (H) 70 - 99 mg/dL    Comment: Glucose reference range applies only to samples taken after fasting for at least 8 hours.   Comment 1 Notify RN   Glucose, capillary     Status: Abnormal   Collection Time: 04/17/24  8:22 PM  Result Value Ref Range   Glucose-Capillary 158 (H) 70 - 99 mg/dL    Comment: Glucose reference range applies only to samples taken after fasting for at least 8 hours.  Comprehensive metabolic panel  Status: Abnormal   Collection Time: 04/18/24  4:50 AM  Result Value Ref Range   Sodium 136 135 - 145 mmol/L   Potassium 3.5 3.5 - 5.1 mmol/L   Chloride 111 98 - 111 mmol/L   CO2 19 (L) 22 - 32 mmol/L   Glucose, Bld 113 (H) 70 - 99 mg/dL    Comment: Glucose reference range applies only to samples taken after fasting for at least 8 hours.   BUN 30 (H) 6 - 20 mg/dL   Creatinine, Ser 1.61 (H) 0.61 - 1.24 mg/dL   Calcium  8.1 (L) 8.9 - 10.3 mg/dL   Total Protein 6.1 (L) 6.5 - 8.1 g/dL   Albumin  2.8 (L) 3.5 - 5.0 g/dL   AST 096 (H) 15 - 41 U/L   ALT 16 0 - 44 U/L   Alkaline Phosphatase 115 38 - 126 U/L   Total Bilirubin 17.0 (H) 0.0 - 1.2 mg/dL   GFR, Estimated 35 (L) >60 mL/min    Comment:  (NOTE) Calculated using the CKD-EPI Creatinine Equation (2021)    Anion gap 6 5 - 15    Comment: Performed at [facility], [address] [place], [place] [address]  CBC with Differential/Platelet     Status: Abnormal   Collection Time: 04/18/24  4:50 AM  Result Value Ref Range   WBC 8.9 4.0 - 10.5 K/uL   RBC 3.24 (L) 4.22 - 5.81 MIL/uL   Hemoglobin 7.6 (L) 13.0 - 17.0 g/dL    Comment: Reticulocyte Hemoglobin testing may be clinically indicated, consider ordering this additional test [id number]    HCT 21.9 (L) 39.0 - 52.0 %   MCV 67.6 (L) 80.0 - 100.0 fL   MCH 23.5 (L) 26.0 - 34.0 pg   MCHC 34.7 30.0 - 36.0 g/dL   RDW 82.9 (H) 56.2 - 13.0 %   Platelets 143 (L) 150 - 400 K/uL    Comment: REPEATED TO VERIFY   nRBC 0.2 0.0 - 0.2 %   Neutrophils Relative % 50 %   Neutro Abs 4.5 1.7 - 7.7 K/uL   Lymphocytes Relative 31 %   Lymphs Abs 2.7 0.7 - 4.0 K/uL   Monocytes Relative 17 %   Monocytes Absolute 1.5 (H) 0.1 - 1.0 K/uL   Eosinophils Relative 2 %   Eosinophils Absolute 0.1 0.0 - 0.5 K/uL   Basophils Relative 0 %   Basophils Absolute 0.0 0.0 - 0.1 K/uL   Immature Granulocytes 0 %   Abs Immature Granulocytes 0.02 0.00 - 0.07 K/uL    Comment: Performed at [facility], [address] [place], [place] [address]  Protime-INR     Status: Abnormal   Collection Time: 04/18/24  4:50 AM  Result Value Ref Range   Prothrombin Time 20.3 (H) 11.4 - 15.2 seconds   INR 1.7 (H) 0.8 - 1.2    Comment: (NOTE) INR goal varies based on device and disease states. Performed at [facility], [address] [place], [place] [address]   APTT     Status: Abnormal   Collection Time: 04/18/24  4:50 AM  Result Value Ref Range   aPTT 55 (H) 24 - 36 seconds    Comment:        IF BASELINE aPTT IS ELEVATED, SUGGEST PATIENT RISK ASSESSMENT BE USED TO DETERMINE APPROPRIATE ANTICOAGULANT THERAPY. Performed at [facility], [address], [place], [place] [address]    Magnesium     Status: None  Collection Time: 04/18/24  4:50 AM  Result Value Ref Range   Magnesium 2.3 1.7 - 2.4 mg/dL    Comment: Performed at [facility], [address], [place], [place] [address]  Rapid urine drug screen (hospital performed)     Status: None   Collection Time: 04/18/24  6:17 AM  Result Value Ref Range   Opiates NONE DETECTED NONE DETECTED   Cocaine NONE DETECTED NONE DETECTED   Benzodiazepines NONE DETECTED NONE DETECTED   Amphetamines NONE DETECTED NONE DETECTED   Tetrahydrocannabinol NONE DETECTED NONE DETECTED   Barbiturates NONE DETECTED NONE DETECTED    Comment: (NOTE) DRUG SCREEN FOR MEDICAL PURPOSES ONLY.  IF CONFIRMATION IS NEEDED FOR ANY PURPOSE, NOTIFY LAB WITHIN 5 DAYS.  LOWEST DETECTABLE LIMITS FOR URINE DRUG SCREEN Drug Class                     Cutoff (ng/mL) Amphetamine and metabolites    1000 Barbiturate and metabolites    200 Benzodiazepine                 200 Opiates and metabolites        300 Cocaine and metabolites        300 THC                            50 Performed at [facility], [address] [place], [place] [address]   Urinalysis, w/ Reflex to Culture (Infection Suspected) -Urine, Clean Catch     Status: Abnormal   Collection Time: 04/18/24  6:17 AM  Result Value Ref Range   Specimen Source URINE, CLEAN CATCH    Color, Urine AMBER (A) YELLOW    Comment: BIOCHEMICALS MAY BE AFFECTED BY COLOR   APPearance CLOUDY (A) CLEAR   Specific Gravity, Urine 1.015 1.005 - 1.030   pH 5.0 5.0 - 8.0   Glucose, UA NEGATIVE NEGATIVE mg/dL   Hgb urine dipstick LARGE (A) NEGATIVE   Bilirubin Urine SMALL (A) NEGATIVE   Ketones, ur NEGATIVE NEGATIVE mg/dL   Protein, ur >=010 (A) NEGATIVE mg/dL   Nitrite NEGATIVE NEGATIVE   Leukocytes,Ua NEGATIVE NEGATIVE   RBC / HPF 0-5 0 - 5 RBC/hpf   WBC, UA 6-10 0 - 5 WBC/hpf    Comment:        Reflex urine culture not performed if WBC <=10, OR if Squamous epithelial cells >5. If  Squamous epithelial cells >5 suggest recollection.    Bacteria, UA NONE SEEN NONE SEEN   Squamous Epithelial / HPF 0-5 0 - 5 /HPF   Mucus PRESENT    Hyaline Casts, UA PRESENT     Comment: Performed at [facility] Lab, [address] [place], [place] [address]  Glucose, capillary     Status: Abnormal   Collection Time: 04/18/24  8:02 AM  Result Value Ref Range   Glucose-Capillary 108 (H) 70 - 99 mg/dL    Comment: Glucose reference range applies only to samples taken after fasting for at least 8 hours.   Comment 1 Notify RN   Glucose, capillary     Status: Abnormal   Collection Time: 04/18/24 11:47 AM  Result Value Ref Range   Glucose-Capillary 112 (H) 70 - 99 mg/dL    Comment: Glucose reference range applies only to samples taken after fasting for at least 8 hours.   Comment 1 Notify RN   Ammonia  Status: Abnormal   Collection Time: 04/18/24 12:08 PM  Result Value Ref Range   Ammonia 60 (H) 9 - 35 umol/L    Comment: Performed at [facility], [address] [place], [place] [address]  Lactic acid, plasma     Status: None   Collection Time: 04/18/24 12:43 PM  Result Value Ref Range   Lactic Acid, Venous 1.9 0.5 - 1.9 mmol/L    Comment: Performed at [facility], [address] [place], [place] [address]     ROS:  A comprehensive review of systems was negative except for: Constitutional: positive for fatigue and generalized aches Gastrointestinal: positive for abdominal pain and distention  Physical Exam: Vitals:   04/18/24 0724 04/18/24 1433  BP: (!) 153/74 (!) 166/92  Pulse: 74 79  Resp: 16 16  Temp: 99.4 F (37.4 C) (!) 97.5 F (36.4 C)  SpO2: 100% 100%     General: well appearing friendly african male-  NAD HEENT: PERRLA, EOMI, mucous membranes moist  Neck: no JVD Heart:RRR Lungs:mostly clear Abdomen:protuberent, slightly tender Extremities: no edema Skin:warm and dry Neuro:alert, possibly just a little slow mental processing    Assessment/Plan: 48 year old BM with DM, HTN, HFrEF and cirrhosis presenting with subacute change in creatinine 1.Renal- crt going from normal to 2.2 over about a month.  Many hospitalizations mostly related to this cirrhosis diagnosis, 2 paracenteses.  No obvious nephrotoxins.  Urine with proteinuria -  will quantify.  The differential would be bilirubin  related kidney injury vs hepatorenal.  The urine sodium of less than 10 is more indicative of hepatorenal.  Patient has already been started on albumin .  His blood pressure if anything is high so dont think he needs midodrine or levophed.  The other medication in the cocktail is octreotide so I will add it on. No indications for dialysis at present-  we will follow closely with you  2. Cirrhosis-  MELD is calculated at 32 which estimates a 3 month mortality at 52% 3. Hypertension/volume  - interestingly does not appear to be overloaded or edematous even though albumin  is low-  does not need lasix   4. Anemia  - is not helping his oncotic pressure -  iron is OK -  will give a dose of ESA but may soon need transfusion with hgb of 7.6 ttoday    Connie Lasater A Etai Copado 04/18/2024, 3:00 PM

## 2024-04-18 NOTE — Plan of Care (Signed)

## 2024-04-18 NOTE — Plan of Care (Signed)

## 2024-04-18 NOTE — Progress Notes (Signed)
 error

## 2024-04-18 NOTE — Discharge Summary (Addendum)
 Physician Discharge Summary   Patient: Sean Foster MRN: [id number] DOB: 1976/02/14  Admit date:     04/16/2024  Discharge date: 04/19/24  Discharge Physician: Ayelet Gruenewald, MD   PCP: [name], [place]   Discharge facility: [facility] health for higher level of care for  hepatology evaluation Accepting physician: Dr. Dortha Gauss  Discharge Diagnoses: Principal Problem:   Cirrhosis of liver [facility]) Active Problems:   Chronic viral hepatitis B without delta agent and without coma (HCC)   Hepatic encephalopathy (HCC)   AKI (acute kidney injury) [facility])  Hospital Course: HPI: Sean Foster is a 48 y.o. 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   Apparently he collapsed at home per family and had an episode of shaking.  Patient reports since yesterday he was not feeling well, have generalized body pain, joint pain, no energy and noticed abdominal distention.  He had not had bowel movement this morning and reports some discomfort in his abdomen.  He feels heavy overall.  He denies fever, chills, nausea, vomiting, chest pain or shortness of breath.   Of note: Patient was admitted on 03/20/2024 with subacute liver failure decompensated with ascites.  During that hospitalization seizures were ruled out.  He sees hepatologist at [facility].  He was started on Baraclude about 3 weeks ago.   He reports tobacco abuse however denies any alcohol or any illicit drug use.    ED Course: Upon arrival to ED: Afebrile, pulse 64, RR: 14, BP 160/83, maintaining oxygen saturation on room air.  CBC shows WBC of 8.5, H&H 8.5, PLT 143, NA: 134, K: 4.2, BUN 29, creatinine 2.37, AST 206, ALT 15, ALP 145, total bilirubin 18.8, GFR 33,  INR 1.2, ammonia 89, direct bilirubin 9.9.  CT head questionable small scalp soft tissue abscess versus seroma.  Chest x-ray negative, UA pending.   Initially plan was to transfer patient to transfer patient to tertiary care hospital at [facility] health however unfortunately no beds were available at that time.  GI consulted-was also recommended transfer to tertiary care.  Patient will be admitted here and will be transferred to [facility] once bed available  Given patient's renal function has not improving much concern for development of hepatorenal syndrome in the setting of decompensated hepatic cirrhosis reached out to [facility] health and our gastroenterology service recommended to transfer to assess by GI and hepatology service Spoke with on-call hospitalist Dr. Dortha Gauss who graciously has been accepted patient at [facility] health.   Assessment and Plan: Acute hepatic encephalopathy: Ammonia elevated upon arrival.  CT head negative, patient's mentation has improved, he is now fully alert and oriented and ammonia improved from 89 yesterday to 60 today.  Continue lactulose .   Decompensated cirrhosis Chronic hepatitis B Ascites: Elevated liver enzymes/hyperbilirubinemia - Status post paracentesis of 3 L of peritoneal fluid, no signs of SBP.  Received 4 doses of albumin   He still appears to have significant ascites fluid however due to elevated creatinine, GI recommends against another paracentesis.  Will defer to them. Patient's mental status has been improved however in the setting of decompensated cirrhosis and not improvement of renal function  our inpatient GI recommended to transfer to [facility] health for further evaluation by GI and  hepatology. Currently patient is on albumin  25 g every 6 hour, lactulose  20 g twice daily, octreotide 100 mcg every 12 hours.   AKI on CKD stage IIIa: During recent hospitalization, at the time of discharge, his  creatinine was 1.67.  Came in with 2.37 which is improved to 2.24.  Will defer to GI. Further workup revealed hepatorenal syndrome versus bilirubin associated hepatorenal injury.  Nephrology recommended patient might need midodrine/Levophed however at this time recommended to start octreotide.  And no need for dialysis yet.  Type 2 diabetes: Hemoglobin A1c only 4.7 while it was 131. 4 years ago.  Blood sugar control.  Will stop SSI.   Hypertension: - Resume home medications amlodipine  and hydralazine .  Hold on doxazosin .  Monitor blood pressure closely and resume doxazosin  if blood pressure remains elevated.   Hyperlipidemia: Hold on statin at this time due to worsening liver enzymes    Microcytic anemia: Probably due to poor nutrition/iron deficiency.  Baseline hemoglobin ranges around 10 but upon admission this time it was 8.5 which has dropped to 7.6.  Will check every 12 hours and transfuse if less than 7. -Patient has received darbepoetin alfa 200 mcg once.   Thrombocytopenia likely in the setting of liver cirrhosis. - Continue to monitor   tobacco abuse: I have discussed tobacco cessation with the patient.  I have counseled the patient regarding the negative impacts of continued tobacco use including but not limited to lung cancer, COPD, and cardiovascular disease.  I have discussed alternatives to tobacco and modalities that may help facilitate tobacco cessation including but not limited to biofeedback, hypnosis, and medications.  Total time spent with tobacco counseling was 5 minutes.    Generalized body ache/arthralgia: Due to anemia, avoiding NSAIDs, due to liver cirrhosis, avoiding acetaminophen , ordered oxycodone  as needed.   DVT prophylaxis: heparin injection 5,000 Units Start: 04/17/24 1400 SCDs Start: 04/17/24 1040   Code Status: Full Code  Family Communication:  None present at bedside.  Plan of care discussed with patient in length and he/she verbalized understanding and  agreed with it.   Status is: Inpatient Remains inpatient appropriate because: Dropping hemoglobin, still with ascites and elevated creatinine.     Estimated body mass index is 32.27 kg/m as calculated from the following:   Height as of this encounter: 5\' 10"  (1.778 m).   Weight as of this encounter: 102 kg.   Nutritional Assessment: Body mass index is 32.27 kg/m.Aaron Aas Seen by dietician.  I agree with the assessment and plan as outlined below: Nutrition Status:   Pain control - [place]  Controlled Substance Reporting System database was reviewed. and patient was instructed, not to drive, operate heavy machinery, perform activities at heights, swimming or participation in water  activities or provide baby-sitting services while on Pain, Sleep and Anxiety Medications; until their outpatient Physician has advised to do so again. Also recommended to not to take more than prescribed Pain, Sleep and Anxiety Medications.  Consultants: Gastroenterology Procedures performed: Ultrasound paracentesis Disposition: [facility] health-for hepatology evaluation Diet recommendation:  2 g sodium restricted diet. DISCHARGE MEDICATION: Allergies as of 04/19/2024       Reactions   Chloroquine Itching   Chlorothen Hives, Itching, Other (See Comments)   "Chlorothen (trade name Forensic scientist) is an antihistamine and anticholinergic"        Medication List     TAKE these medications    amLODipine  10 MG tablet Commonly known as: NORVASC  Take 10 mg by mouth daily.   aspirin EC 81  MG tablet Take 81 mg by mouth daily. Swallow whole.   atorvastatin  40 MG tablet Commonly known as: LIPITOR Take 1 tablet (40 mg total) by mouth daily.   doxazosin  2 MG tablet Commonly known as: CARDURA  Take 1 tablet (2 mg total) by mouth at bedtime.   entecavir 1 MG tablet Commonly known as: BARACLUDE Take 1 mg by mouth daily.   hydrALAZINE  25 MG tablet Commonly known as: APRESOLINE  Take 1 tablet (25 mg  total) by mouth 3 (three) times daily.   prednisoLONE acetate 1 % ophthalmic suspension Commonly known as: PRED FORTE Place 1 drop into the right eye 2 (two) times daily.      Review of Systems  Constitutional:  Positive for malaise/fatigue. Negative for chills, fever and weight loss.  Respiratory:  Negative for cough, sputum production, shortness of breath and wheezing.   Cardiovascular:  Negative for chest pain, palpitations, leg swelling and PND.  Gastrointestinal:  Negative for abdominal pain, blood in stool, constipation, diarrhea, heartburn, melena, nausea and vomiting.  Genitourinary:  Negative for dysuria, flank pain, frequency, hematuria and urgency.  Musculoskeletal:  Negative for back pain, joint pain, myalgias and neck pain.  Skin:  Negative for itching and rash.  Neurological:  Negative for dizziness and headaches.  Psychiatric/Behavioral:  The patient is not nervous/anxious.      Discharge Exam: General exam: Appears calm and comfortable, cachectic Respiratory system: Clear to auscultation. Respiratory effort normal. Cardiovascular system: S1 & S2 heard, RRR. No JVD, murmurs, rubs, gallops or clicks. No pedal edema. Gastrointestinal system: Abdomen is soft, distended with positive fluid shift, nontender. No organomegaly or masses felt. Normal bowel sounds heard. Central nervous system: Alert and oriented. No focal neurological deficits. Extremities: Symmetric 5 x 5 power. Skin: No rashes, lesions or ulcers Psychiatry: Judgement and insight appear normal. Mood & affect appropriate.   Filed Weights   04/16/24 2218  Weight: 102 kg    Condition at discharge: stable  The results of significant diagnostics from this hospitalization (including imaging, microbiology, ancillary and laboratory) are listed below for reference.   Imaging Studies: US  Paracentesis Result Date: 04/18/2024 INDICATION: Hepatitis-B with cirrhosis and recurrent ascites. Request for diagnostic and  therapeutic paracentesis. EXAM: ULTRASOUND GUIDED PARACENTESIS MEDICATIONS: 1% lidocaine  10 mL COMPLICATIONS: None immediate. PROCEDURE: Informed written consent was obtained from the patient after a discussion of the risks, benefits and alternatives to treatment. A timeout was performed prior to the initiation of the procedure. Initial ultrasound scanning demonstrates a moderate amount of ascites within the right lateral abdomen. The right lateral abdomen was prepped and draped in the usual sterile fashion. 1% lidocaine  was used for local anesthesia. Following this, a 19 gauge, 7-cm, Yueh catheter was introduced. An ultrasound image was saved for documentation purposes. The paracentesis was performed. The catheter was removed and a dressing was applied. The patient tolerated the procedure well without immediate post procedural complication. FINDINGS: A total of approximately 3 L of clear gold fluid was removed. Samples were sent to the laboratory as requested by the clinical team. IMPRESSION: Successful ultrasound-guided paracentesis yielding 3 liters of peritoneal fluid. Procedure performed by: Nicky Barrack, PA-C Electronically Signed   By: Erica Hau M.D.   On: 04/18/2024 07:58   CT HEAD WO CONTRAST ( ) Result Date: 04/16/2024 CLINICAL DATA:  Altered mental status. EXAM: CT HEAD WITHOUT CONTRAST TECHNIQUE: Contiguous axial images were obtained from the base of the skull through the vertex without intravenous contrast. RADIATION DOSE REDUCTION: This exam was performed according to  the departmental dose-optimization program which includes automated exposure control, adjustment of the mA and/or kV according to patient size and/or use of iterative reconstruction technique. COMPARISON:  February 16, 2024 FINDINGS: Brain: No evidence of acute infarction, hemorrhage, hydrocephalus, extra-axial collection or mass lesion/mass effect. Vascular: No hyperdense vessel or unexpected calcification. Skull: A posterior  parietal craniotomy defect is seen along the vertex. Sinuses/Orbits: No acute finding. Other: A 1.7 cm x 2.8 cm well-defined area of low attenuation (approximately 10.93 Hounsfield units is seen within the scalp soft tissues of the posterior fossa, slightly to the right of midline. An ill-defined, 1.2 cm x 1.4 cm area of low attenuation (approximately 47.65 Hounsfield units) is seen within this region on the prior study. IMPRESSION: 1. No acute intracranial abnormality. 2. Findings which may represent a small scalp soft tissue abscess versus seroma. Correlation with physical examination is recommended. Electronically Signed   By: Virgle Grime M.D.   On: 04/16/2024 23:53   DG Chest Port 1 View Result Date: 04/16/2024 CLINICAL DATA:  AMS EXAM: PORTABLE CHEST 1 VIEW COMPARISON:  None Available. FINDINGS: Prominent cardiac silhouette likely exaggerated by AP portable technique and low lung volumes. The heart and mediastinal contours are grossly unchanged. Low lung volumes. No focal consolidation. No pulmonary edema. No pleural effusion. No pneumothorax. No acute osseous abnormality. IMPRESSION: Low lung volumes with no active disease. Electronically Signed   By: Morgane  Naveau M.D.   On: 04/16/2024 22:45   US  Paracentesis Result Date: 03/21/2024 INDICATION: [id number] Ascites [id number] 48 year old male with new diagnosis of hepatic cirrhosis, with ascites and jaundice. IR was requested for diagnostic and therapeutic paracentesis. EXAM: ULTRASOUND GUIDED DIAGNOSTIC AND THERAPEUTIC PARACENTESIS MEDICATIONS: 7 cc of 1% lidocaine  COMPLICATIONS: None immediate. PROCEDURE: Informed written consent was obtained from the patient after a discussion of the risks, benefits and alternatives to treatment. A timeout was performed prior to the initiation of the procedure. Initial ultrasound scanning demonstrates a small amount of ascites within the right lower abdominal quadrant. The right lower abdomen was prepped and draped in  the usual sterile fashion. 1% lidocaine  was used for local anesthesia. Following this, a 6 Fr Safe-T-Centesis catheter was introduced. An ultrasound image was saved for documentation purposes. The paracentesis was performed. The catheter was removed and a dressing was applied. The patient tolerated the procedure well without immediate post procedural complication. FINDINGS: A total of approximately 600 mL of clear, golden yellow peritoneal fluid was removed. Samples were sent to the laboratory as requested by the clinical team. IMPRESSION: Successful ultrasound-guided diagnostic and therapeutic paracentesis yielding 600 mL of peritoneal fluid. Procedure performed by Lambert Pillion, PA-C PLAN: If the patient eventually requires >/=2 paracenteses in a 30 day period, candidacy for formal evaluation by the [facility] Inter[facility]iology [facility] Hypert[facility] will be assessed. Art Largo, MD [facility] Electronically Signed   By: Art Largo M.D.   On: 03/21/2024 11:42   EEG adult Result Date: 03/20/2024 Augustin Leber, MD     03/20/2024  5:47 PM History: 48 yo M with shaking concecrning for seizure Sedation: none Patient State: Awake and drowsy Technique: This EEG was acquired with electrodes placed according to the International 10-20 electrode system (including Fp1, Fp2, F3, F4, C3, C4, P3, P4, O1, O2, T3, T4, T5, T6, A1, A2, Fz, Cz, Pz). The following electrodes were missing or displaced: none. Background: The background consists of intermixed alpha and beta activities. There is a well defined posterior dominant rhythm of 9  Hz that attenuates with eye opening. There is mild anterior shifting of PDR with drowsiness. Photic stimulation: Physiologic driving is present EEG Abnormalities: none Clinical Interpretation: This normal EEG is recorded in the waking and drowsy state. There was no seizure or seizure predisposition recorded on this  study. Please note that lack of epileptiform activity on EEG does not preclude the possibility of epilepsy. Ann Keto, MD [facility] If 7pm- 7am, please page neurology on call as listed in AMION.  DG Chest 2 View Result Date: 03/20/2024 CLINICAL DATA:  Shortness of breath.  Seizure. EXAM: CHEST - 2 VIEW COMPARISON:  Chest radiograph July 18, 2022 FINDINGS: Stable cardiomegaly. Mild elevation right hemidiaphragm. No large area pulmonary consolidation. No pleural effusion or pneumothorax. Osseous structures unremarkable. IMPRESSION: Cardiomegaly. No acute cardiopulmonary process. Electronically Signed   By: Jone Neither M.D.   On: 03/20/2024 17:29   MR Brain W and Wo Contrast Result Date: 03/20/2024 CLINICAL DATA:  Seizure. EXAM: MRI HEAD WITHOUT AND WITH CONTRAST TECHNIQUE: Multiplanar, multiecho pulse sequences of the brain and surrounding structures were obtained without and with intravenous contrast. CONTRAST:  10mL GADAVIST  GADOBUTROL  1 MMOL/ML IV SOLN COMPARISON:  MR head without contrast 02/17/2024 FINDINGS: Brain: No acute infarct, hemorrhage, or mass lesion is present. Periventricular and subcortical T2 hyperintensities bilaterally are moderately advanced for age. The ventricles are of normal size. Deep brain nuclei are within normal limits. The brainstem and cerebellum are within normal limits. The internal auditory canals are within normal limits. Midline structures are within normal limits. Postcontrast images demonstrate no pathologic enhancement. Vascular: Flow is present in the major intracranial arteries. Skull and upper cervical spine: The craniocervical junction is normal. Upper cervical spine is within normal limits. Marrow signal is unremarkable. Sinuses/Orbits: Polyps or mucous retention cysts are again noted in the right maxillary sinus. The paranasal sinuses and mastoid air cells are otherwise clear. The globes and orbits are within normal limits. IMPRESSION: 1. No  acute intracranial abnormality or significant interval change. 2. Periventricular and subcortical T2 hyperintensities bilaterally are moderately advanced for age. The finding is nonspecific but can be seen in the setting of chronic microvascular ischemia, a demyelinating process such as multiple sclerosis, vasculitis, complicated migraine headaches, or as the sequelae of a prior infectious or inflammatory process. Electronically Signed   By: Audree Leas M.D.   On: 03/20/2024 17:18    Microbiology: Results for orders placed or performed during the hospital encounter of 04/16/24  Culture, body fluid w Gram Stain-bottle     Status: None (Preliminary result)   Collection Time: 04/17/24 11:52 AM   Specimen: Peritoneal Washings  Result Value Ref Range Status   Specimen Description PERITONEAL  Final   Special Requests NONE  Final   Culture   Final    NO GROWTH < 24 HOURS Performed at [facility], [address] [place], [place] [address]    Report Status PENDING  Incomplete  Gram stain     Status: None   Collection Time: 04/17/24 11:52 AM   Specimen: Peritoneal Washings  Result Value Ref Range Status   Specimen Description PERITONEAL  Final   Special Requests NONE  Final   Gram Stain   Final    WBC PRESENT, PREDOMINANTLY MONONUCLEAR NO ORGANISMS SEEN CYTOSPIN SMEAR Performed at [facility], [address], [place], [place] [address]    Report Status 04/17/2024 FINAL  Final  Culture, blood (Routine X 2) w Reflex to ID Panel  Status: None (Preliminary result)   Collection Time: 04/17/24 12:49 PM   Specimen: BLOOD LEFT HAND  Result Value Ref Range Status   Specimen Description BLOOD LEFT HAND  Final   Special Requests   Final    BOTTLES DRAWN AEROBIC AND ANAEROBIC Blood Culture adequate volume   Culture   Final    NO GROWTH < 24 HOURS Performed at [facility], [address], [place], [place] [address]    Report Status PENDING  Incomplete  Culture,  blood (Routine X 2) w Reflex to ID Panel     Status: None (Preliminary result)   Collection Time: 04/17/24 12:49 PM   Specimen: BLOOD RIGHT HAND  Result Value Ref Range Status   Specimen Description BLOOD RIGHT HAND  Final   Special Requests   Final    BOTTLES DRAWN AEROBIC AND ANAEROBIC Blood Culture adequate volume   Culture   Final    NO GROWTH < 24 HOURS Performed at [facility], [address] [place], [place] [address]    Report Status PENDING  Incomplete    Labs: CBC: Recent Labs  Lab 04/16/24 2222 04/17/24 1249 04/18/24 0450 04/18/24 1635  WBC 8.5 8.8 8.9 9.5  NEUTROABS 5.4  --  4.5 6.0  HGB 8.5* 8.9* 7.6* 8.4*  HCT 25.4* 26.0* 21.9* 24.9*  MCV 69.0* 67.5* 67.6* 69.6*  PLT 143* 155 143* 141*   Basic Metabolic Panel: Recent Labs  Lab 04/16/24 2222 04/17/24 1249 04/18/24 0450  NA 134*  --  136  K 4.2  --  3.5  CL 107  --  111  CO2 16*  --  19*  GLUCOSE 148*  --  113*  BUN 29*  --  30*  CREATININE 2.37* 2.26* 2.24*  CALCIUM  8.1*  --  8.1*  MG  --   --  2.3   Liver Function Tests: Recent Labs  Lab 04/16/24 2222 04/18/24 0450  AST 206* 179*  ALT 15 16  ALKPHOS 145* 115  BILITOT 18.8* 17.0*  PROT 6.2* 6.1*  ALBUMIN  2.6* 2.8*   CBG: Recent Labs  Lab 04/17/24 2022 04/18/24 0802 04/18/24 1147 04/18/24 1638 04/18/24 2059  GLUCAP 158* 108* 112* 155* 182*    Discharge time spent: more than 30 minutes.  Signed: Bridgett Hattabaugh, MD [facility] 04/19/2024

## 2024-04-18 NOTE — Progress Notes (Signed)
 PROGRESS NOTE    Sean Foster  KYH:[id number] DOB: 01/24/1976 DOA: 04/16/2024 PCP: [name], PA-C   Brief Narrative:  HPI: Sean Foster is a 48 y.o. 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   Apparently he collapsed at home per family and had an episode of shaking.  Patient reports since yesterday he was not feeling well, have generalized body pain, joint pain, no energy and noticed abdominal distention.  He had not had bowel movement this morning and reports some discomfort in his abdomen.  He feels heavy overall.  He denies fever, chills, nausea, vomiting, chest pain or shortness of breath.   Of note: Patient was admitted on 03/20/2024 with subacute liver failure decompensated with ascites.  During that hospitalization seizures were ruled out.  He sees hepatologist at [facility].  He was started on Baraclude about 3 weeks ago.   He reports tobacco abuse however denies any alcohol or any illicit drug use.   ED Course: Upon arrival to ED: Afebrile, pulse 64, RR: 14, BP 160/83, maintaining oxygen saturation on room air.  CBC shows WBC of 8.5, H&H 8.5, PLT 143, NA: 134, K: 4.2, BUN 29, creatinine 2.37, AST 206, ALT 15, ALP 145, total bilirubin 18.8, GFR 33, INR 1.2, ammonia 89, direct bilirubin 9.9.  CT head questionable small scalp soft tissue abscess versus seroma.  Chest x-ray negative, UA pending.   Initially plan was to transfer patient to transfer patient to tertiary care hospital at [facility] however unfortunately no beds were available at that time.  GI consulted-was also recommended transfer to tertiary care.  Patient will be admitted here and will be transferred to [facility] once bed  available.  Assessment & Plan:   Principal Problem:   Cirrhosis of liver (HCC)  Acute hepatic encephalopathy: Ammonia elevated upon arrival.  CT head negative, patient's mentation has improved, he is now fully alert and oriented and ammonia improved from 89 yesterday to 60 today.  Continue lactulose .   Decompensated cirrhosis Chronic hepatitis B Ascites: Elevated liver enzymes/hyperbilirubinemia - Status post paracentesis of 3 L of peritoneal fluid, no signs of SBP.  Received 4 doses of albumin  yesterday, I have ordered 2 more doses.  He still appears to have significant ascites fluid however due to elevated creatinine, GI recommends against another paracentesis.  Will defer to them.   AKI on CKD stage IIIa: During recent hospitalization, at the time of discharge, his creatinine was 1.67.  Came in with 2.37 which is improved to 2.24.  Will defer to GI.   Type 2 diabetes: Hemoglobin A1c only 4.7 while it was 131. 4 years ago.  Blood sugar control.  Will stop SSI.   Hypertension: - Resume home medications amlodipine  and hydralazine .  Hold on doxazosin .  Monitor blood pressure closely and resume doxazosin  if blood pressure remains elevated.   Hyperlipidemia: Hold on statin at this time due to worsening liver enzymes    Microcytic anemia: Probably due to poor nutrition/iron deficiency.  Baseline hemoglobin ranges around 10 but upon admission this time it was 8.5 which has dropped to 7.6.  Will check every 12 hours and transfuse if less than 7.   Thrombocytopenia likely in the setting of liver cirrhosis. - Continue to monitor   tobacco abuse:  I have discussed tobacco cessation with the patient.  I have counseled the patient regarding the negative impacts of continued tobacco use including but not limited to lung cancer, COPD, and cardiovascular disease.  I have discussed alternatives to tobacco and modalities that may help facilitate tobacco cessation including but not limited to  biofeedback, hypnosis, and medications.  Total time spent with tobacco counseling was 5 minutes.   Generalized body ache/arthralgia: Due to anemia, avoiding NSAIDs, due to liver cirrhosis, avoiding acetaminophen , ordered oxycodone  as needed.  DVT prophylaxis: heparin injection 5,000 Units Start: 04/17/24 1400 SCDs Start: 04/17/24 1040   Code Status: Full Code  Family Communication:  None present at bedside.  Plan of care discussed with patient in length and he/she verbalized understanding and agreed with it.  Status is: Inpatient Remains inpatient appropriate because: Dropping hemoglobin, still with ascites and elevated creatinine.   Estimated body mass index is 32.27 kg/m as calculated from the following:   Height as of this encounter: 5\' 10"  (1.778 m).   Weight as of this encounter: 102 kg.    Nutritional Assessment: Body mass index is 32.27 kg/m.Aaron Aas Seen by dietician.  I agree with the assessment and plan as outlined below: Nutrition Status:        . Skin Assessment: I have examined the patient's skin and I agree with the wound assessment as performed by the wound care RN as outlined below:    Consultants:  GI  Procedures:  As above  Antimicrobials:  Anti-infectives (From admission, onward)    None         Subjective: Patient seen and examined, he is fully alert and oriented, he is complaining of pain all over the body.  No other complaint.  Objective: Vitals:   04/17/24 2025 04/18/24 0035 04/18/24 0527 04/18/24 0724  BP: (!) 150/82 (!) 153/92 (!) 150/78 (!) 153/74  Pulse: 77 75 77 74  Resp: 18 18 18 16   Temp: 98.1 F (36.7 C) 98.1 F (36.7 C) 98.9 F (37.2 C) 99.4 F (37.4 C)  TempSrc: Oral Oral Oral Oral  SpO2: 98% 99% 100% 100%  Weight:      Height:        Intake/Output Summary (Last 24 hours) at 04/18/2024 1353 Last data filed at 04/18/2024 1000 Gross per 24 hour  Intake 899.04 ml  Output 800 ml  Net 99.04 ml   Filed Weights   04/16/24  2218  Weight: 102 kg    Examination:  General exam: Appears calm and comfortable, cachectic Respiratory system: Clear to auscultation. Respiratory effort normal. Cardiovascular system: S1 & S2 heard, RRR. No JVD, murmurs, rubs, gallops or clicks. No pedal edema. Gastrointestinal system: Abdomen is soft, distended with positive fluid shift, nontender. No organomegaly or masses felt. Normal bowel sounds heard. Central nervous system: Alert and oriented. No focal neurological deficits. Extremities: Symmetric 5 x 5 power. Skin: No rashes, lesions or ulcers Psychiatry: Judgement and insight appear normal. Mood & affect appropriate.    Data Reviewed: I have personally reviewed following labs and imaging studies  CBC: Recent Labs  Lab 04/16/24 2222 04/17/24 1249 04/18/24 0450  WBC 8.5 8.8 8.9  NEUTROABS 5.4  --  4.5  HGB 8.5* 8.9* 7.6*  HCT 25.4* 26.0* 21.9*  MCV 69.0* 67.5* 67.6*  PLT 143* 155 143*   Basic Metabolic Panel: Recent Labs  Lab 04/16/24 2222 04/17/24 1249 04/18/24 0450  NA 134*  --  136  K 4.2  --  3.5  CL 107  --  111  CO2 16*  --  19*  GLUCOSE 148*  --  113*  BUN 29*  --  30*  CREATININE 2.37* 2.26* 2.24*  CALCIUM  8.1*  --  8.1*  MG  --   --  2.3   GFR: Estimated Creatinine Clearance: 48.8 mL/min (A) (by C-G formula based on SCr of 2.24 mg/dL (H)). Liver Function Tests: Recent Labs  Lab 04/16/24 2222 04/18/24 0450  AST 206* 179*  ALT 15 16  ALKPHOS 145* 115  BILITOT 18.8* 17.0*  PROT 6.2* 6.1*  ALBUMIN  2.6* 2.8*   No results for input(s): "LIPASE", "AMYLASE" in the last 168 hours. Recent Labs  Lab 04/16/24 2222 04/18/24 1208  AMMONIA 89* 60*   Coagulation Profile: Recent Labs  Lab 04/16/24 2222 04/18/24 0450  INR 1.6* 1.7*   Cardiac Enzymes: No results for input(s): "CKTOTAL", "CKMB", "CKMBINDEX", "TROPONINI" in the last 168 hours. BNP (last 3 results) No results for input(s): "PROBNP" in the last 8760 hours. HbA1C: Recent Labs     04/17/24 1249  HGBA1C 4.7*   CBG: Recent Labs  Lab 04/17/24 1247 04/17/24 1636 04/17/24 2022 04/18/24 0802 04/18/24 1147  GLUCAP 105* 152* 158* 108* 112*   Lipid Profile: No results for input(s): "CHOL", "HDL", "LDLCALC", "TRIG", "CHOLHDL", "LDLDIRECT" in the last 72 hours. Thyroid Function Tests: No results for input(s): "TSH", "T4TOTAL", "FREET4", "T3FREE", "THYROIDAB" in the last 72 hours. Anemia Panel: Recent Labs    04/17/24 1249  TIBC 144*  IRON 117   Sepsis Labs: Recent Labs  Lab 04/18/24 1243  LATICACIDVEN 1.9    Recent Results (from the past 240 hours)  Culture, body fluid w Gram Stain-bottle     Status: None (Preliminary result)   Collection Time: 04/17/24 11:52 AM   Specimen: Peritoneal Washings  Result Value Ref Range Status   Specimen Description PERITONEAL  Final   Special Requests NONE  Final   Culture   Final    NO GROWTH < 24 HOURS Performed at [facility], [address], [place], [place] [address]    Report Status PENDING  Incomplete  Gram stain     Status: None   Collection Time: 04/17/24 11:52 AM   Specimen: Peritoneal Washings  Result Value Ref Range Status   Specimen Description PERITONEAL  Final   Special Requests NONE  Final   Gram Stain   Final    WBC PRESENT, PREDOMINANTLY MONONUCLEAR NO ORGANISMS SEEN CYTOSPIN SMEAR Performed at [facility], [address], [place], [place] [address]    Report Status 04/17/2024 FINAL  Final  Culture, blood (Routine X 2) w Reflex to ID Panel     Status: None (Preliminary result)   Collection Time: 04/17/24 12:49 PM   Specimen: BLOOD LEFT HAND  Result Value Ref Range Status   Specimen Description BLOOD LEFT HAND  Final   Special Requests   Final    BOTTLES DRAWN AEROBIC AND ANAEROBIC Blood Culture adequate volume   Culture   Final    NO GROWTH < 24 HOURS Performed at [facility], [address] [place], [place] [address]    Report Status PENDING  Incomplete   Culture, blood (Routine X 2) w Reflex to ID Panel     Status: None (Preliminary result)   Collection Time: 04/17/24 12:49 PM   Specimen: BLOOD RIGHT HAND  Result Value Ref Range Status   Specimen Description BLOOD RIGHT HAND  Final   Special Requests   Final  BOTTLES DRAWN AEROBIC AND ANAEROBIC Blood Culture adequate volume   Culture   Final    NO GROWTH < 24 HOURS Performed at [facility], [address] [place], [place] [address]    Report Status PENDING  Incomplete     Radiology Studies: US  Paracentesis Result Date: 04/18/2024 INDICATION: Hepatitis-B with cirrhosis and recurrent ascites. Request for diagnostic and therapeutic paracentesis. EXAM: ULTRASOUND GUIDED PARACENTESIS MEDICATIONS: 1% lidocaine  10 mL COMPLICATIONS: None immediate. PROCEDURE: Informed written consent was obtained from the patient after a discussion of the risks, benefits and alternatives to treatment. A timeout was performed prior to the initiation of the procedure. Initial ultrasound scanning demonstrates a moderate amount of ascites within the right lateral abdomen. The right lateral abdomen was prepped and draped in the usual sterile fashion. 1% lidocaine  was used for local anesthesia. Following this, a 19 gauge, 7-cm, Yueh catheter was introduced. An ultrasound image was saved for documentation purposes. The paracentesis was performed. The catheter was removed and a dressing was applied. The patient tolerated the procedure well without immediate post procedural complication. FINDINGS: A total of approximately 3 L of clear gold fluid was removed. Samples were sent to the laboratory as requested by the clinical team. IMPRESSION: Successful ultrasound-guided paracentesis yielding 3 liters of peritoneal fluid. Procedure performed by: Nicky Barrack, PA-C Electronically Signed   By: Erica Hau M.D.   On: 04/18/2024 07:58   CT HEAD WO CONTRAST ( ) Result Date: 04/16/2024 CLINICAL DATA:  Altered mental  status. EXAM: CT HEAD WITHOUT CONTRAST TECHNIQUE: Contiguous axial images were obtained from the base of the skull through the vertex without intravenous contrast. RADIATION DOSE REDUCTION: This exam was performed according to the departmental dose-optimization program which includes automated exposure control, adjustment of the mA and/or kV according to patient size and/or use of iterative reconstruction technique. COMPARISON:  February 16, 2024 FINDINGS: Brain: No evidence of acute infarction, hemorrhage, hydrocephalus, extra-axial collection or mass lesion/mass effect. Vascular: No hyperdense vessel or unexpected calcification. Skull: A posterior parietal craniotomy defect is seen along the vertex. Sinuses/Orbits: No acute finding. Other: A 1.7 cm x 2.8 cm well-defined area of low attenuation (approximately 10.93 Hounsfield units is seen within the scalp soft tissues of the posterior fossa, slightly to the right of midline. An ill-defined, 1.2 cm x 1.4 cm area of low attenuation (approximately 47.65 Hounsfield units) is seen within this region on the prior study. IMPRESSION: 1. No acute intracranial abnormality. 2. Findings which may represent a small scalp soft tissue abscess versus seroma. Correlation with physical examination is recommended. Electronically Signed   By: Virgle Grime M.D.   On: 04/16/2024 23:53   DG Chest Port 1 View Result Date: 04/16/2024 CLINICAL DATA:  AMS EXAM: PORTABLE CHEST 1 VIEW COMPARISON:  None Available. FINDINGS: Prominent cardiac silhouette likely exaggerated by AP portable technique and low lung volumes. The heart and mediastinal contours are grossly unchanged. Low lung volumes. No focal consolidation. No pulmonary edema. No pleural effusion. No pneumothorax. No acute osseous abnormality. IMPRESSION: Low lung volumes with no active disease. Electronically Signed   By: Morgane  Naveau M.D.   On: 04/16/2024 22:45    Scheduled Meds:  heparin  5,000 Units Subcutaneous Q8H    insulin  aspart  0-15 Units Subcutaneous TID WC   insulin  aspart  0-5 Units Subcutaneous QHS   lactulose   20 g Oral BID   Continuous Infusions:  albumin  human 25 g (04/18/24 1308)     LOS: 1 day   [place]  Felipa Laroche, MD [facility]  04/18/2024, 1:53 PM   *Please note that this is a verbal dictation therefore any spelling or grammatical errors are due to the "[employer]agon [employer] One" system interpretation.  Please page via Amion and do not message via secure chat for urgent patient care matters. Secure chat can be used for non urgent patient care matters.  How to contact the [facility] Attending or Consulting provider 7A - 7P or covering provider during after hours 7P -7A, for this patient?  Check the care team in [facility] and look for a) attending/consulting [facility] provider listed and b) the [facility] team listed. Page or secure chat 7A-7P. Log into [email or web address] and use [place] universal password to access. If you do not have the password, please contact the hospital operator. Locate the [facility] provider you are looking for under [facility] and page to a number that you can be directly reached. If you still have difficulty reaching the provider, please page the [facility] (Director on Call) for the Hospitalists listed on amion for assistance.

## 2024-04-19 DIAGNOSIS — Z743 Need for continuous supervision: Secondary | ICD-10-CM | POA: Diagnosis not present

## 2024-04-19 DIAGNOSIS — N179 Acute kidney failure, unspecified: Secondary | ICD-10-CM | POA: Diagnosis present

## 2024-04-19 DIAGNOSIS — K7460 Unspecified cirrhosis of liver: Secondary | ICD-10-CM | POA: Diagnosis present

## 2024-04-19 DIAGNOSIS — I10 Essential (primary) hypertension: Secondary | ICD-10-CM | POA: Diagnosis not present

## 2024-04-19 DIAGNOSIS — K7682 Hepatic encephalopathy: Secondary | ICD-10-CM | POA: Diagnosis present

## 2024-04-19 DIAGNOSIS — R1084 Generalized abdominal pain: Secondary | ICD-10-CM | POA: Diagnosis not present

## 2024-04-19 DIAGNOSIS — R17 Unspecified jaundice: Secondary | ICD-10-CM | POA: Diagnosis present

## 2024-04-19 NOTE — Plan of Care (Signed)

## 2024-04-19 NOTE — Progress Notes (Signed)
 Patient transferred to [facility] ([address]).

## 2024-04-21 LAB — CYTOLOGY - NON PAP

## 2024-04-22 LAB — CULTURE, BLOOD (ROUTINE X 2)
Culture: NO GROWTH
Culture: NO GROWTH
Special Requests: ADEQUATE
Special Requests: ADEQUATE

## 2024-04-22 LAB — CULTURE, BODY FLUID W GRAM STAIN -BOTTLE: Culture: NO GROWTH

## 2024-04-26 ENCOUNTER — Ambulatory Visit: Payer: Self-pay | Admitting: Physician Assistant
